# Patient Record
Sex: Female | Born: 1970 | Race: Black or African American | Hispanic: No | Marital: Single | Smoking: Never smoker
Health system: Southern US, Community
[De-identification: ages and names within clinical notes are randomized; demographics above are authoritative.]

## PROBLEM LIST (undated history)

## (undated) DIAGNOSIS — F1010 Alcohol abuse, uncomplicated: Secondary | ICD-10-CM

## (undated) DIAGNOSIS — F1994 Other psychoactive substance use, unspecified with psychoactive substance-induced mood disorder: Secondary | ICD-10-CM

## (undated) DIAGNOSIS — D649 Anemia, unspecified: Secondary | ICD-10-CM

## (undated) DIAGNOSIS — F0630 Mood disorder due to known physiological condition, unspecified: Secondary | ICD-10-CM

## (undated) DIAGNOSIS — T50901A Poisoning by unspecified drugs, medicaments and biological substances, accidental (unintentional), initial encounter: Secondary | ICD-10-CM

## (undated) DIAGNOSIS — J9600 Acute respiratory failure, unspecified whether with hypoxia or hypercapnia: Secondary | ICD-10-CM

## (undated) DIAGNOSIS — F329 Major depressive disorder, single episode, unspecified: Secondary | ICD-10-CM

## (undated) DIAGNOSIS — F32A Depression, unspecified: Secondary | ICD-10-CM

## (undated) DIAGNOSIS — T1491XA Suicide attempt, initial encounter: Secondary | ICD-10-CM

## (undated) HISTORY — PX: AUGMENTATION MAMMAPLASTY: SUR837

## (undated) HISTORY — PX: COSMETIC SURGERY: SHX468

---

## 1998-02-16 ENCOUNTER — Other Ambulatory Visit: Admission: RE | Admit: 1998-02-16 | Discharge: 1998-02-16 | Payer: Self-pay | Admitting: *Deleted

## 2010-08-14 ENCOUNTER — Emergency Department (HOSPITAL_COMMUNITY)
Admission: EM | Admit: 2010-08-14 | Discharge: 2010-08-15 | Payer: Self-pay | Source: Home / Self Care | Admitting: Emergency Medicine

## 2010-08-26 ENCOUNTER — Emergency Department (HOSPITAL_COMMUNITY): Admission: EM | Admit: 2010-08-26 | Discharge: 2010-08-26 | Payer: Self-pay | Admitting: Emergency Medicine

## 2011-01-17 LAB — COMPREHENSIVE METABOLIC PANEL
ALT: 79 U/L — ABNORMAL HIGH (ref 0–35)
AST: 109 U/L — ABNORMAL HIGH (ref 0–37)
Albumin: 4.5 g/dL (ref 3.5–5.2)
Alkaline Phosphatase: 48 U/L (ref 39–117)
BUN: 13 mg/dL (ref 6–23)
CO2: 31 mEq/L (ref 19–32)
Calcium: 9.1 mg/dL (ref 8.4–10.5)
Chloride: 99 mEq/L (ref 96–112)
Creatinine, Ser: 0.79 mg/dL (ref 0.4–1.2)
GFR calc Af Amer: >60 mL/min (ref >60)
GFR calc non Af Amer: >60 mL/min (ref >60)
Glucose, Bld: 85 mg/dL (ref 70–99)
Potassium: 3.8 mEq/L (ref 3.5–5.1)
Sodium: 142 mEq/L (ref 135–145)
Total Bilirubin: 1.9 mg/dL — ABNORMAL HIGH (ref 0.3–1.2)
Total Protein: 7.8 g/dL (ref 6.0–8.3)

## 2011-01-17 LAB — DIFFERENTIAL
Basophils Absolute: 0 10*3/uL (ref 0.0–0.1)
Basophils Relative: 0 % (ref 0–1)
Eosinophils Absolute: 0 10*3/uL (ref 0.0–0.7)
Eosinophils Relative: 0 % (ref 0–5)
Lymphocytes Relative: 22 % (ref 12–46)
Lymphs Abs: 1 10*3/uL (ref 0.7–4.0)
Monocytes Absolute: 0.5 10*3/uL (ref 0.1–1.0)
Monocytes Relative: 11 % (ref 3–12)
Neutro Abs: 3.1 10*3/uL (ref 1.7–7.7)
Neutrophils Relative %: 67 % (ref 43–77)

## 2011-01-17 LAB — ETHANOL: Alcohol, Ethyl (B): 9 mg/dL (ref 0–10)

## 2011-01-17 LAB — CBC
HCT: 36.5 % (ref 36.0–46.0)
Hemoglobin: 12.6 g/dL (ref 12.0–15.0)
MCH: 33.1 pg (ref 26.0–34.0)
MCHC: 34.5 g/dL (ref 30.0–36.0)
MCV: 95.8 fL (ref 78.0–100.0)
Platelets: 245 10*3/uL (ref 150–400)
RBC: 3.81 MIL/uL — ABNORMAL LOW (ref 3.87–5.11)
RDW: 13.2 % (ref 11.5–15.5)
WBC: 4.7 10*3/uL (ref 4.0–10.5)

## 2011-01-17 LAB — RAPID URINE DRUG SCREEN, HOSP PERFORMED
Amphetamines: NOT DETECTED
Barbiturates: NOT DETECTED
Benzodiazepines: NOT DETECTED
Cocaine: NOT DETECTED
Opiates: NOT DETECTED
Tetrahydrocannabinol: NOT DETECTED

## 2011-01-18 LAB — DIFFERENTIAL
Basophils Absolute: 0 10*3/uL (ref 0.0–0.1)
Basophils Relative: 1 % (ref 0–1)
Eosinophils Absolute: 0.1 10*3/uL (ref 0.0–0.7)
Eosinophils Relative: 2 % (ref 0–5)
Lymphocytes Relative: 50 % — ABNORMAL HIGH (ref 12–46)
Lymphs Abs: 1.7 10*3/uL (ref 0.7–4.0)
Monocytes Absolute: 0.5 10*3/uL (ref 0.1–1.0)
Monocytes Relative: 13 % — ABNORMAL HIGH (ref 3–12)
Neutro Abs: 1.2 10*3/uL — ABNORMAL LOW (ref 1.7–7.7)
Neutrophils Relative %: 35 % — ABNORMAL LOW (ref 43–77)

## 2011-01-18 LAB — RAPID URINE DRUG SCREEN, HOSP PERFORMED
Amphetamines: NOT DETECTED
Barbiturates: NOT DETECTED
Benzodiazepines: NOT DETECTED
Cocaine: NOT DETECTED
Opiates: NOT DETECTED
Tetrahydrocannabinol: NOT DETECTED

## 2011-01-18 LAB — POCT I-STAT, CHEM 8
BUN: 11 mg/dL (ref 6–23)
Calcium, Ion: 1.1 mmol/L — ABNORMAL LOW (ref 1.12–1.32)
Chloride: 116 mEq/L — ABNORMAL HIGH (ref 96–112)
Creatinine, Ser: 1.3 mg/dL — ABNORMAL HIGH (ref 0.4–1.2)
Glucose, Bld: 87 mg/dL (ref 70–99)
HCT: 33 % — ABNORMAL LOW (ref 36.0–46.0)
Hemoglobin: 11.2 g/dL — ABNORMAL LOW (ref 12.0–15.0)
Potassium: 3.6 mEq/L (ref 3.5–5.1)
Sodium: 151 mEq/L — ABNORMAL HIGH (ref 135–145)
TCO2: 21 mmol/L (ref 0–100)

## 2011-01-18 LAB — CBC
HCT: 32.4 % — ABNORMAL LOW (ref 36.0–46.0)
Hemoglobin: 10.9 g/dL — ABNORMAL LOW (ref 12.0–15.0)
MCH: 33.2 pg (ref 26.0–34.0)
MCHC: 33.6 g/dL (ref 30.0–36.0)
MCV: 98.8 fL (ref 78.0–100.0)
Platelets: 198 10*3/uL (ref 150–400)
RBC: 3.28 MIL/uL — ABNORMAL LOW (ref 3.87–5.11)
RDW: 13.1 % (ref 11.5–15.5)
WBC: 3.5 10*3/uL — ABNORMAL LOW (ref 4.0–10.5)

## 2011-01-18 LAB — BASIC METABOLIC PANEL
BUN: 9 mg/dL (ref 6–23)
CO2: 23 mEq/L (ref 19–32)
Calcium: 8.1 mg/dL — ABNORMAL LOW (ref 8.4–10.5)
Chloride: 120 mEq/L — ABNORMAL HIGH (ref 96–112)
Creatinine, Ser: 0.74 mg/dL (ref 0.4–1.2)
GFR calc Af Amer: >60 mL/min (ref >60)
GFR calc non Af Amer: >60 mL/min (ref >60)
Glucose, Bld: 88 mg/dL (ref 70–99)
Potassium: 3.7 mEq/L (ref 3.5–5.1)
Sodium: 147 mEq/L — ABNORMAL HIGH (ref 135–145)

## 2011-01-18 LAB — ETHANOL
Alcohol, Ethyl (B): 240 mg/dL — ABNORMAL HIGH (ref 0–10)
Alcohol, Ethyl (B): 347 mg/dL — ABNORMAL HIGH (ref 0–10)

## 2011-01-18 LAB — TRICYCLICS SCREEN, URINE: TCA Scrn: NOT DETECTED

## 2011-05-02 ENCOUNTER — Emergency Department (HOSPITAL_COMMUNITY)
Admission: EM | Admit: 2011-05-02 | Discharge: 2011-05-02 | Disposition: A | Discharge status: Home / Self Care | Payer: Medicaid Other | Attending: Emergency Medicine | Admitting: Emergency Medicine

## 2011-05-02 DIAGNOSIS — R110 Nausea: Secondary | ICD-10-CM | POA: Insufficient documentation

## 2011-05-02 DIAGNOSIS — K089 Disorder of teeth and supporting structures, unspecified: Secondary | ICD-10-CM | POA: Insufficient documentation

## 2011-05-02 DIAGNOSIS — K047 Periapical abscess without sinus: Secondary | ICD-10-CM | POA: Insufficient documentation

## 2011-05-02 DIAGNOSIS — R220 Localized swelling, mass and lump, head: Secondary | ICD-10-CM | POA: Insufficient documentation

## 2011-07-28 ENCOUNTER — Emergency Department (HOSPITAL_COMMUNITY)
Admission: EM | Admit: 2011-07-28 | Discharge: 2011-07-28 | Disposition: A | Discharge status: Home / Self Care | Payer: Medicaid Other | Attending: Emergency Medicine | Admitting: Emergency Medicine

## 2011-07-28 DIAGNOSIS — D72819 Decreased white blood cell count, unspecified: Secondary | ICD-10-CM | POA: Insufficient documentation

## 2011-07-28 DIAGNOSIS — F1010 Alcohol abuse, uncomplicated: Secondary | ICD-10-CM | POA: Insufficient documentation

## 2011-07-28 LAB — RAPID URINE DRUG SCREEN, HOSP PERFORMED
Amphetamines: NOT DETECTED
Barbiturates: NOT DETECTED
Benzodiazepines: NOT DETECTED
Cocaine: NOT DETECTED
Opiates: NOT DETECTED
Tetrahydrocannabinol: NOT DETECTED

## 2011-07-28 LAB — ETHANOL: Alcohol, Ethyl (B): 284 mg/dL — ABNORMAL HIGH (ref 0–11)

## 2011-07-28 LAB — CBC
HCT: 34.8 % — ABNORMAL LOW (ref 36.0–46.0)
Hemoglobin: 11.7 g/dL — ABNORMAL LOW (ref 12.0–15.0)
MCH: 32.2 pg (ref 26.0–34.0)
MCHC: 33.6 g/dL (ref 30.0–36.0)
MCV: 95.9 fL (ref 78.0–100.0)
Platelets: 253 10*3/uL (ref 150–400)
RBC: 3.63 MIL/uL — ABNORMAL LOW (ref 3.87–5.11)
RDW: 14.2 % (ref 11.5–15.5)
WBC: 3.3 10*3/uL — ABNORMAL LOW (ref 4.0–10.5)

## 2011-07-28 LAB — BASIC METABOLIC PANEL
BUN: 10 mg/dL (ref 6–23)
CO2: 24 mEq/L (ref 19–32)
Calcium: 9.1 mg/dL (ref 8.4–10.5)
Chloride: 106 mEq/L (ref 96–112)
Creatinine, Ser: 0.75 mg/dL (ref 0.50–1.10)
GFR calc Af Amer: >60 mL/min (ref >60)
GFR calc non Af Amer: >60 mL/min (ref >60)
Glucose, Bld: 103 mg/dL — ABNORMAL HIGH (ref 70–99)
Potassium: 4 mEq/L (ref 3.5–5.1)
Sodium: 144 mEq/L (ref 135–145)

## 2011-07-28 LAB — DIFFERENTIAL
Basophils Absolute: 0 10*3/uL (ref 0.0–0.1)
Basophils Relative: 0 % (ref 0–1)
Eosinophils Absolute: 0.1 10*3/uL (ref 0.0–0.7)
Eosinophils Relative: 2 % (ref 0–5)
Lymphocytes Relative: 46 % (ref 12–46)
Lymphs Abs: 1.5 10*3/uL (ref 0.7–4.0)
Monocytes Absolute: 0.3 10*3/uL (ref 0.1–1.0)
Monocytes Relative: 9 % (ref 3–12)
Neutro Abs: 1.4 10*3/uL — ABNORMAL LOW (ref 1.7–7.7)
Neutrophils Relative %: 42 % — ABNORMAL LOW (ref 43–77)

## 2011-07-28 LAB — URINALYSIS, ROUTINE W REFLEX MICROSCOPIC
Bilirubin Urine: NEGATIVE
Glucose, UA: NEGATIVE mg/dL
Hgb urine dipstick: NEGATIVE
Ketones, ur: NEGATIVE mg/dL
Leukocytes, UA: NEGATIVE
Nitrite: NEGATIVE
Protein, ur: NEGATIVE mg/dL
Specific Gravity, Urine: 1.014 (ref 1.005–1.030)
Urobilinogen, UA: 0.2 mg/dL (ref 0.0–1.0)
pH: 5.5 (ref 5.0–8.0)

## 2011-07-28 LAB — POCT PREGNANCY, URINE
Preg Test, Ur: NEGATIVE
Preg Test, Ur: NEGATIVE

## 2011-12-28 ENCOUNTER — Emergency Department (HOSPITAL_COMMUNITY)
Admission: EM | Admit: 2011-12-28 | Discharge: 2011-12-29 | Disposition: A | Discharge status: Home / Self Care | Payer: Medicaid Other | Attending: Emergency Medicine | Admitting: Emergency Medicine

## 2011-12-28 ENCOUNTER — Encounter (HOSPITAL_COMMUNITY): Payer: Self-pay | Admitting: *Deleted

## 2011-12-28 DIAGNOSIS — F329 Major depressive disorder, single episode, unspecified: Secondary | ICD-10-CM | POA: Insufficient documentation

## 2011-12-28 DIAGNOSIS — F32A Depression, unspecified: Secondary | ICD-10-CM

## 2011-12-28 DIAGNOSIS — F10929 Alcohol use, unspecified with intoxication, unspecified: Secondary | ICD-10-CM

## 2011-12-28 DIAGNOSIS — F3289 Other specified depressive episodes: Secondary | ICD-10-CM | POA: Insufficient documentation

## 2011-12-28 DIAGNOSIS — F1010 Alcohol abuse, uncomplicated: Secondary | ICD-10-CM | POA: Insufficient documentation

## 2011-12-28 HISTORY — DX: Alcohol abuse, uncomplicated: F10.10

## 2011-12-28 HISTORY — DX: Depression, unspecified: F32.A

## 2011-12-28 HISTORY — DX: Suicide attempt, initial encounter: T14.91XA

## 2011-12-28 HISTORY — DX: Major depressive disorder, single episode, unspecified: F32.9

## 2011-12-28 LAB — POCT I-STAT, CHEM 8
BUN: 15 mg/dL (ref 6–23)
Calcium, Ion: 1.05 mmol/L — ABNORMAL LOW (ref 1.12–1.32)
Chloride: 111 mEq/L (ref 96–112)
Creatinine, Ser: 1.3 mg/dL — ABNORMAL HIGH (ref 0.50–1.10)
Glucose, Bld: 93 mg/dL (ref 70–99)
HCT: 47 % — ABNORMAL HIGH (ref 36.0–46.0)
Hemoglobin: 16 g/dL — ABNORMAL HIGH (ref 12.0–15.0)
Potassium: 3.9 mEq/L (ref 3.5–5.1)
Sodium: 152 mEq/L — ABNORMAL HIGH (ref 135–145)
TCO2: 28 mmol/L (ref 0–100)

## 2011-12-28 MED ORDER — SODIUM CHLORIDE 0.9 % IV BOLUS (SEPSIS)
1000.0000 mL | Freq: Once | INTRAVENOUS | Status: AC
  Administered 2011-12-28: 1000 mL via INTRAVENOUS

## 2011-12-28 NOTE — ED Notes (Signed)
Pt presents with her mother.  Pt is not making comprehensible statements but is protecting airway well.  Pt's mother responded to a telephone call from the pt who was found to be intoxicated and lying in the back of a truck.  Pt is here tonight for detox from ETOH.  Pt's mother states that the pt has not been sober in at least 1 week and that she has a hx of binge drinking.

## 2011-12-28 NOTE — ED Provider Notes (Addendum)
History     CSN: [id number]  Arrival date & time 12/28/11  2226   First MD Initiated Contact with Patient 12/28/11 2257      Chief Complaint  Patient presents with  . Alcohol Intoxication    (Consider location/radiation/quality/duration/timing/severity/associated sxs/prior treatment) HPI Comments: Pt has longstanding hx of alcohol abuse.  Brought in tonight after mom called police when she received a phone call from pt stating that she was lying in her truck and felt like she was going to die.  Pt has told several staff members that she wants to die.  Has been through detox in past many times.  Has also had hx of depression with past suicide attempts when she was a teenager per mom.  Pt denies any physical complaints or recent injuries.  Drinks daily, usually wine.  Denies drug use.  Patient is a 40 y.o. female presenting with intoxication. The history is provided by the patient and a parent.  Alcohol Intoxication This is a recurrent problem. The current episode started 6 to 12 hours ago. The problem occurs constantly. Pertinent negatives include no chest pain, no abdominal pain, no headaches and no shortness of breath.    Past Medical History  Diagnosis Date  . Alcohol abuse   . Depression   . Suicide attempt     Past Surgical History  Procedure Date  . Cosmetic surgery     No family history on file.  History  Substance Use Topics  . Smoking status: Not on file  . Smokeless tobacco: Not on file  . Alcohol Use: Yes     last drank today an unknown amount.    OB History    Grav Para Term Preterm Abortions TAB SAB Ect Mult Living                  Review of Systems  Constitutional: Negative for fever, chills, diaphoresis and fatigue.  HENT: Negative for congestion, rhinorrhea and sneezing.   Eyes: Negative.   Respiratory: Negative for cough, chest tightness and shortness of breath.   Cardiovascular: Negative for chest pain and leg swelling.  Gastrointestinal:  Negative for nausea, vomiting, abdominal pain, diarrhea and blood in stool.  Genitourinary: Negative for frequency, hematuria, flank pain and difficulty urinating.  Musculoskeletal: Negative for back pain and arthralgias.  Skin: Negative for rash.  Neurological: Negative for dizziness, speech difficulty, weakness, numbness and headaches.    Allergies  Review of patient's allergies indicates no known allergies.  Home Medications  No current outpatient prescriptions on file.  BP 112/72  Pulse 103  Temp(Src) 98.7 F (37.1 C) (Oral)  Resp 19  SpO2 96%  LMP 09/27/2011  Physical Exam  Constitutional: She appears well-developed and well-nourished.       Sleepy, but wakes easily, no airway compromise, answers questions slowly, ETOH smell on breath  HENT:  Head: Normocephalic and atraumatic.  Eyes: Pupils are equal, round, and reactive to light.  Neck: Normal range of motion. Neck supple.  Cardiovascular: Normal rate, regular rhythm and normal heart sounds.   Pulmonary/Chest: Effort normal and breath sounds normal. No respiratory distress. She has no wheezes. She has no rales. She exhibits no tenderness.  Abdominal: Soft. Bowel sounds are normal. There is no tenderness. There is no rebound and no guarding.  Musculoskeletal: Normal range of motion. She exhibits no edema.  Lymphadenopathy:    She has no cervical adenopathy.  Neurological: She is alert. She has normal strength. She is disoriented. No cranial nerve deficit  or sensory deficit. GCS eye subscore is 4. GCS verbal subscore is 4. GCS motor subscore is 6.  Skin: Skin is warm and dry. No rash noted.  Psychiatric: She has a normal mood and affect.    ED Course  Procedures (including critical care time)  Results for orders placed during the hospital encounter of 12/28/11  ETHANOL      Component Value Range   Alcohol, Ethyl (B) 470 (*) 0 - 11 (mg/dL)  URINE RAPID DRUG SCREEN (HOSP PERFORMED)      Component Value Range    Opiates NONE DETECTED  NONE DETECTED    Cocaine NONE DETECTED  NONE DETECTED    Benzodiazepines NONE DETECTED  NONE DETECTED    Amphetamines NONE DETECTED  NONE DETECTED    Tetrahydrocannabinol NONE DETECTED  NONE DETECTED    Barbiturates NONE DETECTED  NONE DETECTED   ACETAMINOPHEN LEVEL      Component Value Range   Acetaminophen (Tylenol), Serum <15.0  10 - 30 (ug/mL)  POCT I-STAT, CHEM 8      Component Value Range   Sodium 152 (*) 135 - 145 (mEq/L)   Potassium 3.9  3.5 - 5.1 (mEq/L)   Chloride 111  96 - 112 (mEq/L)   BUN 15  6 - 23 (mg/dL)   Creatinine, Ser 1.61 (*) 0.50 - 1.10 (mg/dL)   Glucose, Bld 93  70 - 99 (mg/dL)   Calcium, Ion 0.96 (*) 1.12 - 1.32 (mmol/L)   TCO2 28  0 - 100 (mmol/L)   Hemoglobin 16.0 (*) 12.0 - 15.0 (g/dL)   HCT 04.5 (*) 40.9 - 46.0 (%)  POCT PREGNANCY, URINE      Component Value Range   Preg Test, Ur NEGATIVE  NEGATIVE     Date: 12/29/2011  Rate: 89  Rhythm: normal sinus rhythm  QRS Axis: normal  Intervals: normal  ST/T Wave abnormalities: normal and nonspecific ST changes  Conduction Disutrbances:none  Narrative Interpretation:   Old EKG Reviewed: none available   No results found.   1. Alcohol intoxication   2. Depression       MDM  Pt intoxicated.  Will wait until more sober and reevaluate for SI/self harm.  CIWA protocol initiated.        Rolan Bucco, MD 12/29/11 8119  Rolan Bucco, MD 12/29/11 [id number]

## 2011-12-28 NOTE — ED Notes (Signed)
MD at bedside. 

## 2011-12-28 NOTE — ED Notes (Signed)
Pt is cussing and verbally abusive at triage window while checking in.

## 2011-12-28 NOTE — ED Notes (Signed)
Per Mother: Pt has been intoxicated for 1 week, worse today. Pt came to ED with vomit and urine on clothing. Impulsive but reorients and cooperates. Mother at bedside. No other complaint.

## 2011-12-29 LAB — RAPID URINE DRUG SCREEN, HOSP PERFORMED
Amphetamines: NOT DETECTED
Barbiturates: NOT DETECTED
Benzodiazepines: NOT DETECTED
Cocaine: NOT DETECTED
Opiates: NOT DETECTED
Tetrahydrocannabinol: NOT DETECTED

## 2011-12-29 LAB — POCT I-STAT, CHEM 8
BUN: 13 mg/dL (ref 6–23)
Calcium, Ion: 0.98 mmol/L — ABNORMAL LOW (ref 1.12–1.32)
Chloride: 112 mEq/L (ref 96–112)
Creatinine, Ser: 1.2 mg/dL — ABNORMAL HIGH (ref 0.50–1.10)
Glucose, Bld: 94 mg/dL (ref 70–99)
HCT: 35 % — ABNORMAL LOW (ref 36.0–46.0)
Hemoglobin: 11.9 g/dL — ABNORMAL LOW (ref 12.0–15.0)
Potassium: 3.7 mEq/L (ref 3.5–5.1)
Sodium: 148 mEq/L — ABNORMAL HIGH (ref 135–145)
TCO2: 24 mmol/L (ref 0–100)

## 2011-12-29 LAB — ACETAMINOPHEN LEVEL: Acetaminophen (Tylenol), Serum: <15 ug/mL (ref 10–30)

## 2011-12-29 LAB — ETHANOL
Alcohol, Ethyl (B): 180 mg/dL — ABNORMAL HIGH (ref 0–11)
Alcohol, Ethyl (B): 329 mg/dL — ABNORMAL HIGH (ref 0–11)
Alcohol, Ethyl (B): 470 mg/dL (ref 0–11)

## 2011-12-29 LAB — POCT PREGNANCY, URINE: Preg Test, Ur: NEGATIVE

## 2011-12-29 MED ORDER — ADULT MULTIVITAMIN W/MINERALS CH
1.0000 | ORAL_TABLET | Freq: Every day | ORAL | Status: DC
  Administered 2011-12-29: 1 via ORAL
  Filled 2011-12-29 (×3): qty 1

## 2011-12-29 MED ORDER — THIAMINE HCL 100 MG/ML IJ SOLN: 100.0000 mg | Freq: Every day | INTRAMUSCULAR | Status: DC

## 2011-12-29 MED ORDER — LORAZEPAM 2 MG/ML IJ SOLN
1.0000 mg | Freq: Four times a day (QID) | INTRAMUSCULAR | Status: DC | PRN
  Administered 2011-12-29 (×2): 1 mg via INTRAVENOUS
  Filled 2011-12-29 (×2): qty 1

## 2011-12-29 MED ORDER — ONDANSETRON HCL 4 MG/2ML IJ SOLN
4.0000 mg | Freq: Four times a day (QID) | INTRAMUSCULAR | Status: DC | PRN
  Administered 2011-12-29 (×2): 4 mg via INTRAVENOUS
  Filled 2011-12-29 (×2): qty 2

## 2011-12-29 MED ORDER — VITAMIN B-1 100 MG PO TABS
100.0000 mg | ORAL_TABLET | Freq: Every day | ORAL | Status: DC
  Administered 2011-12-29: 100 mg via ORAL
  Filled 2011-12-29: qty 1

## 2011-12-29 MED ORDER — LORAZEPAM 1 MG PO TABS
1.0000 mg | ORAL_TABLET | Freq: Four times a day (QID) | ORAL | Status: DC | PRN
  Filled 2011-12-29: qty 1

## 2011-12-29 MED ORDER — FOLIC ACID 1 MG PO TABS
1.0000 mg | ORAL_TABLET | Freq: Every day | ORAL | Status: DC
  Administered 2011-12-29: 1 mg via ORAL
  Filled 2011-12-29: qty 1

## 2011-12-29 NOTE — ED Notes (Signed)
Notified and faxed appropiate information to [facility] specialist on call for Tele Psych consult. Awaiting return call from Dr.Baralt.

## 2011-12-29 NOTE — ED Notes (Signed)
Patient complaining of nausea, which she has not had since last night will administer Zofran and monitor and proceed with discharge.

## 2011-12-29 NOTE — ED Notes (Signed)
Tele Psych consult complete.

## 2011-12-29 NOTE — ED Notes (Signed)
Discharge instructions reviewed with patient. Awaiting on her mother for transportation.

## 2011-12-29 NOTE — ED Notes (Signed)
All belongings sent home with pt's mother

## 2011-12-29 NOTE — BH Assessment (Signed)
Assessment Note   Kelsey [name] is an 41 y.o. female.   Pt was a cooperative pt during assessment, but did not make a lot of eye contact due to light and being "tired".  Pt answered questions appropriately and although her speech was slurred and slow pt was Ox3 and engaged in conversation.  Pt insight is poor in that she sights the rational for using alcohol lately is related to "boredom", but would not elaborate.  Pt mother indicated "I have noticed a change over the last 3 weeks and wonder if something happened to trigger this latest relapse."  The mother played for this Counselor voice mail from last night in which pt called her mother to come get her from a parking lot where she was found in her truck intoxicated.  Pt reports "I don't really remember anything from last night, I was drunk."  Pt has long hx of SA issues and detox placements.  Pt reports she is not interested in returning to these facilities at this time.  Pt mother wants her to get help because of her driving around the city intoxicated and her fear that someone may get killed or the pt may get killed due to driving under the influence.  Pt denies SI,HI and AVH.  Pt lives with her mother and can return.  Pt mother reports pt vomits on her furniture, floor and bed routinely and "cannot really care for herself."  Pt child lives with the child's father and mother reports "child is safe."  Tele Psych ordered due to complexities of case.  Dr. Ignacia Palma is in agreement.  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 Alcohol abuse   . Depression   . Suicide attempt    Axis IV: economic problems, other psychosocial or environmental problems, problems related to social environment and problems with primary support group Axis V: 41-50 serious symptoms  Past Medical History:  Past Medical History  Diagnosis Date  . Alcohol abuse   . Depression   . Suicide  attempt     Past Surgical History  Procedure Date  . Cosmetic surgery     Family History: No family history on file.  Social History:  does not have a smoking history on file. She does not have any smokeless tobacco history on file. She reports that she drinks alcohol. She reports that she does not use illicit drugs.  Additional Social History:  Alcohol / Drug Use Pain Medications: none Prescriptions: none Over the Counter: none History of alcohol / drug use?: Yes Longest period of sobriety (when/how long): 9 mths 2000 Negative Consequences of Use: Financial;Personal relationships;Work / Mining engineer #1 Name of Substance 1: alcohol 1 - Age of First Use: 16 1 - Amount (size/oz): 4 bottles of wine - "until I throw up" 1 - Frequency: daily - 7 day binge currently 1 - Duration: 7 day binge 1 - Last Use / Amount: 4 bottles / 12-29-11 Allergies: No Known Allergies  Home Medications:  Medications Prior to Admission  Medication Dose Route Frequency Provider Last Rate Last Dose  . folic acid (FOLVITE) tablet 1 mg  1 mg Oral Daily Rolan Bucco, MD      . LORazepam (ATIVAN) tablet 1 mg  1 mg Oral Q6H PRN Rolan Bucco, MD       Or  . LORazepam (ATIVAN) injection 1 mg  1 mg Intravenous Q6H PRN Rolan Bucco, MD  1 mg at 12/29/11 0344  . mulitivitamin with minerals tablet 1 tablet  1 tablet Oral Daily Rolan Bucco, MD      . ondansetron (ZOFRAN) injection 4 mg  4 mg Intravenous Q6H PRN Rolan Bucco, MD   4 mg at 12/29/11 0344  . sodium chloride 0.9 % bolus 1,000 mL  1,000 mL Intravenous Once Rolan Bucco, MD   1,000 mL at 12/28/11 2317  . thiamine (VITAMIN B-1) tablet 100 mg  100 mg Oral Daily Rolan Bucco, MD       Or  . thiamine (B-1) injection 100 mg  100 mg Intravenous Daily Rolan Bucco, MD       Medications Prior to Admission  Medication Sig Dispense Refill  . desvenlafaxine (PRISTIQ) 50 MG 24 hr tablet Take 50 mg by mouth daily.        OB/GYN Status:  Patient's last  menstrual period was 09/27/2011.  General Assessment Data Location of Assessment: [facility] ED ACT Assessment: Yes Living Arrangements: Parent Can pt return to current living arrangement?: Yes Admission Status: Voluntary Is patient capable of signing voluntary admission?: Yes Transfer from: Acute Hospital Referral Source: MD  Education Status Is patient currently in school?: No  Risk to self Suicidal Ideation: No-Not Currently/Within Last 6 Months Suicidal Intent: No-Not Currently/Within Last 6 Months Is patient at risk for suicide?: No (pt consumes alcohol and makes irrational decisions) Suicidal Plan?: No-Not Currently/Within Last 6 Months Access to Means: Yes Specify Access to Suicidal Means: can drive, has truck; access to meds What has been your use of drugs/alcohol within the last 12 months?: binge drinks; Blood Alcohol 470 Previous Attempts/Gestures: Yes How many times?: 2  Other Self Harm Risks: cut marks on arms per mother Triggers for Past Attempts: Unpredictable Intentional Self Injurious Behavior: Cutting (per mother; ACT did not see marks on arms) Comment - Self Injurious Behavior: pt denies SI; mother believes pt is a danger to self and others Family Suicide History: No Recent stressful life event(s): Conflict (Comment);Other (Comment) (per mother, last 2 wks has shown a change; pt denies ) Persecutory voices/beliefs?: No Depression: Yes Depression Symptoms: Isolating;Fatigue;Guilt;Loss of interest in usual pleasures;Feeling worthless/self pity;Feeling angry/irritable Substance abuse history and/or treatment for substance abuse?: Yes Suicide prevention information given to non-admitted patients: Yes  Risk to Others Homicidal Ideation: No Thoughts of Harm to Others: No Current Homicidal Intent: No Current Homicidal Plan: No Access to Homicidal Means: No Identified Victim: no History of harm to others?: No Assessment of Violence: None Noted Violent Behavior  Description: no Does patient have access to weapons?: No Criminal Charges Pending?: No Does patient have a court date: No  Psychosis Hallucinations: None noted Delusions: None noted  Mental Status Report Appear/Hygiene: Disheveled Eye Contact: Poor Motor Activity: Unsteady Speech: Slurred;Slow;Logical/coherent Level of Consciousness: Quiet/awake;Drowsy Mood: Depressed;Sad Affect: Depressed;Anxious Anxiety Level: Minimal Thought Processes: Coherent Judgement: Impaired Orientation: Person;Place;Situation Obsessive Compulsive Thoughts/Behaviors: None  Cognitive Functioning Concentration: Decreased Memory: Recent Impaired;Remote Intact IQ: Average Insight: Poor Impulse Control: Poor Appetite: Good ("I gained 10lbs") Weight Loss: 0  Weight Gain: 10  Sleep: No Change ("I sleep real good with a bottle of wine or two") Total Hours of Sleep: 10  Vegetative Symptoms: Decreased grooming  Prior Inpatient Therapy Prior Inpatient Therapy: Yes Prior Therapy Dates: 12/12 Prior Therapy Facilty/Provider(s): [facility] for detox Reason for Treatment: detox  Prior Outpatient Therapy Prior Outpatient Therapy: Yes Prior Therapy Dates: 2012 ("I don't really remember") Prior Therapy Facilty[facility]Provider(s): [facility] ("I don't remember the name  right now") Reason for Treatment: optx  ADL Screening (condition at time of admission) Patient's cognitive ability adequate to safely complete daily activities?: Yes Patient able to express need for assistance with ADLs?: Yes Independently performs ADLs?: Yes (unsteady gait due to alcohol abuse) Weakness of Legs: None Weakness of Arms/Hands: None  Home Assistive Devices/Equipment Home Assistive Devices/Equipment: None  Therapy Consults (therapy consults require a physician order) PT Evaluation Needed: No OT Evalulation Needed: No SLP Evaluation Needed: No Abuse/Neglect Assessment (Assessment to be complete while patient is  alone) Physical Abuse: Denies Verbal Abuse: Denies Sexual Abuse: Denies Exploitation of patient/patient's resources: Denies Values / Beliefs Cultural Requests During Hospitalization: None Spiritual Requests During Hospitalization: None Consults Spiritual Care Consult Needed: No Social Work Consult Needed: No Merchant navy officer (For Healthcare) Advance Directive: Patient does not have advance directive Pre-existing out of facility DNR order (yellow form or pink MOST form): No Nutrition Screen Diet: NPO Unintentional weight loss greater than 10lbs within the last month: No Dysphagia: No Home Tube Feeding or Total Parenteral Nutrition (TPN): No Patient appears severely malnourished: No Pregnant or Lactating: No Dietitian Consult Needed: No  Additional Information 1:1 In Past 12 Months?: No CIRT Risk: No Elopement Risk: No Does patient have medical clearance?: No (pt BAL is still high and being monitored)     Disposition:   Pt wants to leave.  Pt understands she was intoxicated when she arrived and said "wild things on my mom's phone."  Pt BAL was 470 on arrival.  Pt mother believes pt is a danger to self and others due to driving while drinking.  Pt lives with mother.  Tele Psych ordered to determine dispo.  Disposition Disposition of Patient: Other dispositions (Tele Psych ordered; mother concerned; pt wants to leave) Other disposition(s): Other (Comment) (Tele psych ordered; EDP in agreement)  On Site Evaluation by:   Reviewed with Physician:     Titus Mould, Eppie Gibson 12/29/2011 11:17 AM

## 2011-12-29 NOTE — ED Notes (Signed)
Patient is resting comfortably. 

## 2011-12-29 NOTE — ED Notes (Signed)
Ready for discharge but waiting for mother to bring clothing and provide transportation.

## 2011-12-29 NOTE — ED Provider Notes (Signed)
Telepsych consultant Henderson Cloud) notes that the patient is appropriate for d/c from ED with outpatient etoh counseling and psychiatry follow-up.  Patient's VS on D/C - unremarkable.  Gerhard Munch, MD 12/29/11 [phone]

## 2011-12-29 NOTE — B[facility] Counselor (Signed)
Dr. Ignacia Palma will order Tele Psych.  Nurse will complete paper work and take to MD to review and sign.  Tele Psych to determine dispo.  Pt wants to leave.  Mother wants pt to get help.  Issues revolve around pt driving around city intoxicated in her newly purchased truck.

## 2011-12-29 NOTE — ED Notes (Signed)
Notified Dr. Ignacia Palma of patients blood alcohol level. Will Notify Tele Psych MD that levels are within range to proceed  with consultation.

## 2011-12-29 NOTE — ED Provider Notes (Cosign Needed)
8:05 AM Chart reviewed.  Pt with high initial ETOH of 470.  Currently sound asleep.  Repeat ISTAT8 and ETOH ordered. Carleene Cooper III, M.D.    11:02 AM ETOH down to 329.  Telepsych consult requested. Osvaldo Human, M.D.  1:13 PM I discussed pt's situation with her.  She had been quite intoxicated last night.  She admits to making several frantic calls and voicemails to her mother in her intoxicated state.  I spoke to the telepsychiatrist, who wanted to be called when pt's ETOH is less than 200. Osvaldo Human, M.D.   Appx 4:30 P.M. Case discussed with Dr. Henderson Cloud, who felt pt could go home safely at this time.  Dr. Jeraldine Loots will discharge her.  Osvaldo Human, M.D.  Carleene Cooper III, MD 12/29/11 [phone]

## 2011-12-29 NOTE — ED Notes (Signed)
Notified Specialist on call to speak to Tele Psych MD . Awaiting return call.

## 2011-12-29 NOTE — Discharge Instructions (Signed)
Reason make sure to follow up with both your psychiatrist, and the outpatient treatment center.  If you develop any new, or concerning changes in your condition, or any new concerning thoughts, including thoughts of hurting herself or others, please return to emergency department immediately.Finding Treatment for Alcohol and Drug Addiction It can be hard to find the right place to get professional treatment. Here are some important things to consider:  There are different types of treatment to choose from.   Some programs are live-in (residential) while others are not (outpatient). Sometimes a combination is offered.   No single type of program is right for everyone.   Most treatment programs involve a combination of education, counseling, and a 12-step, spiritually-based approach.   There are non-spiritually based programs (not 12-step).   Some treatment programs are government sponsored. They are geared for patients without private insurance.   Treatment programs can vary in many respects such as:   Cost and types of insurance accepted.   Types of on-site medical services offered.   Length of stay, setting, and size.   Overall philosophy of treatment.  A person may need specialized treatment or have needs not addressed by all programs. For example, adolescents need treatment appropriate for their age. Other people have secondary disorders that must be managed as well. Secondary conditions can include mental illness, such as depression or diabetes. Often, a period of detoxification from alcohol or drugs is needed. This requires medical supervision and not all programs offer this. THINGS TO CONSIDER WHEN SELECTING A TREATMENT PROGRAM   Is the program certified by the appropriate government agency? Even private programs must be certified and employ certified professionals.   Does the program accept your insurance? If not, can a payment plan be set up?   Is the facility clean,  organized, and well run? Do they allow you to speak with graduates who can share their treatment experience with you? Can you tour the facility? Can you meet with staff?   Does the program meet the full range of individual needs?   Does the treatment program address sexual orientation and physical disabilities? Do they provide age, gender, and culturally appropriate treatment services?   Is treatment available in languages other than English?   Is long-term aftercare support or guidance encouraged and provided?   Is assessment of an individual's treatment plan ongoing to ensure it meets changing needs?   Does the program use strategies to encourage reluctant patients to remain in treatment long enough to increase the likelihood of success?   Does the program offer counseling (individual or group) and other behavioral therapies?   Does the program offer medicine as part of the treatment regimen, if needed?   Is there ongoing monitoring of possible relapse? Is there a defined relapse prevention program? Are services or referrals offered to family members to ensure they understand addiction and the recovery process? This would help them support the recovering individual.   Are 12-step meetings held at the center or is transport available for patients to attend outside meetings?  In countries outside of the [place]. and [place], Magazine features editor for contact information for services in your area. Document Released: 09/20/2005 Document Revised: 07/04/2011 Document Reviewed: 04/01/2008 [facility] Patient Information 2012 [place], [place].Alcohol Intoxication Alcohol intoxication means your blood alcohol level is above legal limits. Alcohol is a drug. It has serious side effects. These side effects can include:  Damage to your organs (liver, nervous system, and blood system).   Unclear thinking.  Slowed reflexes.   Decreased muscle coordination.  HOME CARE  Do not drink and drive.   Do not  drink alcohol if you are taking medicine or using other drugs. Doing so can cause serious medical problems or even death.   Drink enough water and fluids to keep your pee (urine) clear or pale yellow.   Eat healthy foods.   Only take medicine as told by your doctor.   Join an alcohol support group.  GET HELP RIGHT AWAY IF:  You become shaky when you stop drinking.   Your thinking is unclear or you become confused.   You throw up (vomit) blood. It may look bright red or like coffee grounds.   You notice blood in your poop (bowel movements).   You become lightheaded or pass out (faint).  MAKE SURE YOU:   Understand these instructions.   Will watch your condition.   Will get help right away if you are not doing well or get worse.  Document Released: 04/09/2008 Document Revised: 07/04/2011 Document Reviewed: 04/10/2010 [facility] Patient Information 2012 [place], [place].

## 2012-03-14 ENCOUNTER — Other Ambulatory Visit: Payer: Self-pay | Admitting: Specialist

## 2012-03-14 DIAGNOSIS — R945 Abnormal results of liver function studies: Secondary | ICD-10-CM

## 2012-03-18 ENCOUNTER — Ambulatory Visit
Admission: RE | Admit: 2012-03-18 | Discharge: 2012-03-18 | Disposition: A | Discharge status: Home / Self Care | Payer: Medicaid Other | Source: Ambulatory Visit | Attending: Specialist | Admitting: Specialist

## 2012-03-18 DIAGNOSIS — R945 Abnormal results of liver function studies: Secondary | ICD-10-CM

## 2012-04-16 ENCOUNTER — Emergency Department (HOSPITAL_COMMUNITY)
Admission: EM | Admit: 2012-04-16 | Discharge: 2012-04-16 | Disposition: A | Discharge status: Home / Self Care | Payer: Medicaid Other | Attending: Emergency Medicine | Admitting: Emergency Medicine

## 2012-04-16 DIAGNOSIS — F10929 Alcohol use, unspecified with intoxication, unspecified: Secondary | ICD-10-CM

## 2012-04-16 DIAGNOSIS — R45851 Suicidal ideations: Secondary | ICD-10-CM | POA: Insufficient documentation

## 2012-04-16 DIAGNOSIS — F1010 Alcohol abuse, uncomplicated: Secondary | ICD-10-CM | POA: Insufficient documentation

## 2012-04-16 LAB — BASIC METABOLIC PANEL
BUN: 9 mg/dL (ref 6–23)
CO2: 31 mEq/L (ref 19–32)
Calcium: 9 mg/dL (ref 8.4–10.5)
Chloride: 103 mEq/L (ref 96–112)
Creatinine, Ser: 0.82 mg/dL (ref 0.50–1.10)
GFR calc Af Amer: >90 mL/min (ref >90)
GFR calc non Af Amer: 88 mL/min — ABNORMAL LOW (ref >90)
Glucose, Bld: 96 mg/dL (ref 70–99)
Potassium: 3.9 mEq/L (ref 3.5–5.1)
Sodium: 144 mEq/L (ref 135–145)

## 2012-04-16 LAB — RAPID URINE DRUG SCREEN, HOSP PERFORMED
Amphetamines: NOT DETECTED
Barbiturates: NOT DETECTED
Benzodiazepines: NOT DETECTED
Cocaine: NOT DETECTED
Opiates: NOT DETECTED
Tetrahydrocannabinol: NOT DETECTED

## 2012-04-16 LAB — URINALYSIS, ROUTINE W REFLEX MICROSCOPIC
Bilirubin Urine: NEGATIVE
Glucose, UA: NEGATIVE mg/dL
Ketones, ur: NEGATIVE mg/dL
Leukocytes, UA: NEGATIVE
Nitrite: NEGATIVE
Protein, ur: NEGATIVE mg/dL
Specific Gravity, Urine: 1.01 (ref 1.005–1.030)
Urobilinogen, UA: 0.2 mg/dL (ref 0.0–1.0)
pH: 7 (ref 5.0–8.0)

## 2012-04-16 LAB — CBC
HCT: 38 % (ref 36.0–46.0)
Hemoglobin: 12.8 g/dL (ref 12.0–15.0)
MCH: 32.6 pg (ref 26.0–34.0)
MCHC: 33.7 g/dL (ref 30.0–36.0)
MCV: 96.7 fL (ref 78.0–100.0)
Platelets: 253 10*3/uL (ref 150–400)
RBC: 3.93 MIL/uL (ref 3.87–5.11)
RDW: 14.4 % (ref 11.5–15.5)
WBC: 6.1 10*3/uL (ref 4.0–10.5)

## 2012-04-16 LAB — URINE MICROSCOPIC-ADD ON

## 2012-04-16 LAB — ETHANOL: Alcohol, Ethyl (B): 390 mg/dL — ABNORMAL HIGH (ref 0–11)

## 2012-04-16 LAB — PREGNANCY, URINE: Preg Test, Ur: NEGATIVE

## 2012-04-16 NOTE — ED Notes (Signed)
Per EMS: PD requested EMS to come for ETOH and suicidal thoughts.  Denies HI.  Denies attempt.  Pt currently stating "It would have been easier just to jump off a bridge".

## 2012-04-16 NOTE — ED Notes (Signed)
Pt changed into paper scrubs, security came to wand pt. Pt belongings are locked in her room in lower cabinet.

## 2012-04-16 NOTE — ED Notes (Signed)
Pt states that her mom kicked her out today so she got drunk and wanted to kill herself.  Pt states someone saw her crying in her car and called 911.  Police requested EMS to come for pt to be evaluated.  Pt states that she was feeling suicidal when she was drunk but "I'm sober now so now I don't want to hurt myself".  Denies HI.  Hx of alcoholism & depression w/ suicide attempts in the past by cutting wrists.

## 2012-04-16 NOTE — ED Notes (Signed)
ION:[id number]<MW> Expected date:<BR> Expected time:<BR> Means of arrival:<BR> Comments:<BR> etoh

## 2012-04-16 NOTE — ED Notes (Addendum)
Pt belonging RN locked them in  pt room

## 2012-04-16 NOTE — ED Provider Notes (Signed)
History     CSN: [id number]  Arrival date & time 04/16/12  1014   First MD Initiated Contact with Patient 04/16/12 1021      Chief Complaint  Patient presents with  . Alcohol Intoxication  . Suicidal    The history is provided by the patient. History Limited By: Level V caveat: Intoxication, uncooperative.   the patient reports she's an alcoholic and drinks daily.  She reports she's been drinking heavily for the past several days.  His reported that the police found her and asked him as to the emergency department for alcohol intoxication and questionable suicidal thoughts.  The patient reports she did have thoughts of suicide but doesn't any more.  She has a sober up in the emergency department.  She denies chest pain or shortness breath.  She has no abdominal pain.  She'll not provide additional history  Past Medical History  Diagnosis Date  . Alcohol abuse   . Depression   . Suicide attempt     Past Surgical History  Procedure Date  . Cosmetic surgery     No family history on file.  History  Substance Use Topics  . Smoking status: Not on file  . Smokeless tobacco: Not on file  . Alcohol Use: Yes     last drank today an unknown amount.    OB History    Grav Para Term Preterm Abortions TAB SAB Ect Mult Living                  Review of Systems  Unable to perform ROS   Allergies  Review of patient's allergies indicates no known allergies.  Home Medications  No current outpatient prescriptions on file.  BP 119/78  Pulse 92  Temp 97.1 F (36.2 C) (Oral)  Resp 12  SpO2 97%  LMP 04/13/2012  Physical Exam  Nursing note and vitals reviewed. Constitutional: She appears well-developed and well-nourished. No distress.  HENT:  Head: Normocephalic and atraumatic.  Eyes: EOM are normal.  Neck: Normal range of motion.  Cardiovascular: Normal rate, regular rhythm and normal heart sounds.   Pulmonary/Chest: Effort normal and breath sounds normal.  Abdominal:  Soft. She exhibits no distension. There is no tenderness.  Musculoskeletal: Normal range of motion.  Neurological: She is alert.  Skin: Skin is warm and dry.  Psychiatric: She has a normal mood and affect. Judgment normal.    ED Course  Procedures (including critical care time)  Labs Reviewed  BASIC METABOLIC PANEL - Abnormal; Notable for the following:    GFR calc non Af Amer 88 (*)     All other components within normal limits  ETHANOL - Abnormal; Notable for the following:    Alcohol, Ethyl (B) 390 (*)     All other components within normal limits  URINALYSIS, ROUTINE W REFLEX MICROSCOPIC - Abnormal; Notable for the following:    Hgb urine dipstick MODERATE (*)     All other components within normal limits  URINE MICROSCOPIC-ADD ON - Abnormal; Notable for the following:    Squamous Epithelial / LPF FEW (*)     Bacteria, UA FEW (*)     All other components within normal limits  CBC  URINE RAPID DRUG SCREEN (HOSP PERFORMED)  PREGNANCY, URINE   No results found.   1. Alcohol intoxication       MDM  At this time basic labs we obtained as well as urine samples.  She will be allowed to sober up in the  emergency department I will readdress her depression and suicidal thoughts.   4:47 PM The patient is clinically sober at this time.  She denies homicidal or suicidal thoughts.  She reports she just drank too much.  She does not want help with her drinking right now she thinks she can stop on her own.  Given her recommendations to contact to outpatient resources to help with her alcohol addiction     Lyanne Co, MD 04/16/12 [phone]

## 2012-04-16 NOTE — Discharge Instructions (Signed)
Finding Treatment for Alcohol and Drug Addiction   It can be hard to find the right place to get professional treatment. Here are some important things to consider:   There are different types of treatment to choose from.   Some programs are live-in (residential) while others are not (outpatient). Sometimes a combination is offered.   No single type of program is right for everyone.   Most treatment programs involve a combination of education, counseling, and a 12-step, spiritually-based approach.   There are non-spiritually based programs (not 12-step).   Some treatment programs are government sponsored. They are geared for patients without private insurance.   Treatment programs can vary in many respects such as:   Cost and types of insurance accepted.   Types of on-site medical services offered.   Length of stay, setting, and size.   Overall philosophy of treatment.   A person may need specialized treatment or have needs not addressed by all programs. For example, adolescents need treatment appropriate for their age. Other people have secondary disorders that must be managed as well. Secondary conditions can include mental illness, such as depression or diabetes. Often, a period of detoxification from alcohol or drugs is needed. This requires medical supervision and not all programs offer this.   THINGS TO CONSIDER WHEN SELECTING A TREATMENT PROGRAM   Is the program certified by the appropriate government agency? Even private programs must be certified and employ certified professionals.   Does the program accept your insurance? If not, can a payment plan be set up?   Is the facility clean, organized, and well run? Do they allow you to speak with graduates who can share their treatment experience with you? Can you tour the facility? Can you meet with staff?   Does the program meet the full range of individual needs?   Does the treatment program address sexual orientation and physical disabilities? Do they provide  age, gender, and culturally appropriate treatment services?   Is treatment available in languages other than English?   Is long-term aftercare support or guidance encouraged and provided?   Is assessment of an individual's treatment plan ongoing to ensure it meets changing needs?   Does the program use strategies to encourage reluctant patients to remain in treatment long enough to increase the likelihood of success?   Does the program offer counseling (individual or group) and other behavioral therapies?   Does the program offer medicine as part of the treatment regimen, if needed?   Is there ongoing monitoring of possible relapse? Is there a defined relapse prevention program? Are services or referrals offered to family members to ensure they understand addiction and the recovery process? This would help them support the recovering individual.   Are 12-step meetings held at the center or is transport available for patients to attend outside meetings?  In countries outside of the [place], see local directories for contact information for services in your area.   Document Released: 09/20/2005 Document Revised: 10/11/2011 Document Reviewed: 04/01/2008   Exit[employer]® Patient Information ©2012 [employer].

## 2012-04-18 ENCOUNTER — Emergency Department (HOSPITAL_COMMUNITY)
Admission: EM | Admit: 2012-04-18 | Discharge: 2012-04-19 | Disposition: A | Discharge status: Home / Self Care | Payer: Medicaid Other | Attending: Emergency Medicine | Admitting: Emergency Medicine

## 2012-04-18 ENCOUNTER — Encounter (HOSPITAL_COMMUNITY): Payer: Self-pay | Admitting: Physical Medicine and Rehabilitation

## 2012-04-18 DIAGNOSIS — F1010 Alcohol abuse, uncomplicated: Secondary | ICD-10-CM | POA: Insufficient documentation

## 2012-04-18 DIAGNOSIS — F10929 Alcohol use, unspecified with intoxication, unspecified: Secondary | ICD-10-CM

## 2012-04-18 DIAGNOSIS — F329 Major depressive disorder, single episode, unspecified: Secondary | ICD-10-CM | POA: Insufficient documentation

## 2012-04-18 DIAGNOSIS — F3289 Other specified depressive episodes: Secondary | ICD-10-CM | POA: Insufficient documentation

## 2012-04-18 LAB — ETHANOL: Alcohol, Ethyl (B): 307 mg/dL — ABNORMAL HIGH (ref 0–11)

## 2012-04-18 LAB — POCT PREGNANCY, URINE: Preg Test, Ur: NEGATIVE

## 2012-04-18 LAB — COMPREHENSIVE METABOLIC PANEL
ALT: 29 U/L (ref 0–35)
AST: 36 U/L (ref 0–37)
Albumin: 3.8 g/dL (ref 3.5–5.2)
Alkaline Phosphatase: 44 U/L (ref 39–117)
BUN: 14 mg/dL (ref 6–23)
CO2: 28 mEq/L (ref 19–32)
Calcium: 8.7 mg/dL (ref 8.4–10.5)
Chloride: 103 mEq/L (ref 96–112)
Creatinine, Ser: 0.85 mg/dL (ref 0.50–1.10)
GFR calc Af Amer: >90 mL/min (ref >90)
GFR calc non Af Amer: 85 mL/min — ABNORMAL LOW (ref >90)
Glucose, Bld: 75 mg/dL (ref 70–99)
Potassium: 3.9 mEq/L (ref 3.5–5.1)
Sodium: 144 mEq/L (ref 135–145)
Total Bilirubin: 0.5 mg/dL (ref 0.3–1.2)
Total Protein: 7.1 g/dL (ref 6.0–8.3)

## 2012-04-18 LAB — CBC
HCT: 36.6 % (ref 36.0–46.0)
Hemoglobin: 12.4 g/dL (ref 12.0–15.0)
MCH: 32.5 pg (ref 26.0–34.0)
MCHC: 33.9 g/dL (ref 30.0–36.0)
MCV: 95.8 fL (ref 78.0–100.0)
Platelets: 231 10*3/uL (ref 150–400)
RBC: 3.82 MIL/uL — ABNORMAL LOW (ref 3.87–5.11)
RDW: 14.6 % (ref 11.5–15.5)
WBC: 5.2 10*3/uL (ref 4.0–10.5)

## 2012-04-18 LAB — RAPID URINE DRUG SCREEN, HOSP PERFORMED
Amphetamines: NOT DETECTED
Barbiturates: NOT DETECTED
Benzodiazepines: NOT DETECTED
Cocaine: NOT DETECTED
Opiates: NOT DETECTED
Tetrahydrocannabinol: NOT DETECTED

## 2012-04-18 LAB — ACETAMINOPHEN LEVEL: Acetaminophen (Tylenol), Serum: <15 ug/mL (ref 10–30)

## 2012-04-18 MED ORDER — ONDANSETRON HCL 8 MG PO TABS
8.0000 mg | ORAL_TABLET | Freq: Three times a day (TID) | ORAL | Status: DC | PRN
  Administered 2012-04-18: 8 mg via ORAL
  Filled 2012-04-18: qty 1

## 2012-04-18 MED ORDER — THIAMINE HCL 100 MG/ML IJ SOLN: 100.0000 mg | Freq: Every day | INTRAMUSCULAR | Status: DC

## 2012-04-18 MED ORDER — FOLIC ACID 1 MG PO TABS
1.0000 mg | ORAL_TABLET | Freq: Every day | ORAL | Status: DC
  Administered 2012-04-18: 1 mg via ORAL
  Filled 2012-04-18: qty 1

## 2012-04-18 MED ORDER — ADULT MULTIVITAMIN W/MINERALS CH
1.0000 | ORAL_TABLET | Freq: Every day | ORAL | Status: DC
  Filled 2012-04-18: qty 1

## 2012-04-18 MED ORDER — VITAMIN B-1 100 MG PO TABS
100.0000 mg | ORAL_TABLET | Freq: Once | ORAL | Status: AC
  Administered 2012-04-18: 100 mg via ORAL
  Filled 2012-04-18: qty 1

## 2012-04-18 MED ORDER — ADULT MULTIVITAMIN W/MINERALS CH
1.0000 | ORAL_TABLET | Freq: Once | ORAL | Status: AC
  Administered 2012-04-18: 1 via ORAL
  Filled 2012-04-18: qty 1

## 2012-04-18 MED ORDER — VITAMIN B-1 100 MG PO TABS
100.0000 mg | ORAL_TABLET | Freq: Every day | ORAL | Status: DC
  Administered 2012-04-18: 100 mg via ORAL
  Filled 2012-04-18: qty 1

## 2012-04-18 MED ORDER — LORAZEPAM 2 MG/ML IJ SOLN: 1.0000 mg | Freq: Four times a day (QID) | INTRAMUSCULAR | Status: DC | PRN

## 2012-04-18 MED ORDER — FOLIC ACID 1 MG PO TABS
1.0000 mg | ORAL_TABLET | Freq: Once | ORAL | Status: AC
  Administered 2012-04-18: 1 mg via ORAL
  Filled 2012-04-18: qty 1

## 2012-04-18 MED ORDER — LORAZEPAM 1 MG PO TABS
1.0000 mg | ORAL_TABLET | Freq: Four times a day (QID) | ORAL | Status: DC | PRN
  Administered 2012-04-18: 1 mg via ORAL
  Filled 2012-04-18: qty 1

## 2012-04-18 NOTE — ED Notes (Signed)
wanded by security 

## 2012-04-18 NOTE — BH Assessment (Signed)
Assessment Note   Kelsey [name] is a 41 y.o. African American female. Pt was brought to [facility] ED Thursday, 04/17/12, by her mother due to alcohol use and being, "drunk." Pt was alone in room during assessment and states that she had 2 bottles of wine sometime Thursday and usually drinks 3-4 bottles of wine. Pt reported that she had been sober the past 6 months but started drinking 4 days ago because she got her period, which pt reports makes her have strong cravings for wine. Pt denies withdrawal symptoms but then confirmed that she typically becomes nauseous, throws up and sometimes has the sweats after she's ceased drinking. Pt denies previous medical stabilizations due to her drinking before this visit. Pt reports that she started drinking at 41yo, denies any form of physical, emotional or sexual abuse or hx of trauma. Pt reports she can return home to her mother's house. A nurse later reported to ACT counselor that mother had left the ED earlier to take out a restraining order on pt due to pt's drinking. Pt reports she does not want residential or detox help; she states she will accept referrals for outpatient tx for her alcohol use. Pt confirms that alcohol is a problem: has lost her car, job, "everything." Pt reports she has a 1yo son who lives in [place], [place] with son's husband who has custody. Pt denies SI, HI and denies hallucinations. Pt denies feelings of depression and reports she was previously diagnosed with depression awhile ago. Pt denies use of other drugs. Pt reports SI, "a long time ago" due to taking birth control. Pt reports longest period of sobriety is 9 months when she was pregnant. Pt reports 6-8 hrs of sleep and that she eats 2 meals daily. Pt denies family hx of SA and denies family hx of mental illness. Pt reports [facility]yr ago at [facility] in [place] of alcohol tx, and within the last few years has also been to alcohol programs in [place] and [place]. Pt reports she does not want  inpatient help but will accept referral information from ACT counselor. Counselor is waiting on doctor for pt's final disposition.   Axis I: 303.90 Alcohol Dependence Axis II: 799.9 Deferred Axis III:  Past Medical History  Diagnosis Date  . Alcohol abuse   . Depression   . Suicide attempt    Axis IV: occupational problems, other psychosocial or environmental problems, problems related to social environment and problems with primary support group Axis V: 40  Past Medical History:  Past Medical History  Diagnosis Date  . Alcohol abuse   . Depression   . Suicide attempt     Past Surgical History  Procedure Date  . Cosmetic surgery     Family History: No family history on file.  Social History:  does not have a smoking history on file. She does not have any smokeless tobacco history on file. She reports that she drinks about 14.4 ounces of alcohol per week. She reports that she does not use illicit drugs.  Additional Social History:  Alcohol / Drug Use History of alcohol / drug use?: Yes Substance #1 Name of Substance 1: Wine 1 - Age of First Use: 41yo 1 - Amount (size/oz): 3-4 bottles 1 - Frequency: Daily 1 - Duration: On and off since 41yo 1 - Last Use / Amount: 04/17/12; drank 2 bottles of pino grigio  CIWA: CIWA-Ar BP: 112/70 mmHg Pulse Rate: 87  Nausea and Vomiting: 2 Tactile Disturbances: none Tremor: no tremor Auditory  Disturbances: not present Paroxysmal Sweats: barely perceptible sweating, palms moist Visual Disturbances: not present Anxiety: no anxiety, at ease Headache, Fullness in Head: none present Agitation: normal activity Orientation and Clouding of Sensorium: oriented and can do serial additions CIWA-Ar Total: 3  COWS:    Allergies: No Known Allergies  Home Medications:  (Not in a hospital admission)  OB/GYN Status:  Patient's last menstrual period was 04/13/2012.  General Assessment Data Location of Assessment: [facility] ED Living Arrangements:  Parent (Pt lives with mother) Can pt return to current living arrangement?: Yes Admission Status: Voluntary Is patient capable of signing voluntary admission?: Yes Transfer from: Home Referral Source: Self/Family/Friend  Education Status Is patient currently in school?: No  Risk to self Suicidal Ideation: No Suicidal Intent: No Is patient at risk for suicide?: No Suicidal Plan?: No Access to Means: No What has been your use of drugs/alcohol within the last 12 months?: Pt drinks wine daily but has been sober for past 72mo Previous Attempts/Gestures: No How many times?: 0  Other Self Harm Risks: None Triggers for Past Attempts: None known Intentional Self Injurious Behavior: None Family Suicide History: No Recent stressful life event(s): Conflict (Comment) (Pt's mother wants her to stop drinking) Persecutory voices/beliefs?: No Depression: No Depression Symptoms:  (Pt denies) Substance abuse history and/or treatment for substance abuse?: Yes Suicide prevention information given to non-admitted patients: Yes (Pt was given referrals and emergency numbers)  Risk to Others Homicidal Ideation: No Thoughts of Harm to Others: No Current Homicidal Intent: No Current Homicidal Plan: No Access to Homicidal Means: No Identified Victim: N/A History of harm to others?: No Assessment of Violence: On admission Violent Behavior Description: N/A Does patient have access to weapons?: No Criminal Charges Pending?: No Does patient have a court date: No  Psychosis Hallucinations: None noted Delusions: None noted  Mental Status Report Appear/Hygiene: Other (Comment) (Pt appeared dressed and was under bed covers) Eye Contact: Poor Motor Activity: Unremarkable (Pt did not want lights turned, laying on her side in bed) Speech: Logical/coherent;Soft Level of Consciousness: Quiet/awake Mood: Apathetic Affect: Apathetic Anxiety Level: None Thought Processes: Coherent;Relevant Judgement:  Unimpaired Orientation: Person;Place;Time;Situation Obsessive Compulsive Thoughts/Behaviors: None  Cognitive Functioning Concentration: Normal Memory: Recent Intact;Remote Intact IQ: Average Insight: Fair Impulse Control: Fair Appetite: Good Weight Loss: 0  Weight Gain: 0  Sleep: No Change Total Hours of Sleep: 8  Vegetative Symptoms: None  ADLScreening [facility]) Patient's cognitive ability adequate to safely complete daily activities?: Yes Patient able to express need for assistance with ADLs?: Yes Independently performs ADLs?: Yes  Abuse/Neglect [facility]) Physical Abuse: Denies Verbal Abuse: Denies Sexual Abuse: Denies  Prior Inpatient Therapy Prior Inpatient Therapy: Yes Prior Therapy Dates: Rehab: 43yr ago at [facility] in [place], in the past few yrs: [place] and [place] Prior Therapy Facilty/Provider(s): See above Reason for Treatment: Alcohol use  Prior Outpatient Therapy Prior Outpatient Therapy: Yes Prior Therapy Dates: 54yr ago for 2 months Prior Therapy Facilty/Provider(s): A facility on [address] in [place], [place] Reason for Treatment: Alcohol use  ADL Screening (condition at time of admission) Patient's cognitive ability adequate to safely complete daily activities?: Yes Patient able to express need for assistance with ADLs?: Yes Independently performs ADLs?: Yes Weakness of Legs: None Weakness of Arms/Hands: None  Home Assistive Devices/Equipment Home Assistive Devices/Equipment: None    Abuse/Neglect Assessment (Assessment to be complete while patient is alone) Physical Abuse: Denies Verbal Abuse: Denies Sexual Abuse: Denies Values / Beliefs Cultural Requests During Hospitalization: None Spiritual Requests During Hospitalization: None  Nutrition Screen Diet: NPO Unintentional weight loss greater than 10lbs within the last month: No Problems chewing or swallowing foods and/or liquids: No Home Tube Feeding or Total Parenteral  Nutrition (TPN): No Patient appears severely malnourished: No Pregnant or Lactating: No  Additional Information 1:1 In Past 12 Months?: No CIRT Risk: No Elopement Risk: No Does patient have medical clearance?: Yes     Disposition: Pt does not meet inpatient criteria; this information will be passed on the the doctor so he/she can make the decision of pt's final disposition. Disposition Disposition of Patient: Other dispositions (Pt does not meet criteria for inpatient) Other disposition(s): Other (Comment) (Pt doesn't meet inpatient criteria; will communicate to doct)  On Site Evaluation by:   Reviewed with Physician:     Constance Haw, Irwin Brakeman 04/18/2012 5:26 PM

## 2012-04-18 NOTE — ED Notes (Signed)
Assumed care of pt.  Pt denies SI/HI, V/A hallucinations.  States that she wants detox.  pts mother in the room with her-reports that if her daughter does not get clean there is going to be a restraining order from her mothers house and then will have no place to stay.  pts mother left angry.  Took all of pts belongings with her.  Pt laying in bed, appears intoxicated.  Reports that she had a suicide attempt x 20 years ago.

## 2012-04-18 NOTE — ED Notes (Signed)
Per pt's  Mom   Since pt has been d/c from [facility] on the 04/16/12 she has been drunk numerous times and and has been sitting in her truck in urine and feces. moom states that pt has said many times  That she wamts to kill herself. Had a fight with her mother also during that time

## 2012-04-18 NOTE — ED Notes (Signed)
Changed to scrubs security notified about about wanding and sitter called and house coverage called and charged notified

## 2012-04-18 NOTE — ED Notes (Signed)
Pt attempted to get ua could not

## 2012-04-18 NOTE — ED Notes (Signed)
Pt presents to department for evaluation of detox from ETOH. Has been drinking heavily x1 week. States she drinks several bottles of wine at one time. Last drink was Thursday night. Denies SI/HI at the present. Mother at bedside, states her behavior has been erratic and she is not herself. Denies pain at the time. She is alert and oriented x4.

## 2012-04-18 NOTE — ED Provider Notes (Signed)
History  This chart was scribed for Hilario Quarry, MD by Bennett Scrape. This patient was seen in room STRE5/STRE5 and the patient's care was started at 12:24PM.  CSN: [id number]  Arrival date & time 04/18/12  1152   First MD Initiated Contact with Patient 04/18/12 1224      Chief Complaint  Patient presents with  . Alcohol Intoxication  . Medical Clearance    The history is provided by the patient. No language interpreter was used.    Kelsey [name] is a 41 y.o. female who presents to the Emergency Department requesting alcohol detox. Pt was seen at [facility] 2 days ago. Mother states that pt was not given any outpatient programs when discharge. Mother states that the pt came home with her and stated that she was going to an appointment. Mother states that they pt came back drunk. She states that she told the pt to leave and the pt broke a window, jumped in her truck and drove off. Mother states that the pt ran into a fire hydrant and was arrested by police. She stayed the night in jail and mother states that she put in a restraining order. Father picked pt up from police department and pt took of in her truck. Pt states that she has been staying in her truck for the past 3 days. Mother states that the people in the apartment complex where she had her truck parked called police. Father dropped her off at mother's house last night. Mother states that [facility] was set up through church and came to visit the pt yesterday. Mother states that the pt discussed SI yesterday with the [facility] counselor. Pt states that she doesn't want to hurt herself now and states that the SI was "just the alcohol talking". Pt reports that she drinks 2 bottles of wine a day. She reports that she has prior detox attempts. The last time was "a while ago". Pt also c/o a rash on her buttocks from where she had been sitting in her urine in her truck for the past 3 days. She denies the possibility of being pregnant and  states that her LNMP was 04/13/12. She states that her periods are regular. She has a h/o depression and suicide attempts.   Past Medical History  Diagnosis Date  . Alcohol abuse   . Depression   . Suicide attempt   Possible bipolar disorder per mother  Past Surgical History  Procedure Date  . Cosmetic surgery     No family history on file.  History  Substance Use Topics  . Smoking status: Not on file  . Smokeless tobacco: Not on file  . Alcohol Use: Yes     Review of Systems  Constitutional: Negative for fever and chills.  Skin: Positive for rash (on bilateral buttocks).  Psychiatric/Behavioral: Negative for suicidal ideas.    Allergies  Review of patient's allergies indicates no known allergies.  Home Medications   Current Outpatient Rx  Name Route Sig Dispense Refill  . PROMETHAZINE HCL 25 MG PO TABS Oral Take 25 mg by mouth every 6 (six) hours as needed. For nausea.      Triage Vitals: BP 107/76  Pulse 88  Temp 98.8 F (37.1 C) (Oral)  Resp 18  SpO2 96%  LMP 04/13/2012  Physical Exam  Nursing note and vitals reviewed. Constitutional: She is oriented to person, place, and time. She appears well-developed and well-nourished. No distress.  HENT:  Head: Normocephalic and  atraumatic.  Eyes: Conjunctivae and EOM are normal.  Neck: Neck supple. No tracheal deviation present.  Cardiovascular: Normal rate.   Pulmonary/Chest: Effort normal. No respiratory distress.  Musculoskeletal: Normal range of motion.  Neurological: She is alert and oriented to person, place, and time.  Skin: Skin is warm and dry.       Erythematous rash on bilateral buttocks with scratches consistent with fingernails    Psychiatric: She has a normal mood and affect. Her behavior is normal.    ED Course  Procedures (including critical care time)  DIAGNOSTIC STUDIES: Oxygen Saturation is 96% on room air, adequate by my interpretation.    COORDINATION OF CARE: 12:33PM-Discussed  treatment plan which includes IV fluids with pt and pt agreed to plan.    Labs Reviewed  CBC  COMPREHENSIVE METABOLIC PANEL  ETHANOL  URINE RAPID DRUG SCREEN (HOSP PERFORMED)  ACETAMINOPHEN LEVEL   No results found.   No diagnosis found.    Patient awake and alert and oriented. She has been taking po  by mouth without difficulty. Packed has seen and evaluated the patient at this time she does not wish to remain in a facility. Intact team member is placed a call to her mother to assess whether or not she has somewhere to go. Patient's care discussed with Dr. Lynelle Doctor and he is apprised of her situation. Active salt attempting to contact her mother. 7:30 PM       Hilario Quarry, MD 04/18/12 [phone]

## 2012-04-18 NOTE — B[facility] Counselor (Signed)
ACT Counselor spoke with pt's mother with pt's verbal permission. Pt's mother states pt cannot return back home unless pt accepts detox or inpatient residential tx. Pt previously stated that she was able to return home if she accepted outpatient tx. Detox, residential tx info was given to pt and doctor and nurse were informed of counselor's conversation with mother.

## 2012-04-19 NOTE — ED Notes (Signed)
Talked to the charge nurse pt will be discharged and wait for the social worker.

## 2012-04-19 NOTE — ED Notes (Signed)
Spoke to pt mom over the phone stated she will sent somebody to bring her cloths to her. She was informed her daughter  has being discharged and is waiting in the lobby.

## 2012-04-19 NOTE — ED Notes (Signed)
ACT evaluation completed. Patient does not meet criteria for inpatient alcohol detox. Patient aware. She was given referrals to shelters and outpatient detox.  Patient is clinically sober and stable for discharge home. No suicidal ideation or homicidal ideation. No psychosis. No indication for IVC or admission at this time.  Kelsey Nielsen, MD 04/19/12 [phone]

## 2012-04-19 NOTE — ED Notes (Signed)
Pt due for discharge,pt stated she is homeless, she has no place to go, stated her mom dose not want her home or anywhere around her house, spoke to charge nurse, pt will be discharged by am and social work consult in am also.

## 2012-04-19 NOTE — Discharge Instructions (Signed)
Alcohol Problems Most adults who drink alcohol drink in moderation (not a lot) are at low risk for developing problems related to their drinking. However, all drinkers, including low-risk drinkers, should know about the health risks connected with drinking alcohol. RECOMMENDATIONS FOR LOW-RISK DRINKING  Drink in moderation. Moderate drinking is defined as follows:   Men - no more than 2 drinks per day.   Nonpregnant women - no more than 1 drink per day.   Over age 40 - no more than 1 drink per day.  A standard drink is 12 grams of pure alcohol, which is equal to a 12 ounce bottle of beer or wine cooler, a 5 ounce glass of wine, or 1.5 ounces of distilled spirits (such as whiskey, brandy, vodka, or rum).  ABSTAIN FROM (DO NOT DRINK) ALCOHOL:  When pregnant or considering pregnancy.   When taking a medication that interacts with alcohol.   If you are alcohol dependent.   A medical condition that prohibits drinking alcohol (such as ulcer, liver disease, or heart disease).  DISCUSS WITH YOUR CAREGIVER:  If you are at risk for coronary heart disease, discuss the potential benefits and risks of alcohol use: Light to moderate drinking is associated with lower rates of coronary heart disease in certain populations (for example, men over age 87 and postmenopausal women). Infrequent or nondrinkers are advised not to begin light to moderate drinking to reduce the risk of coronary heart disease so as to avoid creating an alcohol-related problem. Similar protective effects can likely be gained through proper diet and exercise.   Women and the elderly have smaller amounts of body water than men. As a result women and the elderly achieve a higher blood alcohol concentration after drinking the same amount of alcohol.   Exposing a fetus to alcohol can cause a broad range of birth defects referred to as Fetal Alcohol Syndrome (FAS) or Alcohol-Related Birth Defects (ARBD). Although FAS/ARBD is connected with  excessive alcohol consumption during pregnancy, studies also have reported neurobehavioral problems in infants born to mothers reporting drinking an average of 1 drink per day during pregnancy.   Heavier drinking (the consumption of more than 4 drinks per occasion by men and more than 3 drinks per occasion by women) impairs learning (cognitive) and psychomotor functions and increases the risk of alcohol-related problems, including accidents and injuries.  CAGE QUESTIONS:   Have you ever felt that you should Cut down on your drinking?   Have people Annoyed you by criticizing your drinking?   Have you ever felt bad or Guilty about your drinking?   Have you ever had a drink first thing in the morning to steady your nerves or get rid of a hangover (Eye opener)?  If you answered positively to any of these questions: You may be at risk for alcohol-related problems if alcohol consumption is:   Men: Greater than 14 drinks per week or more than 4 drinks per occasion.   Women: Greater than 7 drinks per week or more than 3 drinks per occasion.  Do you or your family have a medical history of alcohol-related problems, such as:  Blackouts.   Sexual dysfunction.   Depression.   Trauma.   Liver dysfunction.   Sleep disorders.   Hypertension.   Chronic abdominal pain.   Has your drinking ever caused you problems, such as problems with your family, problems with your work (or school) performance, or accidents/injuries?   Do you have a compulsion to drink or a preoccupation  Do you have a compulsion to drink or a preoccupation with drinking?   Do you have poor control or are you unable to stop drinking once you have started?   Do you have to drink to avoid withdrawal symptoms?   Do you have problems with withdrawal such as tremors, nausea, sweats, or mood disturbances?   Does it take more alcohol than in the past to get you high?   Do you feel a strong urge to drink?   Do you change your plans so that you can have a drink?   Do you ever drink in the morning to relieve  the shakes or a hangover?  If you have answered a number of the previous questions positively, it may be time for you to talk to your caregivers, family, and friends and see if they think you have a problem. Alcoholism is a chemical dependency that keeps getting worse and will eventually destroy your health and relationships. Many alcoholics end up dead, impoverished, or in prison. This is often the end result of all chemical dependency.   Do not be discouraged if you are not ready to take action immediately.   Decisions to change behavior often involve up and down desires to change and feeling like you cannot decide.   Try to think more seriously about your drinking behavior.   Think of the reasons to quit.  WHERE TO GO FOR ADDITIONAL INFORMATION    The National Institute on Alcohol Abuse and Alcoholism (NIAAA)www.niaaa.nih.gov   National Council on Alcoholism and Drug Dependence (NCADD)www.ncadd.org   American Society of Addiction Medicine (ASAM)www.asam.org  Document Released: 10/22/2005 Document Revised: 10/11/2011 Document Reviewed: 06/09/2008  Exit[employer] Patient Information 2012 [employer].

## 2012-04-19 NOTE — ED Notes (Signed)
ED MSW NOTE:  Weekend MSW received call from [facility] stating Ms. Sanguinetti was discharged last night and is waiting to talk to this Clinical research associate. MSW reviewed chart notes from last evening and spoke to [employer] who was on site for additional information. MSW met with pt who stated she has reviewed the resources provided to her via the ACT team and she is planning to stay in a local hotel until Monday where she will seek IOP services at  [facility].  Pt was able to contract for safety stating she has money for the hotel, her cell phone to call neighbors/friends if needed. Pt denies the need to stay in a shelter stating she has options via family/friends. Pt did express concern for transportation on Monday as she is with desire to report to the IOP clinic for tx.  MSW provided pt x2 bus passes to get from her hotel to the IOP clinic as well as provided her information on how to obtain a monthly bus pass via [employer].  Pt confirmed she is with no car as hers was impounded via the police and her drivers license was revoked 2/2 the DUI.  Pt stated she will have her friend come pick up and take her to the hotel. MSW provided pt Mobile Crisis No. And educated pt on the benefits of the crisis line and encouraged her to call if needed. Pt expressed appreciation for the resources and is with no further MSW interventions.  [employer] and RN updated on above.   Dionne Milo MSW [facility] Emergency Dept. Weekend/Social Worker [phone]

## 2012-04-19 NOTE — ED Notes (Signed)
Social worker paged and waiting for call back.

## 2012-07-02 IMAGING — US US ABDOMEN COMPLETE
1 series · 14 of 25 positions shown · non-contrast
Comparison: None.

CLINICAL DATA: Abnormal liver function tests

COMPLETE ABDOMINAL ULTRASOUND

[Series 1: us abdomen complete · 0.24mm/px · 14 of 83 slices shown]
[im 1/83]
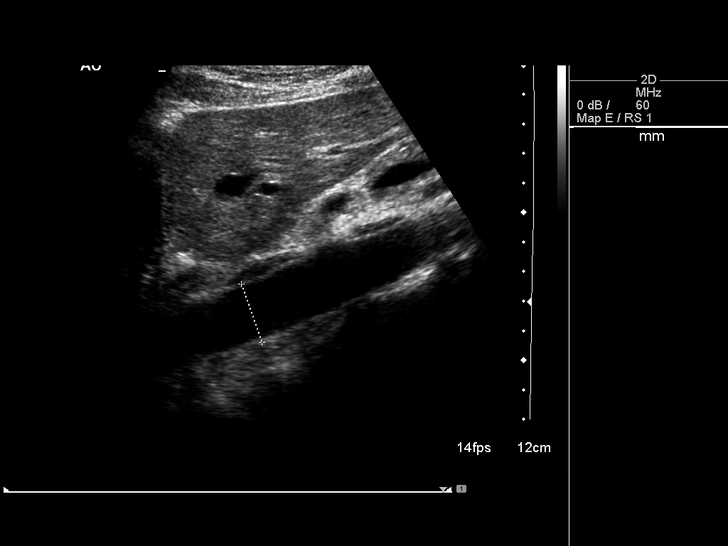
[im 7/83]
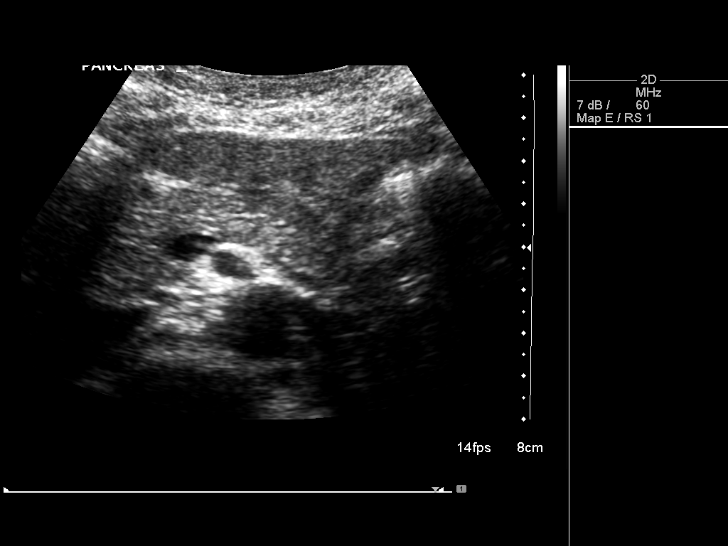
[im 14/83]
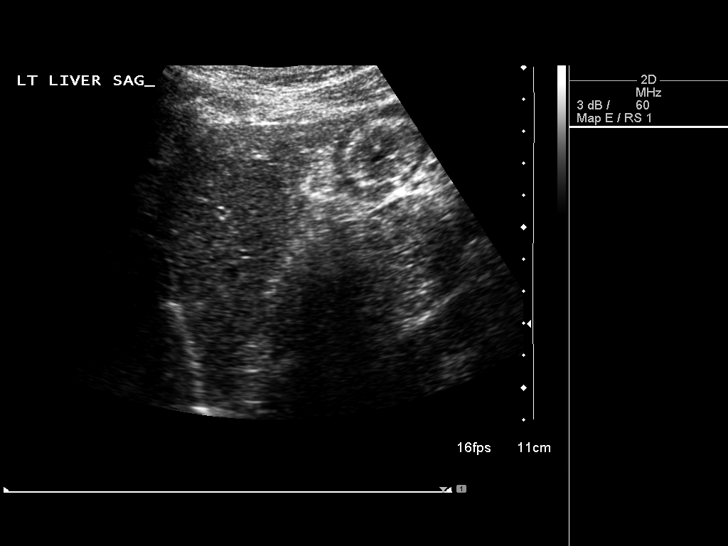
[im 21/83]
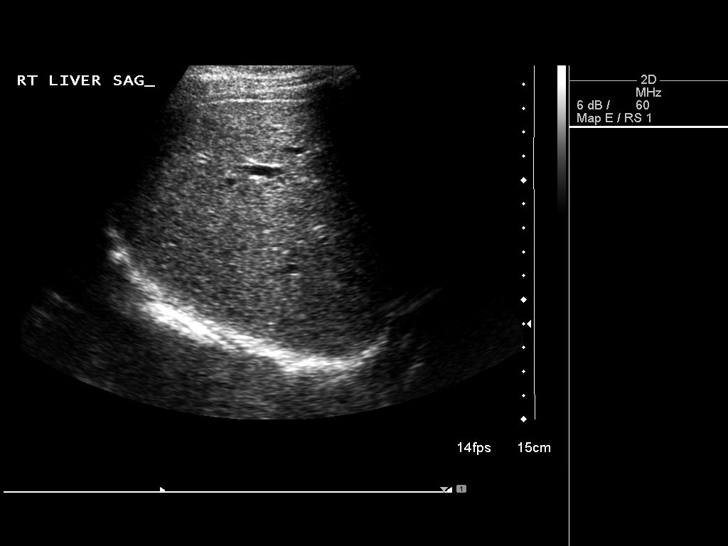
[im 28/83]
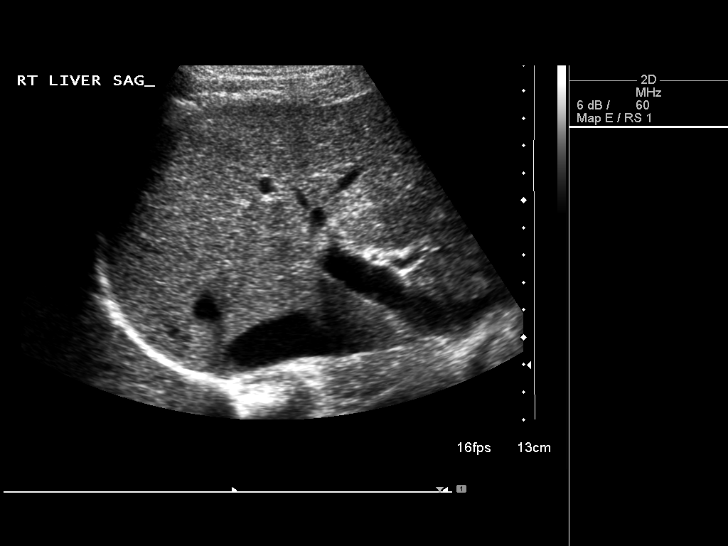
[im 31/83]
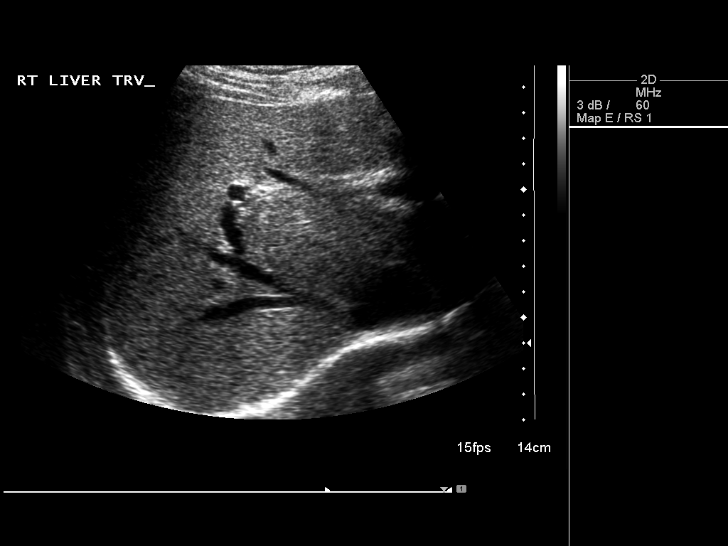
[im 38/83]
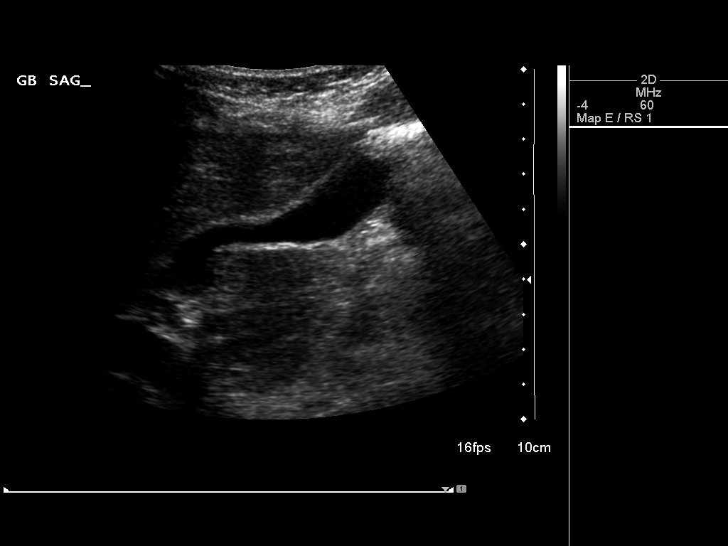
[im 45/83]
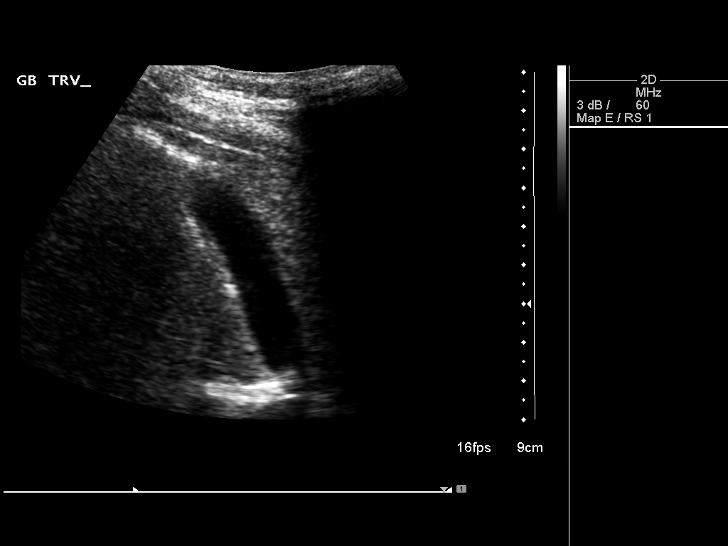
[im 52/83]
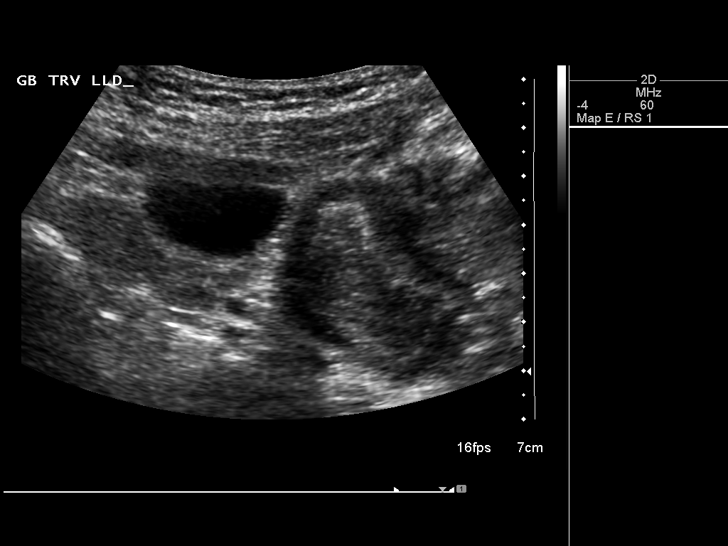
[im 55/83]
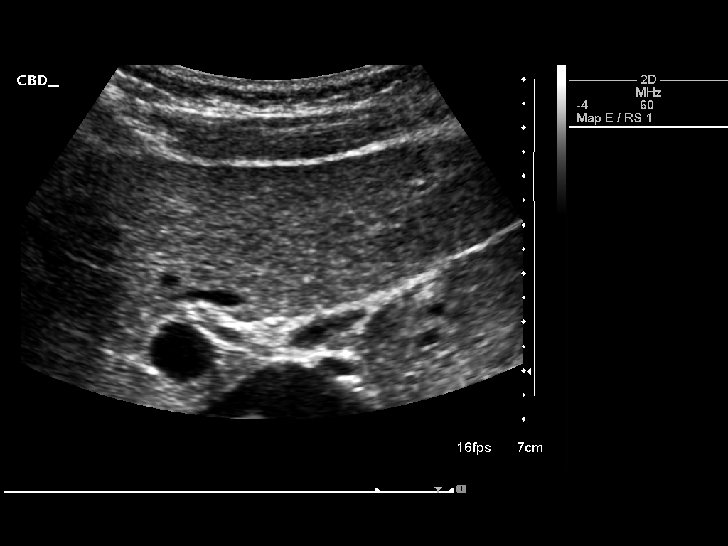
[im 62/83]
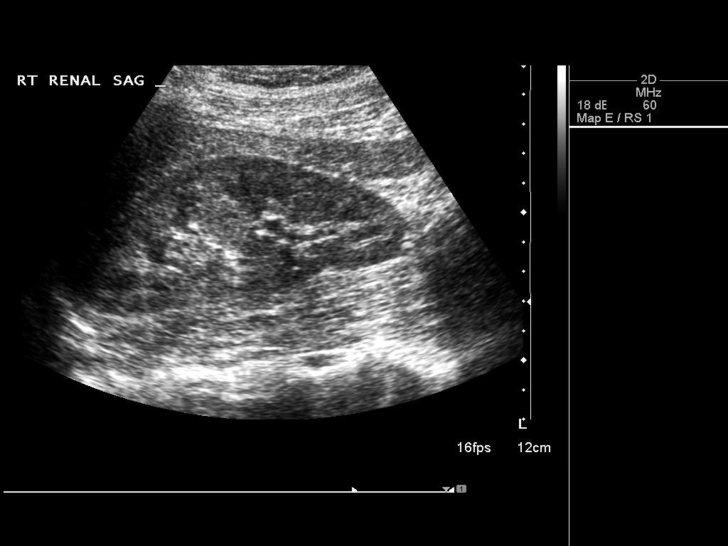
[im 69/83]
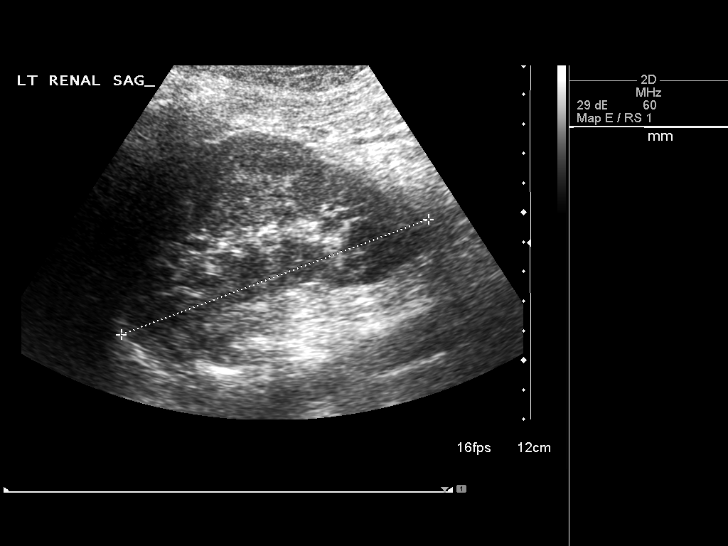
[im 76/83]
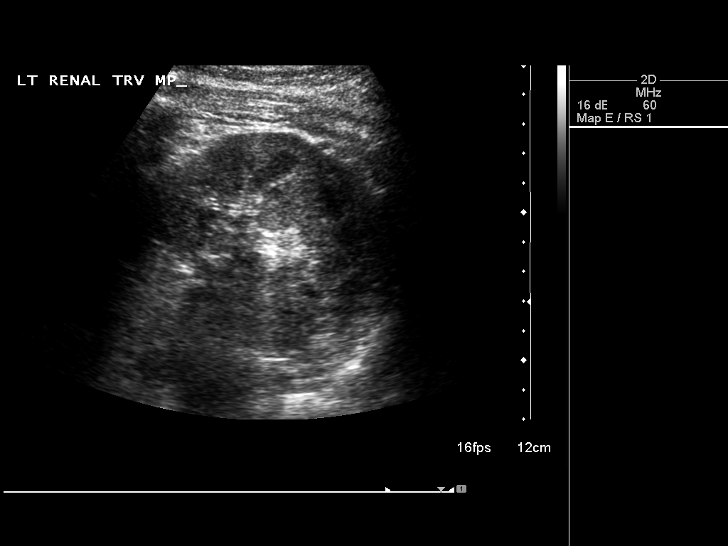
[im 83/83]
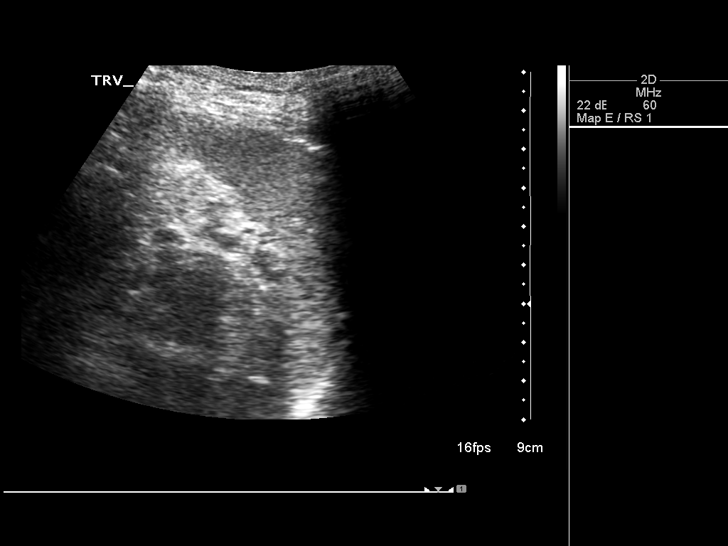

[14 of 25 positions shown; findings below may reference images not displayed]

FINDINGS: Gallbladder:  No gallstones, gallbladder wall thickening, or
pericholecystic fluid.

Common bile duct:  3 mm, normal.

Liver:  No focal lesion identified.  Within normal limits in
parenchymal echogenicity.

IVC:  Appears normal.

Pancreas:  No focal abnormality seen.

Spleen:  44 mm, normal echotexture.

Right Kidney:  11.5 cm. Normal echotexture.  Normal central sinus
echo complex.  No calculi or hydronephrosis.

Left Kidney:  11.8 cm. Normal echotexture.  Normal central sinus
echo complex.  No calculi or hydronephrosis.

Abdominal aorta:  No aneurysm identified.
IMPRESSION: Normal abdominal ultrasound.

## 2012-08-22 ENCOUNTER — Emergency Department (HOSPITAL_COMMUNITY)
Admission: EM | Admit: 2012-08-22 | Discharge: 2012-08-23 | Disposition: A | Discharge status: Home / Self Care | Payer: Medicaid Other | Attending: Emergency Medicine | Admitting: Emergency Medicine

## 2012-08-22 ENCOUNTER — Encounter (HOSPITAL_COMMUNITY): Payer: Self-pay | Admitting: *Deleted

## 2012-08-22 DIAGNOSIS — IMO0001 Reserved for inherently not codable concepts without codable children: Secondary | ICD-10-CM | POA: Insufficient documentation

## 2012-08-22 DIAGNOSIS — E876 Hypokalemia: Secondary | ICD-10-CM | POA: Insufficient documentation

## 2012-08-22 DIAGNOSIS — F1010 Alcohol abuse, uncomplicated: Secondary | ICD-10-CM | POA: Insufficient documentation

## 2012-08-22 DIAGNOSIS — M79609 Pain in unspecified limb: Secondary | ICD-10-CM | POA: Insufficient documentation

## 2012-08-22 LAB — URINALYSIS, ROUTINE W REFLEX MICROSCOPIC
Bilirubin Urine: NEGATIVE
Glucose, UA: NEGATIVE mg/dL
Hgb urine dipstick: NEGATIVE
Ketones, ur: NEGATIVE mg/dL
Leukocytes, UA: NEGATIVE
Nitrite: NEGATIVE
Protein, ur: 30 mg/dL — AB
Specific Gravity, Urine: 1.02 (ref 1.005–1.030)
Urobilinogen, UA: 1 mg/dL (ref 0.0–1.0)
pH: 8.5 — ABNORMAL HIGH (ref 5.0–8.0)

## 2012-08-22 LAB — URINE MICROSCOPIC-ADD ON

## 2012-08-22 LAB — CBC WITH DIFFERENTIAL/PLATELET
Basophils Absolute: 0 10*3/uL (ref 0.0–0.1)
Basophils Relative: 1 % (ref 0–1)
Eosinophils Absolute: 0 10*3/uL (ref 0.0–0.7)
Eosinophils Relative: 0 % (ref 0–5)
HCT: 33.8 % — ABNORMAL LOW (ref 36.0–46.0)
Hemoglobin: 11.6 g/dL — ABNORMAL LOW (ref 12.0–15.0)
Lymphocytes Relative: 13 % (ref 12–46)
Lymphs Abs: 0.8 10*3/uL (ref 0.7–4.0)
MCH: 33.4 pg (ref 26.0–34.0)
MCHC: 34.3 g/dL (ref 30.0–36.0)
MCV: 97.4 fL (ref 78.0–100.0)
Monocytes Absolute: 0.3 10*3/uL (ref 0.1–1.0)
Monocytes Relative: 5 % (ref 3–12)
Neutro Abs: 4.6 10*3/uL (ref 1.7–7.7)
Neutrophils Relative %: 81 % — ABNORMAL HIGH (ref 43–77)
Platelets: 257 10*3/uL (ref 150–400)
RBC: 3.47 MIL/uL — ABNORMAL LOW (ref 3.87–5.11)
RDW: 14 % (ref 11.5–15.5)
WBC: 5.7 10*3/uL (ref 4.0–10.5)

## 2012-08-22 LAB — RAPID URINE DRUG SCREEN, HOSP PERFORMED
Amphetamines: NOT DETECTED
Barbiturates: NOT DETECTED
Benzodiazepines: NOT DETECTED
Cocaine: NOT DETECTED
Opiates: NOT DETECTED
Tetrahydrocannabinol: NOT DETECTED

## 2012-08-22 LAB — COMPREHENSIVE METABOLIC PANEL
ALT: 12 U/L (ref 0–35)
AST: 24 U/L (ref 0–37)
Albumin: 4.3 g/dL (ref 3.5–5.2)
Alkaline Phosphatase: 42 U/L (ref 39–117)
BUN: 13 mg/dL (ref 6–23)
CO2: 30 mEq/L (ref 19–32)
Calcium: 8.7 mg/dL (ref 8.4–10.5)
Chloride: 98 mEq/L (ref 96–112)
Creatinine, Ser: 0.73 mg/dL (ref 0.50–1.10)
GFR calc Af Amer: >90 mL/min (ref >90)
GFR calc non Af Amer: >90 mL/min (ref >90)
Glucose, Bld: 107 mg/dL — ABNORMAL HIGH (ref 70–99)
Potassium: 3 mEq/L — ABNORMAL LOW (ref 3.5–5.1)
Sodium: 141 mEq/L (ref 135–145)
Total Bilirubin: 0.7 mg/dL (ref 0.3–1.2)
Total Protein: 7.6 g/dL (ref 6.0–8.3)

## 2012-08-22 LAB — ETHANOL: Alcohol, Ethyl (B): 161 mg/dL — ABNORMAL HIGH (ref 0–11)

## 2012-08-22 LAB — APTT: aPTT: 25 seconds (ref 24–37)

## 2012-08-22 LAB — LIPASE, BLOOD: Lipase: 21 U/L (ref 11–59)

## 2012-08-22 LAB — PROTIME-INR
INR: 0.97 (ref 0.00–1.49)
Prothrombin Time: 12.8 seconds (ref 11.6–15.2)

## 2012-08-22 MED ORDER — POTASSIUM CHLORIDE 10 MEQ/100ML IV SOLN
10.0000 meq | Freq: Once | INTRAVENOUS | Status: AC
  Administered 2012-08-22: 10 meq via INTRAVENOUS
  Filled 2012-08-22: qty 100

## 2012-08-22 MED ORDER — POTASSIUM CHLORIDE CRYS ER 20 MEQ PO TBCR
40.0000 meq | EXTENDED_RELEASE_TABLET | Freq: Once | ORAL | Status: AC
  Administered 2012-08-22: 40 meq via ORAL
  Filled 2012-08-22: qty 2

## 2012-08-22 MED ORDER — THIAMINE HCL 100 MG/ML IJ SOLN
Freq: Once | INTRAVENOUS | Status: AC
  Administered 2012-08-22: 22:00:00 via INTRAVENOUS
  Filled 2012-08-22: qty 1000

## 2012-08-22 MED ORDER — ONDANSETRON HCL 4 MG/2ML IJ SOLN
4.0000 mg | Freq: Once | INTRAMUSCULAR | Status: AC
  Administered 2012-08-22: 4 mg via INTRAVENOUS
  Filled 2012-08-22: qty 2

## 2012-08-22 NOTE — ED Notes (Signed)
Gave pt. Ginger Ale per MD orders for fluid challenge. Pt tolerated well

## 2012-08-22 NOTE — ED Provider Notes (Signed)
History     CSN: [id number]  Arrival date & time 08/22/12  1955   First MD Initiated Contact with Patient 08/22/12 2009      Chief Complaint  Patient presents with  . Hand Pain   HPI  History provided by patient and family. Patient is a 41 year old female with history of depression, alcohol abuse and previous suicide attempts who presents with complaints of hand and calf cramps. Patient states she was at work earlier today when she suddenly had flexion and cramping of bilateral hands and wrists as well as her calfs. Symptoms seem to last a few hours but have improved since that time. Patient does admit to alcohol use this morning. Patient drinks wine and admits to drinking several bottles a day. Family is also concerned that patient has stopped taking her procedure and has had increased alcohol drinking. Patient was recently in a detox program reports being sober for approximately one month. She denies any social circumstances or other cause for her relapse. She denies any other drug use. Currently she denies any other complaints. She does wish to have some help with detox but at this time does not appear interested in inpatient treatment.   Past Medical History  Diagnosis Date  . Alcohol abuse   . Depression   . Suicide attempt     Past Surgical History  Procedure Date  . Cosmetic surgery     History reviewed. No pertinent family history.  History  Substance Use Topics  . Smoking status: Not on file  . Smokeless tobacco: Not on file  . Alcohol Use: 18.0 oz/week    30 Glasses of wine per week    OB History    Grav Para Term Preterm Abortions TAB SAB Ect Mult Living                  Review of Systems  Constitutional: Positive for appetite change. Negative for fever and chills.  HENT: Negative for congestion and rhinorrhea.   Respiratory: Negative for cough.   Cardiovascular: Negative for chest pain.  Musculoskeletal: Positive for myalgias.  Neurological: Negative  for tremors, weakness, light-headedness, numbness and headaches.    Allergies  Review of patient's allergies indicates no known allergies.  Home Medications   Current Outpatient Rx  Name Route Sig Dispense Refill  . DESVENLAFAXINE SUCCINATE ER 50 MG PO TB24 Oral Take 50 mg by mouth daily.    . ADULT MULTIVITAMIN W/MINERALS CH Oral Take 1 tablet by mouth daily.      BP 115/76  Pulse 84  Temp 98.4 F (36.9 C) (Oral)  Resp 18  SpO2 97%  Physical Exam  Nursing note and vitals reviewed. Constitutional: She is oriented to person, place, and time. She appears well-developed and well-nourished. No distress.  HENT:  Head: Normocephalic and atraumatic.  Eyes: Conjunctivae normal and EOM are normal. Pupils are equal, round, and reactive to light.  Neck: Normal range of motion. Neck supple.       No meningeal signs  Cardiovascular: Normal rate and regular rhythm.   No murmur heard. Pulmonary/Chest: Effort normal and breath sounds normal. No respiratory distress. She has no wheezes. She has no rales.  Abdominal: Soft. There is no tenderness. There is no rigidity, no rebound, no guarding, no CVA tenderness and no tenderness at McBurney's point.  Musculoskeletal: Normal range of motion. She exhibits no edema and no tenderness.  Neurological: She is alert and oriented to person, place, and time. She has normal strength. No  cranial nerve deficit or sensory deficit. Gait normal.  Skin: Skin is warm and dry. No rash noted.  Psychiatric: She has a normal mood and affect. Her behavior is normal.    ED Course  Procedures   Results for orders placed during the hospital encounter of 08/22/12  CBC WITH DIFFERENTIAL      Component Value Range   WBC 5.7  4.0 - 10.5 K/uL   RBC 3.47 (*) 3.87 - 5.11 MIL/uL   Hemoglobin 11.6 (*) 12.0 - 15.0 g/dL   HCT 45.4 (*) 09.8 - 11.9 %   MCV 97.4  78.0 - 100.0 fL   MCH 33.4  26.0 - 34.0 pg   MCHC 34.3  30.0 - 36.0 g/dL   RDW 14.7  82.9 - 56.2 %   Platelets  257  150 - 400 K/uL   Neutrophils Relative 81 (*) 43 - 77 %   Neutro Abs 4.6  1.7 - 7.7 K/uL   Lymphocytes Relative 13  12 - 46 %   Lymphs Abs 0.8  0.7 - 4.0 K/uL   Monocytes Relative 5  3 - 12 %   Monocytes Absolute 0.3  0.1 - 1.0 K/uL   Eosinophils Relative 0  0 - 5 %   Eosinophils Absolute 0.0  0.0 - 0.7 K/uL   Basophils Relative 1  0 - 1 %   Basophils Absolute 0.0  0.0 - 0.1 K/uL  COMPREHENSIVE METABOLIC PANEL      Component Value Range   Sodium 141  135 - 145 mEq/L   Potassium 3.0 (*) 3.5 - 5.1 mEq/L   Chloride 98  96 - 112 mEq/L   CO2 30  19 - 32 mEq/L   Glucose, Bld 107 (*) 70 - 99 mg/dL   BUN 13  6 - 23 mg/dL   Creatinine, Ser 1.30  0.50 - 1.10 mg/dL   Calcium 8.7  8.4 - 86.5 mg/dL   Total Protein 7.6  6.0 - 8.3 g/dL   Albumin 4.3  3.5 - 5.2 g/dL   AST 24  0 - 37 U/L   ALT 12  0 - 35 U/L   Alkaline Phosphatase 42  39 - 117 U/L   Total Bilirubin 0.7  0.3 - 1.2 mg/dL   GFR calc non Af Amer >90  >90 mL/min   GFR calc Af Amer >90  >90 mL/min  LIPASE, BLOOD      Component Value Range   Lipase 21  11 - 59 U/L  URINALYSIS, ROUTINE W REFLEX MICROSCOPIC      Component Value Range   Color, Urine YELLOW  YELLOW   APPearance CLEAR  CLEAR   Specific Gravity, Urine 1.020  1.005 - 1.030   pH 8.5 (*) 5.0 - 8.0   Glucose, UA NEGATIVE  NEGATIVE mg/dL   Hgb urine dipstick NEGATIVE  NEGATIVE   Bilirubin Urine NEGATIVE  NEGATIVE   Ketones, ur NEGATIVE  NEGATIVE mg/dL   Protein, ur 30 (*) NEGATIVE mg/dL   Urobilinogen, UA 1.0  0.0 - 1.0 mg/dL   Nitrite NEGATIVE  NEGATIVE   Leukocytes, UA NEGATIVE  NEGATIVE  URINE RAPID DRUG SCREEN (HOSP PERFORMED)      Component Value Range   Opiates NONE DETECTED  NONE DETECTED   Cocaine NONE DETECTED  NONE DETECTED   Benzodiazepines NONE DETECTED  NONE DETECTED   Amphetamines NONE DETECTED  NONE DETECTED   Tetrahydrocannabinol NONE DETECTED  NONE DETECTED   Barbiturates NONE DETECTED  NONE DETECTED  ETHANOL  Component Value Range    Alcohol, Ethyl (B) 161 (*) 0 - 11 mg/dL  PROTIME-INR      Component Value Range   Prothrombin Time 12.8  11.6 - 15.2 seconds   INR 0.97  0.00 - 1.49  APTT      Component Value Range   aPTT 25  24 - 37 seconds  URINE MICROSCOPIC-ADD ON      Component Value Range   Squamous Epithelial / LPF FEW (*) RARE   WBC, UA 0-2  <3 WBC/hpf   RBC / HPF 0-2  <3 RBC/hpf   Bacteria, UA RARE  RARE   Urine-Other MUCOUS PRESENT         1. Alcohol abuse   2. Hypokalemia       MDM  8:50PM patient seen and evaluated. Resting comfortably in bed. Currently without complaints of cramps in hands or legs.   Mother also reports that patient seems to have recurrent bleeding from left index finger around the nail. She states this especially happens when patient has been drinking heavily. On exam there is a very small area of increased vasculature. There is no ulceration. No current bleeding.   No concerning findings on labs. Patient offered options for alcohol detox and treatment. She does not wish to stay inpatient and wishes to return home. She has been given a referral information for outpatient resources.     Angus Seller, [place] 08/23/12 [phone]

## 2012-08-22 NOTE — ED Notes (Signed)
Bed:WA08<BR> Expected date:<BR> Expected time:<BR> Means of arrival:<BR> Comments:<BR> EMS

## 2012-08-22 NOTE — ED Notes (Signed)
Pt comes in from work c/o hands cramping; states that she was in front of her computer all day and has not eaten anything.

## 2012-08-23 NOTE — ED Notes (Signed)
Pt verbalizes understanding 

## 2012-08-24 NOTE — ED Provider Notes (Signed)
Medical screening examination/treatment/procedure(s) were performed by non-physician practitioner and as supervising physician I was immediately available for consultation/collaboration.   David Yelverton, MD 08/24/12 [id number] 

## 2014-01-26 ENCOUNTER — Emergency Department (HOSPITAL_COMMUNITY): Payer: Medicaid Other

## 2014-01-26 ENCOUNTER — Emergency Department (HOSPITAL_COMMUNITY)
Admission: EM | Admit: 2014-01-26 | Discharge: 2014-01-26 | Disposition: A | Discharge status: Home / Self Care | Payer: Medicaid Other | Attending: Emergency Medicine | Admitting: Emergency Medicine

## 2014-01-26 ENCOUNTER — Encounter (HOSPITAL_COMMUNITY): Payer: Self-pay | Admitting: Emergency Medicine

## 2014-01-26 DIAGNOSIS — M545 Low back pain, unspecified: Secondary | ICD-10-CM | POA: Insufficient documentation

## 2014-01-26 DIAGNOSIS — Z79899 Other long term (current) drug therapy: Secondary | ICD-10-CM | POA: Insufficient documentation

## 2014-01-26 DIAGNOSIS — F3289 Other specified depressive episodes: Secondary | ICD-10-CM | POA: Insufficient documentation

## 2014-01-26 DIAGNOSIS — O99340 Other mental disorders complicating pregnancy, unspecified trimester: Secondary | ICD-10-CM | POA: Insufficient documentation

## 2014-01-26 DIAGNOSIS — O034 Incomplete spontaneous abortion without complication: Secondary | ICD-10-CM | POA: Insufficient documentation

## 2014-01-26 DIAGNOSIS — F329 Major depressive disorder, single episode, unspecified: Secondary | ICD-10-CM | POA: Insufficient documentation

## 2014-01-26 LAB — I-STAT CHEM 8, ED
BUN: 10 mg/dL (ref 6–23)
Calcium, Ion: 1.21 mmol/L (ref 1.12–1.23)
Chloride: 104 mEq/L (ref 96–112)
Creatinine, Ser: 1.4 mg/dL — ABNORMAL HIGH (ref 0.50–1.10)
Glucose, Bld: 86 mg/dL (ref 70–99)
HCT: 38 % (ref 36.0–46.0)
Hemoglobin: 12.9 g/dL (ref 12.0–15.0)
Potassium: 3.8 mEq/L (ref 3.7–5.3)
Sodium: 144 mEq/L (ref 137–147)
TCO2: 25 mmol/L (ref 0–100)

## 2014-01-26 LAB — URINE MICROSCOPIC-ADD ON

## 2014-01-26 LAB — URINALYSIS, ROUTINE W REFLEX MICROSCOPIC
Bilirubin Urine: NEGATIVE
Glucose, UA: NEGATIVE mg/dL
Ketones, ur: NEGATIVE mg/dL
Leukocytes, UA: NEGATIVE
Nitrite: NEGATIVE
Protein, ur: NEGATIVE mg/dL
Specific Gravity, Urine: 1.003 — ABNORMAL LOW (ref 1.005–1.030)
Urobilinogen, UA: 0.2 mg/dL (ref 0.0–1.0)
pH: 7 (ref 5.0–8.0)

## 2014-01-26 LAB — WET PREP, GENITAL
Trich, Wet Prep: NONE SEEN
Yeast Wet Prep HPF POC: NONE SEEN

## 2014-01-26 LAB — POC URINE PREG, ED: Preg Test, Ur: NEGATIVE

## 2014-01-26 LAB — CBC WITH DIFFERENTIAL/PLATELET
Basophils Absolute: 0 10*3/uL (ref 0.0–0.1)
Basophils Relative: 0 % (ref 0–1)
Eosinophils Absolute: 0 10*3/uL (ref 0.0–0.7)
Eosinophils Relative: 1 % (ref 0–5)
HCT: 33.8 % — ABNORMAL LOW (ref 36.0–46.0)
Hemoglobin: 11.9 g/dL — ABNORMAL LOW (ref 12.0–15.0)
Lymphocytes Relative: 36 % (ref 12–46)
Lymphs Abs: 1.8 10*3/uL (ref 0.7–4.0)
MCH: 33.2 pg (ref 26.0–34.0)
MCHC: 35.2 g/dL (ref 30.0–36.0)
MCV: 94.4 fL (ref 78.0–100.0)
Monocytes Absolute: 0.3 10*3/uL (ref 0.1–1.0)
Monocytes Relative: 6 % (ref 3–12)
Neutro Abs: 2.8 10*3/uL (ref 1.7–7.7)
Neutrophils Relative %: 57 % (ref 43–77)
Platelets: 178 10*3/uL (ref 150–400)
RBC: 3.58 MIL/uL — ABNORMAL LOW (ref 3.87–5.11)
RDW: 14.6 % (ref 11.5–15.5)
WBC: 4.9 10*3/uL (ref 4.0–10.5)

## 2014-01-26 MED ORDER — HYDROCODONE-ACETAMINOPHEN 5-325 MG PO TABS: 1.0000 | ORAL_TABLET | ORAL | Status: DC | PRN

## 2014-01-26 MED ORDER — DOXYCYCLINE HYCLATE 100 MG PO CAPS: 100.0000 mg | ORAL_CAPSULE | Freq: Two times a day (BID) | ORAL | Status: DC

## 2014-01-26 MED ORDER — IBUPROFEN 800 MG PO TABS: 800.0000 mg | ORAL_TABLET | Freq: Three times a day (TID) | ORAL | Status: DC

## 2014-01-26 MED ORDER — MISOPROSTOL 200 MCG PO TABS
1000.0000 ug | ORAL_TABLET | Freq: Once | ORAL | Status: AC
  Administered 2014-01-26: 1000 ug via VAGINAL
  Filled 2014-01-26: qty 5

## 2014-01-26 MED ORDER — SODIUM CHLORIDE 0.9 % IV BOLUS (SEPSIS)
1000.0000 mL | Freq: Once | INTRAVENOUS | Status: AC
  Administered 2014-01-26: 1000 mL via INTRAVENOUS

## 2014-01-26 NOTE — ED Notes (Signed)
Pt with slurred speech; states she has been drinking wine to help with pain; ambulating without difficulty

## 2014-01-26 NOTE — Progress Notes (Signed)
   CARE MANAGEMENT ED NOTE 01/26/2014  Patient:  Raeanne BarryCOURTNEY,Taneeka K   Account Number:  [id number]  Date Initiated:  01/26/2014  Documentation initiated by:  Radford PaxFERRERO,AMY  Subjective/Objective Assessment:   Patient presents to Ed with pain in lower back     Subjective/Objective Assessment Detail:     Action/Plan:   Action/Plan Detail:   Anticipated DC Date:  01/26/2014     Status Recommendation to Physician:   Result of Recommendation:    Other ED Services  Consult Working Plan    DC Planning Services  Other  PCP issues    Choice offered to / List presented to:            Status of service:    ED Comments:   ED Comments Detail:  Patient confirms her pcp [facility] [address]  System updated.

## 2014-01-26 NOTE — ED Notes (Signed)
Assumed care of patient Introduced self to patient and reviewed POC--patient agreed and v/u Patient denies complaints or needs at this time Side rails up, call bell in reach

## 2014-01-26 NOTE — ED Provider Notes (Signed)
CSN: [id number]     Arrival date & time 01/26/14  1456 History   First MD Initiated Contact with Patient 01/26/14 1615     Chief Complaint  Patient presents with  . Back Pain     (Consider location/radiation/quality/duration/timing/severity/associated sxs/prior Treatment) HPI Kelsey [name] is a 43 y.o. female ends to emergency department with complaint of lower back pain. Patient states that her pain began approximately 2 months ago. There has not been any injuries. She denies pain radiating to her legs. She denies any numbness or weakness in extremities. No loss of bowels or urinary incontinence or retention. No fever chills. She states her pain is very well localized to the sacrum and coccyx area. Pain is worsened with movement, brushing teeth, walking. After we asked her mother to step out of the patient admits to having a recent abortion at a clinic in [place]. States this was done 2 weeks ago. States since then pain has worsened and she is concerned about a possible pelvic infections. She denies any vaginal discharge but states she's still bleeding. She also admits to lower suprapubic abdominal pain. No urinary symptoms. No fever chills. No nausea or vomiting. Patient admits to drinking alcohol today in order to mask the pain.  Past Medical History  Diagnosis Date  . Alcohol abuse   . Depression   . Suicide attempt    Past Surgical History  Procedure Laterality Date  . Cosmetic surgery     No family history on file. History  Substance Use Topics  . Smoking status: Never Smoker   . Smokeless tobacco: Not on file  . Alcohol Use: 18.0 oz/week    30 Glasses of wine per week   OB History   Grav Para Term Preterm Abortions TAB SAB Ect Mult Living                 Review of Systems  Constitutional: Negative for fever and chills.  Respiratory: Negative for cough, chest tightness and shortness of breath.   Cardiovascular: Negative for chest pain, palpitations and leg  swelling.  Gastrointestinal: Positive for abdominal pain. Negative for nausea, vomiting and diarrhea.  Genitourinary: Positive for vaginal bleeding and pelvic pain. Negative for dysuria, flank pain, vaginal discharge and vaginal pain.  Musculoskeletal: Positive for back pain. Negative for arthralgias, myalgias, neck pain and neck stiffness.  Skin: Negative for rash.  Neurological: Negative for dizziness, weakness and headaches.  All other systems reviewed and are negative.      Allergies  Review of patient's allergies indicates no known allergies.  Home Medications   Current Outpatient Rx  Name  Route  Sig  Dispense  Refill  . buPROPion (WELLBUTRIN XL) 150 MG 24 hr tablet   Oral   Take 150 mg by mouth daily.         . Cholecalciferol (VITAMIN D PO)   Oral   Take 1 tablet by mouth daily.         Marland Kitchen HYDROCODONE-ACETAMINOPHEN PO   Oral   Take by mouth once.         Marland Kitchen ibuprofen (ADVIL,MOTRIN) 800 MG tablet   Oral   Take 800 mg by mouth every 6 (six) hours as needed for moderate pain.         Marland Kitchen OVER THE COUNTER MEDICATION   Oral   Take 1 packet by mouth daily. "Emergen-C" vitamin supplement.          BP 129/89  Pulse 124  Temp(Src) 98.7 F (37.1  C) (Oral)  Resp 16  SpO2 98%  LMP 10/28/2013 Physical Exam  Nursing note and vitals reviewed. Constitutional: She appears well-developed and well-nourished. No distress.  HENT:  Head: Normocephalic.  Eyes: Conjunctivae are normal.  Neck: Neck supple.  Cardiovascular: Normal rate, regular rhythm and normal heart sounds.   Pulmonary/Chest: Effort normal and breath sounds normal. No respiratory distress. She has no wheezes. She has no rales.  Abdominal: Soft. Bowel sounds are normal. She exhibits no distension. There is tenderness. There is no rebound.  suprapubic  Musculoskeletal: She exhibits no edema.  Neurological: She is alert.  Skin: Skin is warm and dry.  Psychiatric: She has a normal mood and affect. Her  behavior is normal.    ED Course  Procedures (including critical care time) Labs Review Labs Reviewed  WET PREP, GENITAL - Abnormal; Notable for the following:    Clue Cells Wet Prep HPF POC RARE (*)    WBC, Wet Prep HPF POC FEW (*)    All other components within normal limits  CBC WITH DIFFERENTIAL - Abnormal; Notable for the following:    RBC 3.58 (*)    Hemoglobin 11.9 (*)    HCT 33.8 (*)    All other components within normal limits  URINALYSIS, ROUTINE W REFLEX MICROSCOPIC - Abnormal; Notable for the following:    Specific Gravity, Urine 1.003 (*)    Hgb urine dipstick TRACE (*)    All other components within normal limits  I-STAT CHEM 8, ED - Abnormal; Notable for the following:    Creatinine, Ser 1.40 (*)    All other components within normal limits  URINE CULTURE  GC/CHLAMYDIA PROBE AMP  URINE MICROSCOPIC-ADD ON  POC URINE PREG, ED   Imaging Review [place] Transvaginal Non-ob  01/26/2014   CLINICAL DATA:  Back pain.  Abortion 2 weeks ago.  Vaginal spotting.  EXAM: TRANSABDOMINAL AND TRANSVAGINAL ULTRASOUND OF PELVIS  TECHNIQUE: Both transabdominal and transvaginal ultrasound examinations of the pelvis were performed. Transabdominal technique was performed for global imaging of the pelvis including uterus, ovaries, adnexal regions, and pelvic cul-de-sac. It was necessary to proceed with endovaginal exam following the transabdominal exam to visualize the .  COMPARISON:  None  FINDINGS: Uterus  Measurements: 9.1 cm x 7.1 cm x 6.2 cm. Retroflexed uterus. No fibroids or other mass visualized.  Endometrium  Thickness: 9.5 mm. Heterogeneous relatively hypoechoic material centrally without internal vascular flow.  Right ovary  Measurements: 55 mm x 31 mm x 41 mm. 39 mm simple cyst is present. Peripheral displacement of ovarian parenchyma.  Left ovary  Measurements: 27 mm x 12 mm x 16 mm. Normal appearance/no adnexal mass.  IMPRESSION: 1. Residual material within the endometrium measuring 9.5  mm in thickness. No internal vascular flow. Endometrial thickness alone is not a reliable predictor of retained products of conception. 2. Simple right ovarian cyst.   Electronically Signed   By: Andreas NewportGeoffrey  Lamke M.D.   On: 01/26/2014 20:31   [place] Pelvis Complete  01/26/2014   CLINICAL DATA:  Back pain.  Abortion 2 weeks ago.  Vaginal spotting.  EXAM: TRANSABDOMINAL AND TRANSVAGINAL ULTRASOUND OF PELVIS  TECHNIQUE: Both transabdominal and transvaginal ultrasound examinations of the pelvis were performed. Transabdominal technique was performed for global imaging of the pelvis including uterus, ovaries, adnexal regions, and pelvic cul-de-sac. It was necessary to proceed with endovaginal exam following the transabdominal exam to visualize the .  COMPARISON:  None  FINDINGS: Uterus  Measurements: 9.1 cm x 7.1 cm x 6.2  cm. Retroflexed uterus. No fibroids or other mass visualized.  Endometrium  Thickness: 9.5 mm. Heterogeneous relatively hypoechoic material centrally without internal vascular flow.  Right ovary  Measurements: 55 mm x 31 mm x 41 mm. 39 mm simple cyst is present. Peripheral displacement of ovarian parenchyma.  Left ovary  Measurements: 27 mm x 12 mm x 16 mm. Normal appearance/no adnexal mass.  IMPRESSION: 1. Residual material within the endometrium measuring 9.5 mm in thickness. No internal vascular flow. Endometrial thickness alone is not a reliable predictor of retained products of conception. 2. Simple right ovarian cyst.   Electronically Signed   By: Andreas Newport M.D.   On: 01/26/2014 20:31     EKG Interpretation None      MDM   Final diagnoses:  Retained products of conception following abortion    8:56 PM US abdomen showing possible retained products of conception. On Exam, no CMT, some uterine tenderess. Pt is afebrile. No elevated WBC. Discussed with Dr. Shawnie Pons, advised cytotec , start on doxycycline, follow up at their clinic. Discussed with pt, agrees with plan. Pt is non  toxic appearing. Agrees with the plan. Home with doxy, norco for pain and cramping. Cytotec given in ED.    Filed Vitals:   01/26/14 1531 01/26/14 2049  BP: 129/89 125/88  Pulse: 124 93  Temp: 98.7 F (37.1 C) 98.9 F (37.2 C)  TempSrc: Oral Oral  Resp: 16 18  SpO2: 98% 96%       Lottie Mussel, PA-C 01/27/14 0053

## 2014-01-26 NOTE — ED Notes (Signed)
US tech at bedside

## 2014-01-26 NOTE — ED Notes (Signed)
Pt states that she had an abortion two weeks ago and ever since then, she has had pain in her lower back, increasingly worse.

## 2014-01-26 NOTE — ED Notes (Signed)
Awaiting medications from Pharmacy

## 2014-01-26 NOTE — ED Notes (Signed)
Pt c/o back pain since January; had an abortion two wks ago and states back pain worse than ever since then; does not want mom to know about abortion--mom is with patient;

## 2014-01-26 NOTE — Discharge Instructions (Signed)
Take ibuprofen for pain. norco for severe pain. Doxycycline for infection. [facility]. Expect cramping and vaginal bleeding. [facility] high fever, vomiting, increased pain.

## 2014-01-26 NOTE — ED Notes (Signed)
Patient calling mother for ride home.

## 2014-01-27 LAB — URINE CULTURE
Colony Count: NO GROWTH
Culture: NO GROWTH

## 2014-01-27 LAB — GC/CHLAMYDIA PROBE AMP
CT Probe RNA: NEGATIVE
GC Probe RNA: NEGATIVE

## 2014-01-27 NOTE — ED Provider Notes (Signed)
  Medical screening examination/treatment/procedure(s) were performed by non-physician practitioner and as supervising physician I was immediately available for consultation/collaboration.   EKG Interpretation None         Gerhard Munchobert Lockwood, MD 01/27/14 1550

## 2014-03-10 ENCOUNTER — Encounter (HOSPITAL_COMMUNITY): Payer: Self-pay | Admitting: Emergency Medicine

## 2014-03-10 ENCOUNTER — Emergency Department (HOSPITAL_COMMUNITY)
Admission: EM | Admit: 2014-03-10 | Discharge: 2014-03-10 | Disposition: A | Discharge status: Home / Self Care | Payer: Medicaid Other | Attending: Emergency Medicine | Admitting: Emergency Medicine

## 2014-03-10 DIAGNOSIS — F329 Major depressive disorder, single episode, unspecified: Secondary | ICD-10-CM | POA: Insufficient documentation

## 2014-03-10 DIAGNOSIS — R000 Tachycardia, unspecified: Secondary | ICD-10-CM | POA: Insufficient documentation

## 2014-03-10 DIAGNOSIS — F1010 Alcohol abuse, uncomplicated: Secondary | ICD-10-CM

## 2014-03-10 DIAGNOSIS — Z3202 Encounter for pregnancy test, result negative: Secondary | ICD-10-CM | POA: Insufficient documentation

## 2014-03-10 DIAGNOSIS — F3289 Other specified depressive episodes: Secondary | ICD-10-CM | POA: Insufficient documentation

## 2014-03-10 DIAGNOSIS — Z79899 Other long term (current) drug therapy: Secondary | ICD-10-CM | POA: Insufficient documentation

## 2014-03-10 LAB — CBC
HCT: 34.8 % — ABNORMAL LOW (ref 36.0–46.0)
Hemoglobin: 12.1 g/dL (ref 12.0–15.0)
MCH: 33.6 pg (ref 26.0–34.0)
MCHC: 34.8 g/dL (ref 30.0–36.0)
MCV: 96.7 fL (ref 78.0–100.0)
Platelets: 300 10*3/uL (ref 150–400)
RBC: 3.6 MIL/uL — ABNORMAL LOW (ref 3.87–5.11)
RDW: 14.1 % (ref 11.5–15.5)
WBC: 6.7 10*3/uL (ref 4.0–10.5)

## 2014-03-10 LAB — RAPID URINE DRUG SCREEN, HOSP PERFORMED
Amphetamines: NOT DETECTED
Barbiturates: NOT DETECTED
Benzodiazepines: NOT DETECTED
Cocaine: NOT DETECTED
Opiates: NOT DETECTED
Tetrahydrocannabinol: NOT DETECTED

## 2014-03-10 LAB — COMPREHENSIVE METABOLIC PANEL
ALT: 14 U/L (ref 0–35)
AST: 26 U/L (ref 0–37)
Albumin: 3.9 g/dL (ref 3.5–5.2)
Alkaline Phosphatase: 42 U/L (ref 39–117)
BUN: 13 mg/dL (ref 6–23)
CO2: 25 mEq/L (ref 19–32)
Calcium: 9 mg/dL (ref 8.4–10.5)
Chloride: 103 mEq/L (ref 96–112)
Creatinine, Ser: 0.77 mg/dL (ref 0.50–1.10)
GFR calc Af Amer: >90 mL/min (ref >90)
GFR calc non Af Amer: >90 mL/min (ref >90)
Glucose, Bld: 92 mg/dL (ref 70–99)
Potassium: 3.8 mEq/L (ref 3.7–5.3)
Sodium: 142 mEq/L (ref 137–147)
Total Bilirubin: 0.5 mg/dL (ref 0.3–1.2)
Total Protein: 7.1 g/dL (ref 6.0–8.3)

## 2014-03-10 LAB — POC URINE PREG, ED: Preg Test, Ur: NEGATIVE

## 2014-03-10 LAB — ETHANOL: Alcohol, Ethyl (B): 316 mg/dL — ABNORMAL HIGH (ref 0–11)

## 2014-03-10 LAB — ACETAMINOPHEN LEVEL: Acetaminophen (Tylenol), Serum: <15 ug/mL (ref 10–30)

## 2014-03-10 LAB — SALICYLATE LEVEL: Salicylate Lvl: <2 mg/dL — ABNORMAL LOW (ref 2.8–20.0)

## 2014-03-10 NOTE — ED Provider Notes (Signed)
CSN: [id number]     Arrival date & time 03/10/14  [id number] History   First MD Initiated Contact with Patient 03/10/14 [phone]     Chief Complaint  Patient presents with  . Medical Clearance     (Consider location/radiation/quality/duration/timing/severity/associated sxs/prior Treatment) HPI 43 year old female presents after her mother brought her in requesting detox. The patient states she normally drinks alcohol once a month while she is on her period. She states that she was recently drinking 3 hours ago this morning. She states she's not feel she needs a 28 day treatment program would be okay with doing an outpatient 2 or 3 day detox. She states that her mother is worried about her is when she comes off her alcohol she has nausea and gets shakes. She's never had seizures. She states that her primary doctor in [place] used to give her Phenergan for these episodes. There is report that the mom stated the patient was suicidal yesterday. Mom relates she has had a suicide attempt in the past and would not put it past the patient to try again. She says she's suicidal while drinking but mom does not hear her say this while sober. She relates the patient is frequently drunk, not just once per month, and frequently soils herself and takes overall poor care of herself. She states she can no longer take care of the patient by herself. Patient states her mom said she's going to kick her out of the house. She states because of this she threatened suicide "like I always do". She denies that she would actually kill herself but states she uses it as a tool to get back into the house as a way of manipulating her mom. Patient has a chronic history of low potassium and for the past month has felt pain in her muscles but no focal areas of pain. She states this happens mostly when she stops drinking. Denies any current symptoms or myalgias.  Past Medical History  Diagnosis Date  . Alcohol abuse   . Depression   . Suicide  attempt    Past Surgical History  Procedure Laterality Date  . Cosmetic surgery     No family history on file. History  Substance Use Topics  . Smoking status: Never Smoker   . Smokeless tobacco: Not on file  . Alcohol Use: 18.0 oz/week    30 Glasses of wine per week   OB History   Grav Para Term Preterm Abortions TAB SAB Ect Mult Living                 Review of Systems  Gastrointestinal: Negative for nausea and vomiting.  Musculoskeletal: Positive for myalgias (intermittently after drinking. none now).  Neurological: Negative for weakness.  Psychiatric/Behavioral: Negative for suicidal ideas.  All other systems reviewed and are negative.     Allergies  Review of patient's allergies indicates no known allergies.  Home Medications   Prior to Admission medications   Medication Sig Start Date End Date Taking? Authorizing Provider  buPROPion (WELLBUTRIN XL) 150 MG 24 hr tablet Take 150 mg by mouth daily.    Historical Provider, MD  Cholecalciferol (VITAMIN D PO) Take 1 tablet by mouth daily.    Historical Provider, MD  doxycycline (VIBRAMYCIN) 100 MG capsule Take 1 capsule (100 mg total) by mouth 2 (two) times daily. 01/26/14   Tatyana A Kirichenko, PA-C  HYDROcodone-acetaminophen (NORCO/VICODIN) 5-325 MG per tablet Take 1 tablet by mouth every 4 (four) hours as needed. 01/26/14  Tatyana A Kirichenko, PA-C  HYDROCODONE-ACETAMINOPHEN PO Take by mouth once.    Historical Provider, MD  ibuprofen (ADVIL,MOTRIN) 800 MG tablet Take 800 mg by mouth every 6 (six) hours as needed for moderate pain.    Historical Provider, MD  ibuprofen (ADVIL,MOTRIN) 800 MG tablet Take 1 tablet (800 mg total) by mouth 3 (three) times daily. 01/26/14   Tatyana A Kirichenko, PA-C  OVER THE COUNTER MEDICATION Take 1 packet by mouth daily. "Emergen-C" vitamin supplement.    Historical Provider, MD   BP 115/72  Pulse 120  Temp(Src) 98.7 F (37.1 C) (Oral)  Resp 20  SpO2 95% Physical Exam  Nursing  note and vitals reviewed. Constitutional: She is oriented to person, place, and time. She appears well-developed and well-nourished.  HENT:  Head: Normocephalic and atraumatic.  Right Ear: External ear normal.  Left Ear: External ear normal.  Nose: Nose normal.  Eyes: Right eye exhibits no discharge. Left eye exhibits no discharge.  Cardiovascular: Regular rhythm and normal heart sounds.  Tachycardia present.   Mild tachycardia  Pulmonary/Chest: Effort normal and breath sounds normal.  Abdominal: Soft. There is no tenderness.  Musculoskeletal:  No muscle tenderness in large muscle groups  Neurological: She is alert and oriented to person, place, and time. She displays no tremor.  Skin: Skin is warm and dry.  Psychiatric: Her mood appears not anxious. Her speech is not slurred. She is not combative. She expresses no homicidal and no suicidal ideation.    ED Course  Procedures (including critical care time) Labs Review Labs Reviewed  CBC - Abnormal; Notable for the following:    RBC 3.60 (*)    HCT 34.8 (*)    All other components within normal limits  ETHANOL - Abnormal; Notable for the following:    Alcohol, Ethyl (B) 316 (*)    All other components within normal limits  SALICYLATE LEVEL - Abnormal; Notable for the following:    Salicylate Lvl <2.0 (*)    All other components within normal limits  ACETAMINOPHEN LEVEL  COMPREHENSIVE METABOLIC PANEL  URINE RAPID DRUG SCREEN (HOSP PERFORMED)  POC URINE PREG, ED    Imaging Review No results found.   EKG Interpretation None      MDM   Final diagnoses:  Alcohol abuse    Patient denies current SI and appears to mention suicide only when intoxicated. K is normal here, no muscle abnormalities on exam and is currently asymptomatic. Her mom is overall concerned about her well-being and her ability to take care of herself. ACT consulted, they will disposition patient as she appears medically cleared.    Audree CamelScott T Goldston,  MD 03/10/14 1640

## 2014-03-10 NOTE — Consult Note (Signed)
[facility] Face-to-Face Psychiatry Consult   Reason for Consult:  Drinking too much alcohol Referring Physician:  ER MD  Kelsey [name] is an 43 y.o. female. Total Time spent with patient: 30 minutes  Assessment: AXIS I:  Alcohol Abuse AXIS II:  Deferred AXIS III:   Past Medical History  Diagnosis Date  . Alcohol abuse   . Depression   . Suicide attempt    AXIS IV:  misuse of alcohol AXIS V:  61-70 mild symptoms  Plan:  No evidence of imminent risk to self or others at present.    Subjective:   Kelsey [name] is a 43 y.o. female patient admitted with alcohol misuse.  HPI:  Ms Kelsey [name] says she does binge drink but only with her periods.  She has done this for years, she says and has been to rehab before.  Rehab was helpful only in the sense that she was away from the alcohol, not that she learned anything new, she said.  She does not need detox, does not want rehab, is not suicidal and requests discharge.  Her period has ended and the binge is over she says.  She drank excessively last night and this morning just in case she was about to be locked up.  She admits she sometimes says she is suicidal but never really is. HPI Elements:   Location:  alcohol misuse. Quality:  excessive drinking with her periods. Severity:  not daily use. Timing:  as above. Duration:  years. Context:  only with periods.  Past Psychiatric History: Past Medical History  Diagnosis Date  . Alcohol abuse   . Depression   . Suicide attempt     reports that she has never smoked. She does not have any smokeless tobacco history on file. She reports that she drinks about 18 ounces of alcohol per week. She reports that she does not use illicit drugs. No family history on file. Family History Substance Abuse: No Family Supports: Yes, List: (mother) Living Arrangements: Alone Can pt return to current living arrangement?: Yes   Allergies:  No Known Allergies  ACT Assessment Complete:  Yes:     Educational Status    Risk to Self: Risk to self Suicidal Ideation: No Suicidal Intent: No Is patient at risk for suicide?: No Suicidal Plan?: No Access to Means: No What has been your use of drugs/alcohol within the last 12 months?: alaochol  Previous Attempts/Gestures: Yes (teenager ) How many times?: 1 Other Self Harm Risks: no Triggers for Past Attempts: Other (Comment) (medication interraction antidepressant and birth control ) Intentional Self Injurious Behavior: None Family Suicide History: No Recent stressful life event(s): Other (Comment) (unemployeed, living with mother ) Persecutory voices/beliefs?: No Depression: No Substance abuse history and/or treatment for substance abuse?: Yes Suicide prevention information given to non-admitted patients: Not applicable  Risk to Others: Risk to Others Homicidal Ideation: No Thoughts of Harm to Others: No Current Homicidal Intent: No Current Homicidal Plan: No Access to Homicidal Means: No Identified Victim: n/a History of harm to others?: No Assessment of Violence: None Noted Violent Behavior Description: none Does patient have access to weapons?: No Criminal Charges Pending?: No Does patient have a court date: No  Abuse:    Prior Inpatient Therapy: Prior Inpatient Therapy Prior Inpatient Therapy: Yes Prior Therapy Dates: several Prior Therapy Facilty/Provider(s): several Reason for Treatment: detox and depression  Prior Outpatient Therapy: Prior Outpatient Therapy Prior Outpatient Therapy: No  Additional Information: Additional Information 1:1 In Past 12 Months?: No   CIRT Risk: No Elopement Risk: No Does patient have medical clearance?: Yes                  Objective: Blood pressure 115/72, pulse 120, temperature 98.7 F (37.1 C), temperature source Oral, resp. rate 20, SpO2 95.00%.There is no height or weight on file to calculate BMI. Results for orders placed during the hospital encounter of  03/10/14 (from the past 72 hour(s))  ACETAMINOPHEN LEVEL     Status: None   Collection Time    03/10/14  9:06 AM      Result Value Ref Range   Acetaminophen (Tylenol), Serum <15.0  10 - 30 ug/mL   Comment:            THERAPEUTIC CONCENTRATIONS VARY     SIGNIFICANTLY. A RANGE OF 10-30     ug/mL MAY BE AN EFFECTIVE     CONCENTRATION FOR MANY PATIENTS.     HOWEVER, SOME ARE BEST TREATED     AT CONCENTRATIONS OUTSIDE THIS     RANGE.     ACETAMINOPHEN CONCENTRATIONS     >150 ug/mL AT 4 HOURS AFTER     INGESTION AND >50 ug/mL AT 12     HOURS AFTER INGESTION ARE     OFTEN ASSOCIATED WITH TOXIC     REACTIONS.  CBC     Status: Abnormal   Collection Time    03/10/14  9:06 AM      Result Value Ref Range   WBC 6.7  4.0 - 10.5 K/uL   RBC 3.60 (*) 3.87 - 5.11 MIL/uL   Hemoglobin 12.1  12.0 - 15.0 g/dL   HCT 34.8 (*) 36.0 - 46.0 %   MCV 96.7  78.0 - 100.0 fL   MCH 33.6  26.0 - 34.0 pg   MCHC 34.8  30.0 - 36.0 g/dL   RDW 14.1  11.5 - 15.5 %   Platelets 300  150 - 400 K/uL  COMPREHENSIVE METABOLIC PANEL     Status: None   Collection Time    03/10/14  9:06 AM      Result Value Ref Range   Sodium 142  137 - 147 mEq/L   Potassium 3.8  3.7 - 5.3 mEq/L   Chloride 103  96 - 112 mEq/L   CO2 25  19 - 32 mEq/L   Glucose, Bld 92  70 - 99 mg/dL   BUN 13  6 - 23 mg/dL   Creatinine, Ser 0.77  0.50 - 1.10 mg/dL   Calcium 9.0  8.4 - 10.5 mg/dL   Total Protein 7.1  6.0 - 8.3 g/dL   Albumin 3.9  3.5 - 5.2 g/dL   AST 26  0 - 37 U/L   ALT 14  0 - 35 U/L   Alkaline Phosphatase 42  39 - 117 U/L   Total Bilirubin 0.5  0.3 - 1.2 mg/dL   GFR calc non Af Amer >90  >90 mL/min   GFR calc Af Amer >90  >90 mL/min   Comment: (NOTE)     The eGFR has been calculated using the CKD EPI equation.     This calculation has not been validated in all clinical situations.     eGFR's persistently <90 mL/min signify possible Chronic Kidney     Disease.  ETHANOL     Status: Abnormal   Collection Time    03/10/14   9:06 AM      Result Value Ref Range   Alcohol, Ethyl (B)  316 (*) 0 - 11 mg/dL   Comment:            LOWEST DETECTABLE LIMIT FOR     SERUM ALCOHOL IS 11 mg/dL     FOR MEDICAL PURPOSES ONLY  SALICYLATE LEVEL     Status: Abnormal   Collection Time    03/10/14  9:06 AM      Result Value Ref Range   Salicylate Lvl <1.5 (*) 2.8 - 20.0 mg/dL  URINE RAPID DRUG SCREEN (HOSP PERFORMED)     Status: None   Collection Time    03/10/14  9:18 AM      Result Value Ref Range   Opiates NONE DETECTED  NONE DETECTED   Cocaine NONE DETECTED  NONE DETECTED   Benzodiazepines NONE DETECTED  NONE DETECTED   Amphetamines NONE DETECTED  NONE DETECTED   Tetrahydrocannabinol NONE DETECTED  NONE DETECTED   Barbiturates NONE DETECTED  NONE DETECTED   Comment:            DRUG SCREEN FOR MEDICAL PURPOSES     ONLY.  IF CONFIRMATION IS NEEDED     FOR ANY PURPOSE, NOTIFY LAB     WITHIN 5 DAYS.                LOWEST DETECTABLE LIMITS     FOR URINE DRUG SCREEN     Drug Class       Cutoff (ng/mL)     Amphetamine      1000     Barbiturate      200     Benzodiazepine   830     Tricyclics       940     Opiates          300     Cocaine          300     THC              50  POC URINE PREG, ED     Status: None   Collection Time    03/10/14  9:23 AM      Result Value Ref Range   Preg Test, Ur NEGATIVE  NEGATIVE   Comment:            THE SENSITIVITY OF THIS     METHODOLOGY IS >24 mIU/mL   Labs are reviewed and are pertinent for BAL of 316 at admission.  No current facility-administered medications for this encounter.   Current Outpatient Prescriptions  Medication Sig Dispense Refill  . buPROPion (WELLBUTRIN XL) 150 MG 24 hr tablet Take 150 mg by mouth daily.      . Multiple Vitamin (MULTIVITAMIN WITH MINERALS) TABS tablet Take 1 tablet by mouth daily.        Psychiatric Specialty Exam:     Blood pressure 115/72, pulse 120, temperature 98.7 F (37.1 C), temperature source Oral, resp. rate 20, SpO2  95.00%.There is no height or weight on file to calculate BMI.  General Appearance: Casual  Eye Contact::  Good  Speech:  Clear and Coherent  Volume:  Normal  Mood:  Euthymic  Affect:  Appropriate  Thought Process:  Coherent  Orientation:  Full (Time, Place, and Person)  Thought Content:  Negative  Suicidal Thoughts:  No  Homicidal Thoughts:  No  Memory:  Immediate;   Good Recent;   Good Remote;   Good  Judgement:  Good  Insight:  Good  Psychomotor Activity:  Normal  Concentration:  Good  Recall:  Good  Fund of Knowledge:Good  Language: Good  Akathisia:  Negative  Handed:  Right  AIMS (if indicated):     Assets:  Communication Skills Financial Resources/Insurance Housing Physical Health Social Support Talents/Skills Transportation  Sleep:      Musculoskeletal: Strength & Muscle Tone: within normal limits Gait & Station: normal Patient leans: N/A  Treatment Plan Summary: does not need detox, discharge home today to pursue treatment as she chooses  Gerald D Taylor 03/10/2014 12:24 PM 

## 2014-03-10 NOTE — BH Assessment (Signed)
Assessment Note  Kelsey [name] is an 43 y.o. female who presents to the ed voluntarily with pt mother. Pt mother reports to nurse that patient was telling everyone she was going to kill herself last night. CSW met with pt at bedside to complete assessment. Pt alert and oriented x4. Patient reports, I knew I was coming in for detox today, because my mom told me so I decided to make it worth it and "polish it off". Patient states that she drank approximately 2 bottles of wine last night and then finished off a third bottle this morning around 6am. Pt BAL 314 at 9am. Patient chipper during assessment and joking. Pt denies SI/HI/AH/VH. Pt reports that she only tells her mom she is SI when pt mother  threatens to kick her out of the house because it pulls her mothers heart strings.Pt reports her drinking is triggered by her monthly cycle. Pt reports she has been heavily drinking for the past 5 days while on her cycle, but now that it is completed she wants to just sleep it off. Pt reprots she does not drink this heavily when she is not on her cycle.    Pt reports she was living in [place] but sold her house and moved to [place] a few years ago to save money and live with pt mother. Pt reports she has been through detox several times and has been in multiple rehab programs throughout the state as well as long term residential facility in [place] .  Pt reports that she has plans to go to [facility] at the end of th month and is anxious about the flight. Pt reports she feels she struggles with anxiety. Pt reports she has been on multiple antidepressants and now only takes Wellbutrin but feels it doesn't help her anxiety and she only takes it to maintain her weight and control her appetite.   Pt reports she does not have custody of her 56 year old child, who lives with pt father.   Pt reports she doesn't want to stay for detox, and would prefer to get a prescription of phenergan and go home and sleep it off  like she usually does.    Axis I: Alcohol Abuse Axis II: Deferred Axis III:  Past Medical History  Diagnosis Date  . Alcohol abuse   . Depression   . Suicide attempt    Axis IV: economic problems, housing problems, occupational problems, other psychosocial or environmental problems, problems related to social environment and problems with primary support group Axis V: 41-50 serious symptoms  Past Medical History:  Past Medical History  Diagnosis Date  . Alcohol abuse   . Depression   . Suicide attempt     Past Surgical History  Procedure Laterality Date  . Cosmetic surgery      Family History: No family history on file.  Social History:  reports that she has never smoked. She does not have any smokeless tobacco history on file. She reports that she drinks about 18 ounces of alcohol per week. She reports that she does not use illicit drugs.  Additional Social History:  Alcohol / Drug Use History of alcohol / drug use?: Yes Substance #1 Name of Substance 1: Alcohol abuse  1 - Age of First Use: teens  CIWA: CIWA-Ar BP: 115/72 mmHg Pulse Rate: 120 COWS:    Allergies: No Known Allergies  Home Medications:  (Not in a hospital admission)  OB/GYN Status:  No LMP recorded. Patient is not currently  having periods (Reason: Other).  General Assessment Data Location of Assessment: [facility] Is this a Tele or Face-to-Face Assessment?: Face-to-Face Is this an Initial Assessment or a Re-assessment for this encounter?: Initial Assessment Living Arrangements: Alone Can pt return to current living arrangement?: Yes Admission Status: Voluntary Is patient capable of signing voluntary admission?: Yes Transfer from: Home Referral Source: Self/Family/Friend     [place] Living Arrangements: Alone Name of Psychiatrist: no Name of Therapist: no  Education Status Is patient currently in school?: No  Risk to self Suicidal Ideation: No Suicidal Intent: No Is  patient at risk for suicide?: No Suicidal Plan?: No Access to Means: No What has been your use of drugs/alcohol within the last 12 months?: alaochol  Previous Attempts/Gestures: Yes (teenager ) How many times?: 1 Other Self Harm Risks: no Triggers for Past Attempts: Other (Comment) (medication interraction antidepressant and birth control ) Intentional Self Injurious Behavior: None Family Suicide History: No Recent stressful life event(s): Other (Comment) (unemployeed, living with mother ) Persecutory voices/beliefs?: No Depression: No Substance abuse history and/or treatment for substance abuse?: Yes Suicide prevention information given to non-admitted patients: Not applicable  Risk to Others Homicidal Ideation: No Thoughts of Harm to Others: No Current Homicidal Intent: No Current Homicidal Plan: No Access to Homicidal Means: No Identified Victim: n/a History of harm to others?: No Assessment of Violence: None Noted Violent Behavior Description: none Does patient have access to weapons?: No Criminal Charges Pending?: No Does patient have a court date: No  Psychosis Hallucinations: None noted Delusions: None noted  Mental Status Report Appear/Hygiene: Other (Comment) (calm and casual ) Eye Contact: Good Motor Activity: Freedom of movement Speech: Logical/coherent Level of Consciousness: Alert Mood: Other (Comment) (complacent) Affect: Appropriate to circumstance Anxiety Level: Minimal Thought Processes: Coherent;Relevant Judgement: Impaired Orientation: Person;Place;Time;Situation Obsessive Compulsive Thoughts/Behaviors: None  Cognitive Functioning Concentration: Normal Memory: Recent Intact;Remote Intact IQ: Average Insight: Fair Impulse Control: Fair Appetite: Fair Sleep: No Change Total Hours of Sleep: 4 Vegetative Symptoms: None  ADLScreening [facility] Assessment Services) Patient's cognitive ability adequate to safely complete daily activities?:  Yes Patient able to express need for assistance with ADLs?: Yes Independently performs ADLs?: Yes (appropriate for developmental age)  Prior Inpatient Therapy Prior Inpatient Therapy: Yes Prior Therapy Dates: several Prior Therapy Facilty/Provider(s): several Reason for Treatment: detox and depression  Prior Outpatient Therapy Prior Outpatient Therapy: No  ADL Screening (condition at time of admission) Patient's cognitive ability adequate to safely complete daily activities?: Yes Patient able to express need for assistance with ADLs?: Yes Independently performs ADLs?: Yes (appropriate for developmental age)         Values / Beliefs Cultural Requests During Hospitalization: None Spiritual Requests During Hospitalization: None        Additional Information 1:1 In Past 12 Months?: No CIRT Risk: No Elopement Risk: No Does patient have medical clearance?: Yes     Disposition:  Disposition Initial Assessment Completed for this Encounter: Yes Disposition of Patient: Referred to  On Site Evaluation by:   Reviewed with Physician:    Edson Snowball 03/10/2014 10:10 AM

## 2014-03-10 NOTE — Consult Note (Signed)
Review of Systems   Constitutional: Negative.    HENT: Negative.    Eyes: Negative.    Respiratory: Negative.    Cardiovascular: Negative.    Gastrointestinal: Negative.    Genitourinary: Negative.    Musculoskeletal: Negative.    Skin: Negative.    Neurological: Negative.    Endo/Heme/Allergies: Negative.    Psychiatric/Behavioral: Negative.

## 2014-03-10 NOTE — [facility] Suicide Risk Assessment (Signed)
Suicide Risk Assessment  Discharge Assessment     Demographic Factors:  Caucasian  Total Time spent with patient: 30 minutes  Psychiatric Specialty Exam:     Blood pressure 115/72, pulse 120, temperature 98.7 F (37.1 C), temperature source Oral, resp. rate 20, SpO2 95.00%.There is no height or weight on file to calculate BMI.  General Appearance: Casual  Eye Contact::  Good  Speech:  Clear and Coherent  Volume:  Normal  Mood:  Euthymic  Affect:  Appropriate  Thought Process:  Coherent  Orientation:  Full (Time, Place, and Person)  Thought Content:  Negative  Suicidal Thoughts:  No  Homicidal Thoughts:  No  Memory:  Immediate;   Good Recent;   Good Remote;   Good  Judgement:  Good  Insight:  Good  Psychomotor Activity:  Normal  Concentration:  Good  Recall:  Good  Fund of Knowledge:Good  Language: Good  Akathisia:  Negative  Handed:  Right  AIMS (if indicated):     Assets:  ArchitectCommunication Skills Financial Resources/Insurance Housing Physical Health Social Support Talents/Skills Transportation  Sleep:       Musculoskeletal: Strength & Muscle Tone: within normal limits Gait & Station: normal Patient leans: N/A   Mental Status Per Nursing Assessment::   On Admission:     Current Mental Status by Physician: NA  Loss Factors: NA  Historical Factors: NA  Risk Reduction Factors:   Living with another person, especially a relative, Positive social support and Positive coping skills or problem solving skills  Continued Clinical Symptoms:  Alcohol/Substance Abuse/Dependencies  Cognitive Features That Contribute To Risk:  none    Suicide Risk:  Minimal: No identifiable suicidal ideation.  Patients presenting with no risk factors but with morbid ruminations; may be classified as minimal risk based on the severity of the depressive symptoms  Discharge Diagnoses:   AXIS I:  Alcohol Abuse AXIS II:  Deferred AXIS III:   Past Medical History   Diagnosis Date  . Alcohol abuse   . Depression   . Suicide attempt    AXIS IV:  problems with primary support group AXIS V:  61-70 mild symptoms  Plan Of Care/Follow-up recommendations:  Activity:  resume usual activity Diet:  resume usual diet  Is patient on multiple antipsychotic therapies at discharge:  No   Has Patient had three or more failed trials of antipsychotic monotherapy by history:  No  Recommended Plan for Multiple Antipsychotic Therapies: NA    Kelsey [name] 03/10/2014, 12:36 PM

## 2014-03-10 NOTE — ED Notes (Signed)
Pt reports she has binge drank 1x/ month x25 years. Can drink from 3-10 bottles of wine.

## 2014-03-10 NOTE — ED Notes (Signed)
Pt brought here by mother.  Mother states pt needs etoh detox.  Pt states that she is fine.  Mother states that pt was threatening suicide yesterday but pt adamantly denies this.  Last had etoh about 6 am.  States that she binge drinks but does not drink every day.  Mother states that pt has been drinking every day x 2 wks.  Pt denies this.  Pt and mother are arguing at this time.

## 2014-05-15 ENCOUNTER — Encounter (HOSPITAL_COMMUNITY): Payer: Self-pay

## 2014-05-15 ENCOUNTER — Encounter (HOSPITAL_COMMUNITY): Payer: Self-pay | Admitting: Emergency Medicine

## 2014-05-15 ENCOUNTER — Emergency Department (HOSPITAL_COMMUNITY)
Admission: EM | Admit: 2014-05-15 | Discharge: 2014-05-15 | Disposition: A | Discharge status: Other Acute Inpatient Hospital | Payer: Medicaid Other | Attending: Emergency Medicine | Admitting: Emergency Medicine

## 2014-05-15 ENCOUNTER — Inpatient Hospital Stay (HOSPITAL_COMMUNITY)
Admission: AD | Admit: 2014-05-15 | Discharge: 2014-05-18 | DRG: 897 | Disposition: A | Discharge status: Home / Self Care | Payer: Medicaid Other | Attending: Psychiatry | Admitting: Psychiatry

## 2014-05-15 DIAGNOSIS — D649 Anemia, unspecified: Secondary | ICD-10-CM | POA: Diagnosis not present

## 2014-05-15 DIAGNOSIS — F1010 Alcohol abuse, uncomplicated: Secondary | ICD-10-CM | POA: Diagnosis not present

## 2014-05-15 DIAGNOSIS — F1020 Alcohol dependence, uncomplicated: Principal | ICD-10-CM | POA: Diagnosis present

## 2014-05-15 DIAGNOSIS — F1994 Other psychoactive substance use, unspecified with psychoactive substance-induced mood disorder: Secondary | ICD-10-CM

## 2014-05-15 DIAGNOSIS — F329 Major depressive disorder, single episode, unspecified: Secondary | ICD-10-CM | POA: Diagnosis present

## 2014-05-15 DIAGNOSIS — R000 Tachycardia, unspecified: Secondary | ICD-10-CM | POA: Insufficient documentation

## 2014-05-15 DIAGNOSIS — M7981 Nontraumatic hematoma of soft tissue: Secondary | ICD-10-CM | POA: Diagnosis not present

## 2014-05-15 DIAGNOSIS — G4700 Insomnia, unspecified: Secondary | ICD-10-CM | POA: Diagnosis present

## 2014-05-15 DIAGNOSIS — Z7982 Long term (current) use of aspirin: Secondary | ICD-10-CM | POA: Insufficient documentation

## 2014-05-15 DIAGNOSIS — F411 Generalized anxiety disorder: Secondary | ICD-10-CM | POA: Diagnosis present

## 2014-05-15 DIAGNOSIS — Z79899 Other long term (current) drug therapy: Secondary | ICD-10-CM | POA: Insufficient documentation

## 2014-05-15 DIAGNOSIS — F10230 Alcohol dependence with withdrawal, uncomplicated: Secondary | ICD-10-CM

## 2014-05-15 DIAGNOSIS — R42 Dizziness and giddiness: Secondary | ICD-10-CM | POA: Diagnosis present

## 2014-05-15 LAB — CBC WITH DIFFERENTIAL/PLATELET
Basophils Absolute: 0 10*3/uL (ref 0.0–0.1)
Basophils Relative: 0 % (ref 0–1)
Eosinophils Absolute: 0 10*3/uL (ref 0.0–0.7)
Eosinophils Relative: 0 % (ref 0–5)
HCT: 36.3 % (ref 36.0–46.0)
Hemoglobin: 12.4 g/dL (ref 12.0–15.0)
Lymphocytes Relative: 4 % — ABNORMAL LOW (ref 12–46)
Lymphs Abs: 0.5 10*3/uL — ABNORMAL LOW (ref 0.7–4.0)
MCH: 33.2 pg (ref 26.0–34.0)
MCHC: 34.2 g/dL (ref 30.0–36.0)
MCV: 97.1 fL (ref 78.0–100.0)
Monocytes Absolute: 0.7 10*3/uL (ref 0.1–1.0)
Monocytes Relative: 5 % (ref 3–12)
Neutro Abs: 12.5 10*3/uL — ABNORMAL HIGH (ref 1.7–7.7)
Neutrophils Relative %: 91 % — ABNORMAL HIGH (ref 43–77)
Platelets: 205 10*3/uL (ref 150–400)
RBC: 3.74 MIL/uL — ABNORMAL LOW (ref 3.87–5.11)
RDW: 14.1 % (ref 11.5–15.5)
WBC: 13.7 10*3/uL — ABNORMAL HIGH (ref 4.0–10.5)

## 2014-05-15 LAB — COMPREHENSIVE METABOLIC PANEL
ALT: 19 U/L (ref 0–35)
AST: 34 U/L (ref 0–37)
Albumin: 4.6 g/dL (ref 3.5–5.2)
Alkaline Phosphatase: 41 U/L (ref 39–117)
Anion gap: 26 — ABNORMAL HIGH (ref 5–15)
BUN: 17 mg/dL (ref 6–23)
CO2: 19 mEq/L (ref 19–32)
Calcium: 9.5 mg/dL (ref 8.4–10.5)
Chloride: 94 mEq/L — ABNORMAL LOW (ref 96–112)
Creatinine, Ser: 0.77 mg/dL (ref 0.50–1.10)
GFR calc Af Amer: >90 mL/min (ref >90)
GFR calc non Af Amer: >90 mL/min (ref >90)
Glucose, Bld: 85 mg/dL (ref 70–99)
Potassium: 3.5 mEq/L — ABNORMAL LOW (ref 3.7–5.3)
Sodium: 139 mEq/L (ref 137–147)
Total Bilirubin: 0.9 mg/dL (ref 0.3–1.2)
Total Protein: 8.1 g/dL (ref 6.0–8.3)

## 2014-05-15 LAB — RAPID URINE DRUG SCREEN, HOSP PERFORMED
Amphetamines: NOT DETECTED
Barbiturates: NOT DETECTED
Benzodiazepines: NOT DETECTED
Cocaine: NOT DETECTED
Opiates: NOT DETECTED
Tetrahydrocannabinol: NOT DETECTED

## 2014-05-15 LAB — TROPONIN I: Troponin I: <0.3 ng/mL (ref <0.30)

## 2014-05-15 LAB — ETHANOL: Alcohol, Ethyl (B): <11 mg/dL (ref 0–11)

## 2014-05-15 MED ORDER — CHLORDIAZEPOXIDE HCL 25 MG PO CAPS
25.0000 mg | ORAL_CAPSULE | Freq: Four times a day (QID) | ORAL | Status: AC
  Administered 2014-05-16 – 2014-05-17 (×5): 25 mg via ORAL
  Filled 2014-05-15 (×4): qty 1

## 2014-05-15 MED ORDER — ADULT MULTIVITAMIN W/MINERALS CH
1.0000 | ORAL_TABLET | Freq: Every day | ORAL | Status: DC
  Administered 2014-05-16 – 2014-05-18 (×3): 1 via ORAL
  Filled 2014-05-15 (×6): qty 1

## 2014-05-15 MED ORDER — CHLORDIAZEPOXIDE HCL 25 MG PO CAPS: 25.0000 mg | ORAL_CAPSULE | Freq: Every day | ORAL | Status: DC

## 2014-05-15 MED ORDER — MAGNESIUM HYDROXIDE 400 MG/5ML PO SUSP: 30.0000 mL | Freq: Every day | ORAL | Status: DC | PRN

## 2014-05-15 MED ORDER — CHLORDIAZEPOXIDE HCL 25 MG PO CAPS
25.0000 mg | ORAL_CAPSULE | Freq: Three times a day (TID) | ORAL | Status: AC
  Administered 2014-05-17 – 2014-05-18 (×3): 25 mg via ORAL
  Filled 2014-05-15 (×4): qty 1

## 2014-05-15 MED ORDER — LOPERAMIDE HCL 2 MG PO CAPS: 2.0000 mg | ORAL_CAPSULE | ORAL | Status: DC | PRN

## 2014-05-15 MED ORDER — HYDROXYZINE HCL 25 MG PO TABS
25.0000 mg | ORAL_TABLET | Freq: Four times a day (QID) | ORAL | Status: DC | PRN
  Administered 2014-05-16: 25 mg via ORAL
  Filled 2014-05-15: qty 1

## 2014-05-15 MED ORDER — THIAMINE HCL 100 MG/ML IJ SOLN
100.0000 mg | Freq: Every day | INTRAMUSCULAR | Status: DC
  Administered 2014-05-15: 100 mg via INTRAVENOUS
  Filled 2014-05-15: qty 2

## 2014-05-15 MED ORDER — ACETAMINOPHEN 325 MG PO TABS: 650.0000 mg | ORAL_TABLET | Freq: Four times a day (QID) | ORAL | Status: DC | PRN

## 2014-05-15 MED ORDER — THIAMINE HCL 100 MG/ML IJ SOLN: 100.0000 mg | Freq: Once | INTRAMUSCULAR | Status: DC

## 2014-05-15 MED ORDER — CHLORDIAZEPOXIDE HCL 25 MG PO CAPS: 25.0000 mg | ORAL_CAPSULE | ORAL | Status: DC

## 2014-05-15 MED ORDER — VITAMIN B-1 100 MG PO TABS
100.0000 mg | ORAL_TABLET | Freq: Every day | ORAL | Status: DC
  Administered 2014-05-16 – 2014-05-18 (×3): 100 mg via ORAL
  Filled 2014-05-15 (×6): qty 1

## 2014-05-15 MED ORDER — TRAZODONE HCL 50 MG PO TABS
50.0000 mg | ORAL_TABLET | Freq: Every evening | ORAL | Status: DC | PRN
  Filled 2014-05-15: qty 14
  Filled 2014-05-15: qty 3
  Filled 2014-05-15: qty 1

## 2014-05-15 MED ORDER — ONDANSETRON 4 MG PO TBDP: 4.0000 mg | ORAL_TABLET | Freq: Four times a day (QID) | ORAL | Status: DC | PRN

## 2014-05-15 MED ORDER — LORAZEPAM 1 MG PO TABS
0.0000 mg | ORAL_TABLET | Freq: Four times a day (QID) | ORAL | Status: DC
  Administered 2014-05-15: 1 mg via ORAL
  Filled 2014-05-15: qty 1

## 2014-05-15 MED ORDER — VITAMIN B-1 100 MG PO TABS: 100.0000 mg | ORAL_TABLET | Freq: Every day | ORAL | Status: DC

## 2014-05-15 MED ORDER — LORAZEPAM 1 MG PO TABS: 0.0000 mg | ORAL_TABLET | Freq: Two times a day (BID) | ORAL | Status: DC

## 2014-05-15 MED ORDER — SODIUM CHLORIDE 0.9 % IV BOLUS (SEPSIS)
1000.0000 mL | Freq: Once | INTRAVENOUS | Status: AC
  Administered 2014-05-15: 1000 mL via INTRAVENOUS

## 2014-05-15 MED ORDER — ALUM & MAG HYDROXIDE-SIMETH 200-200-20 MG/5ML PO SUSP: 30.0000 mL | ORAL | Status: DC | PRN

## 2014-05-15 MED ORDER — CHLORDIAZEPOXIDE HCL 25 MG PO CAPS: 25.0000 mg | ORAL_CAPSULE | Freq: Four times a day (QID) | ORAL | Status: DC | PRN

## 2014-05-15 MED ORDER — ONDANSETRON HCL 4 MG/2ML IJ SOLN
4.0000 mg | Freq: Once | INTRAMUSCULAR | Status: AC
  Administered 2014-05-15: 4 mg via INTRAVENOUS
  Filled 2014-05-15: qty 2

## 2014-05-15 NOTE — Progress Notes (Signed)
Psychoeducational Group Note  Date:  05/15/2014 Time: 2100  Group Topic/Focus:  wrap up group  Participation Level: Did Not Attend  Participation Quality:  Not Applicable  Affect:  Not Applicable  Cognitive:  Not Applicable  Insight:  Not Applicable  Engagement in Group: Not Applicable  Additional Comments:  Pt was admitted to unit right before group and remained in her room during group time.   Shelah LewandowskySquires, Lindsay Carol 05/15/2014, 11:18 PM

## 2014-05-15 NOTE — ED Notes (Signed)
[facility] unable to take report will call back shortly

## 2014-05-15 NOTE — Progress Notes (Signed)
Patient ID: Kelsey [name], female   DOB: 10/19/1971, 43 y.o.   MRN: [id number]  Admission Note:  D: [age] yr female who presents [facility] in no acute distress for the treatment of ETOH and Depression. Pt appears flat and depressed. Pt was calm and cooperative with admission process. Pt denies SI/HI/AVH. Pt stated she went to the ER because she was dizzy and feeling nauseated, and thought why not get help while I'm here.pt stated she drinks 1 bottle of wine daily, but 2 nights before coming in she drank  And slept for 2 days and don't recall the amount of ETOH consumed.    A:Skin was assessed and found to be clear of any abnormal marks apart from bruises R-forearm, L-arm and kneecap. Marland Kitchen. POC and unit policies explained and understanding verbalized. Consents obtained. Food and fluids offered, and fluids accepted.  R: Pt had no additional questions or concerns.

## 2014-05-15 NOTE — Tx Team (Signed)
Initial Interdisciplinary Treatment Plan  PATIENT STRENGTHS: (choose at least two) General fund of knowledge Supportive family/friends  PATIENT STRESSORS: Substance abuse   PROBLEM LIST: Problem List/Patient Goals Date to be addressed Date deferred Reason deferred Estimated date of resolution  SA 05/15/14     Depression 05/15/14     Risk for suicide 05/15/14                                          DISCHARGE CRITERIA:  Improved stabilization in mood, thinking, and/or behavior Withdrawal symptoms are absent or subacute and managed without 24-hour nursing intervention  PRELIMINARY DISCHARGE PLAN: Attend aftercare/continuing care group Attend 12-step recovery group Outpatient therapy  PATIENT/FAMIILY INVOLVEMENT: This treatment plan has been presented to and reviewed with the patient, Raeanne BarryVictoria K Dewilde.  The patient and family have been given the opportunity to ask questions and make suggestions.  Jacques Navyhillips, Michael A 05/15/2014, 8:01 PM

## 2014-05-15 NOTE — ED Notes (Signed)
Pt states that she has been drinking for almost 30 years.  Drinks a bottle or two of wine per day.  C/o dizziness and vomiting that started this morning.  Denies LOC.  Last drink was yesterday afternoon.  Pt states that she is mainly here to find out why she is dizzy but would also like help with etoh.  Denies SI/HI.

## 2014-05-15 NOTE — ED Notes (Signed)
Pt aware of the need for a urine sample however, pt unable to void at this time.

## 2014-05-15 NOTE — ED Notes (Signed)
Pt mother (June Chou) [phone] (home)  [phone] (cell).

## 2014-05-15 NOTE — ED Provider Notes (Signed)
CSN: [id number]     Arrival date & time 05/15/14  [id number] History   First MD Initiated Contact with Patient 05/15/14 1014     Chief Complaint  Patient presents with  . Dizziness  . Etoh detox      (Consider location/radiation/quality/duration/timing/severity/associated sxs/prior Treatment) HPI Comments: Patient presents to the emergency department with chief complaint of dizziness. She states that she woke feeling dizzy this morning. She describes the dizziness as a feeling of being lightheaded. She denies any vertiginous symptoms. She has not tried taking anything to alleviate her symptoms. She states that she had several episodes of vomiting last night. She states that she is a heavy drinker. She would also like help with alcohol detox. She denies any SI or HI. She states that she drinks up to 5 bottles of wine per day. Additionally, she states that she has been taking caffeine and ephedrine. She also reports history of anemia, but denies any vaginal bleeding or blood in stool.  She states that she bruises very easily.  The history is provided by the patient. No language interpreter was used.    Past Medical History  Diagnosis Date  . Alcohol abuse   . Depression   . Suicide attempt    Past Surgical History  Procedure Laterality Date  . Cosmetic surgery     No family history on file. History  Substance Use Topics  . Smoking status: Never Smoker   . Smokeless tobacco: Not on file  . Alcohol Use: 18.0 oz/week    30 Glasses of wine per week   OB History   Grav Para Term Preterm Abortions TAB SAB Ect Mult Living                 Review of Systems  All other systems reviewed and are negative.     Allergies  Review of patient's allergies indicates no known allergies.  Home Medications   Prior to Admission medications   Medication Sig Start Date End Date Taking? Authorizing Provider  aspirin EC 81 MG tablet Take 81 mg by mouth daily.   Yes Historical Provider, MD   buPROPion (WELLBUTRIN XL) 150 MG 24 hr tablet Take 150 mg by mouth daily.   Yes Historical Provider, MD  caffeine 200 MG TABS tablet Take 200 mg by mouth every 4 (four) hours as needed (energy).   Yes Historical Provider, MD  ePHEDrine 25 MG capsule Take 25 mg by mouth every 4 (four) hours as needed (for energy).   Yes Historical Provider, MD  ferrous sulfate 325 (65 FE) MG tablet Take 325 mg by mouth daily with breakfast.   Yes Historical Provider, MD  ibuprofen (ADVIL,MOTRIN) 800 MG tablet Take 800 mg by mouth every 8 (eight) hours as needed for mild pain.   Yes Historical Provider, MD  Multiple Vitamin (MULTIVITAMIN WITH MINERALS) TABS tablet Take 1 tablet by mouth daily.   Yes Historical Provider, MD   BP 130/82  Pulse 120  Temp(Src) 98 F (36.7 C) (Oral)  Resp 19  SpO2 100% Physical Exam  Nursing note and vitals reviewed. Constitutional: She is oriented to person, place, and time. She appears well-developed and well-nourished.  HENT:  Head: Normocephalic and atraumatic.  Eyes: Conjunctivae and EOM are normal. Pupils are equal, round, and reactive to light.  Neck: Normal range of motion. Neck supple.  Cardiovascular: Regular rhythm.  Exam reveals no gallop and no friction rub.   No murmur heard. Tachycardic  Pulmonary/Chest: Effort normal and breath sounds  normal. No respiratory distress. She has no wheezes. She has no rales. She exhibits no tenderness.  Abdominal: Soft. Bowel sounds are normal. She exhibits no distension and no mass. There is no tenderness. There is no rebound and no guarding.  Musculoskeletal: Normal range of motion. She exhibits no edema and no tenderness.  Neurological: She is alert and oriented to person, place, and time.  Skin: Skin is warm and dry.  Scattered contusions on upper and lower extremity  Psychiatric: She has a normal mood and affect. Her behavior is normal. Judgment and thought content normal.    ED Course  Procedures (including critical care  time) Results for orders placed during the hospital encounter of 05/15/14  CBC WITH DIFFERENTIAL      Result Value Ref Range   WBC 13.7 (*) 4.0 - 10.5 K/uL   RBC 3.74 (*) 3.87 - 5.11 MIL/uL   Hemoglobin 12.4  12.0 - 15.0 g/dL   HCT 37.9  02.4 - 09.7 %   MCV 97.1  78.0 - 100.0 fL   MCH 33.2  26.0 - 34.0 pg   MCHC 34.2  30.0 - 36.0 g/dL   RDW 35.3  29.9 - 24.2 %   Platelets 205  150 - 400 K/uL   Neutrophils Relative % 91 (*) 43 - 77 %   Neutro Abs 12.5 (*) 1.7 - 7.7 K/uL   Lymphocytes Relative 4 (*) 12 - 46 %   Lymphs Abs 0.5 (*) 0.7 - 4.0 K/uL   Monocytes Relative 5  3 - 12 %   Monocytes Absolute 0.7  0.1 - 1.0 K/uL   Eosinophils Relative 0  0 - 5 %   Eosinophils Absolute 0.0  0.0 - 0.7 K/uL   Basophils Relative 0  0 - 1 %   Basophils Absolute 0.0  0.0 - 0.1 K/uL  COMPREHENSIVE METABOLIC PANEL      Result Value Ref Range   Sodium 139  137 - 147 mEq/L   Potassium 3.5 (*) 3.7 - 5.3 mEq/L   Chloride 94 (*) 96 - 112 mEq/L   CO2 19  19 - 32 mEq/L   Glucose, Bld 85  70 - 99 mg/dL   BUN 17  6 - 23 mg/dL   Creatinine, Ser 6.83  0.50 - 1.10 mg/dL   Calcium 9.5  8.4 - 41.9 mg/dL   Total Protein 8.1  6.0 - 8.3 g/dL   Albumin 4.6  3.5 - 5.2 g/dL   AST 34  0 - 37 U/L   ALT 19  0 - 35 U/L   Alkaline Phosphatase 41  39 - 117 U/L   Total Bilirubin 0.9  0.3 - 1.2 mg/dL   GFR calc non Af Amer >90  >90 mL/min   GFR calc Af Amer >90  >90 mL/min   Anion gap 26 (*) 5 - 15  URINE RAPID DRUG SCREEN (HOSP PERFORMED)      Result Value Ref Range   Opiates NONE DETECTED  NONE DETECTED   Cocaine NONE DETECTED  NONE DETECTED   Benzodiazepines NONE DETECTED  NONE DETECTED   Amphetamines NONE DETECTED  NONE DETECTED   Tetrahydrocannabinol NONE DETECTED  NONE DETECTED   Barbiturates NONE DETECTED  NONE DETECTED  ETHANOL      Result Value Ref Range   Alcohol, Ethyl (B) <11  0 - 11 mg/dL  TROPONIN I      Result Value Ref Range   Troponin I <0.30  <0.30 ng/mL   No results  found.   No results  found.   EKG Interpretation None      MDM   Final diagnoses:  None    Needs help with detox.  Dizziness is likely related to dehydration.  Feels better now.  TTS consult pending.      Roxy Horseman, PA-C 05/15/14 1552

## 2014-05-15 NOTE — ED Notes (Signed)
[facility] 5[facility]oopm

## 2014-05-15 NOTE — ED Notes (Signed)
Denies SI or HI 

## 2014-05-15 NOTE — ED Provider Notes (Signed)
  Physical Exam  BP 142/88  Pulse 89  Temp(Src) 98.6 F (37 C) (Oral)  Resp 17  SpO2 100%  Physical Exam  ED Course  Procedures  [facility] Stable for transfer.       Richardean Canalavid H Yao, MD 05/15/14 [phone]

## 2014-05-15 NOTE — ED Notes (Signed)
TTS is at bedside 

## 2014-05-15 NOTE — ED Notes (Addendum)
[facility] w[facility]wo difficulty.  Belongings given go driver.

## 2014-05-15 NOTE — ED Notes (Signed)
[facility] contacted for transport 

## 2014-05-15 NOTE — BH Assessment (Signed)
Assessment Note  Kelsey [name] is an 43 y.o. female. Patient was brought into the ED by [name] friend for alcohol detox.  Patient reports drinking 1-2 bottles of wine daily and while binging 3 or more bottles of wine (Pinot Grigio).  Patient reports drinking for the past 25 years and the longest period of sobriety was during [name] pregnancy for 8 months.  Patient reports the negative consequences of her drinking includes 3 DWIs, relationship conflicts, and inability to keep [name] job.  Patient reports current withdrawal symtpoms including shakes, nausea, numbness in extrimities, and vomiting.  Patient denies history of DTs or seizures.  Patient denies SI/HI, hallucinations, and other self injurious behaviors.    Patient reports past treatment history includes 2012 [facility] twice and [facility] in 2014.  Patient reports the longest period of sobriety after treatment was one month.    CSW ran patient with Dr. Tawni Carnes it is recommended for inpatient detox for safety and stabilization.  CSW spoke with Kelsey [name] who reports potential bed and he will call back with confirmed room.     Axis I: Substance Abuse and Alcohol Dependence Axis II: Deferred Axis III:  Past Medical History  Diagnosis Date  . Alcohol abuse   . Depression   . Suicide attempt    Axis IV: economic problems, housing problems, occupational problems, other psychosocial or environmental problems, problems related to legal system/crime, problems related to social environment, problems with access to health care services and problems with primary support group Axis V: 41-50 serious symptoms  Past Medical History:  Past Medical History  Diagnosis Date  . Alcohol abuse   . Depression   . Suicide attempt     Past Surgical History  Procedure Laterality Date  . Cosmetic surgery      Family History: No family history on file.  Social History:  reports that she has never smoked. She does not have any smokeless tobacco history on  file. She reports that she drinks about 18 ounces of alcohol per week. She reports that she does not use illicit drugs.  Additional Social History:     CIWA: CIWA-Ar BP: 121/85 mmHg Pulse Rate: 93 Nausea and Vomiting: 2 Tactile Disturbances: none Tremor: two Auditory Disturbances: not present Paroxysmal Sweats: no sweat visible Visual Disturbances: not present Anxiety: mildly anxious Headache, Fullness in Head: none present Agitation: normal activity Orientation and Clouding of Sensorium: oriented and can do serial additions CIWA-Ar Total: 5 COWS:    Allergies: No Known Allergies  Home Medications:  (Not in [name] hospital admission)  OB/GYN Status:  No LMP recorded. Patient is not currently having periods (Reason: Other).  General Assessment Data Location of Assessment: [facility] ACT Assessment: Yes Is this [name] Tele or Face-to-Face Assessment?: Face-to-Face Is this an Initial Assessment or [name] Re-assessment for this encounter?: Initial Assessment Living Arrangements: Parent Can pt return to current living arrangement?: Yes Admission Status: Voluntary Is patient capable of signing voluntary admission?: Yes Transfer from: Home Referral Source: Self/Family/Friend  Medical Screening Exam [facility] Walk-in ONLY) Medical Exam completed: Yes  [facility] Crisis Care Plan Living Arrangements: Parent Name of Psychiatrist: none Name of Therapist: none  Education Status Is patient currently in school?: No  Risk to self Suicidal Ideation: No-Not Currently/Within Last 6 Months Suicidal Intent: No-Not Currently/Within Last 6 Months Is patient at risk for suicide?: No Suicidal Plan?: No-Not Currently/Within Last 6 Months What has been your use of drugs/alcohol within the last 12 months?: alcohol (1-2 bottles wine daily, beinge  3 or more bottles wine) Previous Attempts/Gestures: No Intentional Self Injurious Behavior: None Family Suicide History: No Recent stressful life event(s): Conflict  (Comment);Legal Issues (alcohol use) Persecutory voices/beliefs?: No Depression: Yes Depression Symptoms: Fatigue;Loss of interest in usual pleasures Substance abuse history and/or treatment for substance abuse?: Yes  Risk to Others Homicidal Ideation: No-Not Currently/Within Last 6 Months Thoughts of Harm to Others: No-Not Currently Present/Within Last 6 Months Current Homicidal Intent: No-Not Currently/Within Last 6 Months Current Homicidal Plan: No-Not Currently/Within Last 6 Months History of harm to others?: No Assessment of Violence: None Noted Does patient have access to weapons?: No Does patient have [name] court date: No  Psychosis Hallucinations: None noted Delusions: None noted  Mental Status Report Appear/Hygiene: In hospital gown Eye Contact: Good Motor Activity: Freedom of movement Speech: Logical/coherent Level of Consciousness: Alert Mood: Depressed Affect: Depressed Anxiety Level: Minimal Thought Processes: Coherent Judgement: Unimpaired Orientation: Person;Time;Place;Situation Obsessive Compulsive Thoughts/Behaviors: None  Cognitive Functioning Concentration: Normal Memory: Recent Intact;Remote Intact IQ: Average Insight: Good Impulse Control: Good Appetite: Fair Sleep: No Change Vegetative Symptoms: None  ADLScreening [facility] Assessment Services) Patient's cognitive ability adequate to safely complete daily activities?: Yes Patient able to express need for assistance with ADLs?: Yes Independently performs ADLs?: Yes (appropriate for developmental age)  Prior Inpatient Therapy Prior Inpatient Therapy: Yes Prior Therapy Dates: 2012 [facility] Reason for Treatment: Alcohol  Prior Outpatient Therapy Prior Outpatient Therapy: Yes Prior Therapy Dates: 2014 Prior Therapy Facilty/Provider(s): [facility] Reason for Treatment: alcohol  ADL Screening (condition at time of admission) Patient's cognitive ability  adequate to safely complete daily activities?: Yes Patient able to express need for assistance with ADLs?: Yes Independently performs ADLs?: Yes (appropriate for developmental age)         Values / Beliefs Cultural Requests During Hospitalization: None Spiritual Requests During Hospitalization: None        Additional Information 1:1 In Past 12 Months?: No CIRT Risk: No Elopement Risk: No Does patient have medical clearance?: Yes     Disposition:  Disposition Initial Assessment Completed for this Encounter: Yes Disposition of Patient: Inpatient treatment program Type of inpatient treatment program: Adult  On Site Evaluation by:   Reviewed with Physician:    Kelsey [name], Kelsey [name] 05/15/2014 1:15 PM

## 2014-05-15 NOTE — Progress Notes (Signed)
[facility] Minerva AreolaEric and bed will be ready for transfer after 3pm.  CSW and patient reviewed the voluntary admission paperwork and she signed willing for treatment.    Kelsey [name], MSW, BudLCSWA, 05/15/2014 Evening Clinical Social Worker [phone]

## 2014-05-15 NOTE — ED Notes (Signed)
Attempted to call report to secure holding area.RN will call back

## 2014-05-16 NOTE — Progress Notes (Addendum)
D.  Pt pleasant on approach, denies complaints at this time.  Denies SI/HI/hallucinations at this time.  Positive for evening AA group with appropriate participation.  Interacting appropriately with peers on the unit.  Day shift RN had 45 minute conversation with Pt's mother and stated that mother feels that Pt may have an underlying Bipolar disorder that has yet to diagnosed and treated.  A.  Support and encouragement offered  R.  Pt remains safe on unit, will continue to monitor.

## 2014-05-16 NOTE — [facility] Group Notes (Signed)
[facility] Group Notes:  (Nursing/MHT/Case Management/Adjunct)  Date:  05/16/2014  Time:  4:16 PM  Type of Therapy:  Psychoeducational Skills  Participation Level:  Active  Participation Quality:  Appropriate  Affect:  Appropriate  Cognitive:  Appropriate  Insight:  Appropriate  Engagement in Group:  Engaged  Modes of Intervention:  Discussion  Summary of Progress/Problems: Pt did attend self inventory group, pt reported that she was negative SI/HI, no AH/VH noted. Pt rated her depression as a 1, and her helplessness/hopelessness as a 0.     Pt reported no issues or concerns.    Kelsey [name], Kelsey [name] 05/16/2014, 4:16 PM

## 2014-05-16 NOTE — H&P (Signed)
Psychiatric Admission Assessment Adult  Patient Identification:  Kelsey [name] Date of Evaluation:  05/16/2014 Chief Complaint:  ETOH DEPENDENCE History of Present Illness:: This is a 43 years old female who was admitted from [facility] last night for alcohol detoxification.  Patient went to the ER yesterday with the c/o feeling light headed and dizzy.  Patient also requested detox from Alcohol.  Patient reported binge drinking all day Wednesday, Thursday and early morning of Friday before going to work.  Patient states she has had problems with alcoholism for a long time and he had a handful of detox treatment.  This is her first detox treatment here in our [facility] and she was last detoxed at [facility] 18 months ago.  Patient reports she experimented with Alcohol at age 29 but started drinking regularly from age 4.  She mask her drinking by not drinking at work at [employer].  Patient has a son 33 years old who lives with his father.  She has support of her family here in [place].  She denies feeling depressed but has anxiety issues.  She also stated that her anxiety disorder has improved since starting Wellbutrin.  Patient reports poor sleep and fair appetite.  Patient has two jobs that keeps her busy but she comes home to drink.  Patient reports she can drink one to two bottles of wine a day.  She denies SI/HI /AVH.  She has a remote hx of suicide attempt by cutting her wrist when she was having a bed reaction to Depo prevera birth control implant.  As soon as she was taken off the implant she felt better.  She is admitted to our Chemical dependency unit  For alcohol detox.  We are using our Librium protocol for her detox.  Patient has shown interest in outpatient intensive treatment on discharge.  Elements:  Location:  Alcohol withdrawal symptoms, light headedness, vomiting, nausea. Quality:  severe, needing help detoxing from Alcohol. Severity:  severe. Duration:  Have been  drinking for about 30 years, sober for a month only. Associated Signs/Synptoms: Depression Symptoms:  anxiety, disturbed sleep, (Hypo) Manic Symptoms:  NA Anxiety Symptoms:  Excessive Worry, Social Anxiety, Psychotic Symptoms:  NA PTSD Symptoms: NA Total Time spent with patient: 1 hour  Psychiatric Specialty Exam: Physical Exam  ROS  Blood pressure 119/90, pulse 97, temperature 98.5 F (36.9 C), temperature source Oral, resp. rate 16, height _0  (1.702 m), weight 143 lb (64.864 kg), last menstrual period 05/05/2014.Body mass index is 22.39 kg/(m^2).  General Appearance: Casual and Disheveled  Eye Contact::  Good  Speech:  Clear and Coherent and Normal Rate  Volume:  Normal  Mood:  Anxious and Depressed  Affect:  Depressed  Thought Process:  Coherent and Goal Directed  Orientation:  Full (Time, Place, and Person)  Thought Content:  WDL  Suicidal Thoughts:  No  Homicidal Thoughts:  No  Memory:  Immediate;   Good Recent;   Good Remote;   Good  Judgement:  Fair  Insight:  Good  Psychomotor Activity:  Normal  Concentration:  Good  Recall:  NA  Fund of Knowledge:Good  Language: Good  Akathisia:  NA  Handed:  Right  AIMS (if indicated):     Assets:  Desire for Improvement  Sleep:  Number of Hours: 6.75    Musculoskeletal: Strength & Muscle Tone: within normal limits Gait & Station: normal Patient leans: N/A  Past Psychiatric History: Diagnosis:Alcohol Dependence  Hospitalizations: [facility]  Outpatient  Care:None  Substance Abuse Care: Several places, [facility]  Self-Mutilation: Cut wrist at age 38 when having reaction to birth control implant  Suicidal Attempts: Cut wrist once at age 89  Violent Behaviors: no   Past Medical History:   Past Medical History  Diagnosis Date  . Alcohol abuse   . Depression   . Suicide attempt    None. Allergies:  No Known Allergies PTA Medications: Prescriptions prior to admission  Medication Sig  Dispense Refill  . aspirin EC 81 MG tablet Take 81 mg by mouth daily.      Marland Kitchen buPROPion (WELLBUTRIN XL) 150 MG 24 hr tablet Take 150 mg by mouth daily.      . caffeine 200 MG TABS tablet Take 200 mg by mouth every 4 (four) hours as needed (energy).      Marland Kitchen ePHEDrine 25 MG capsule Take 25 mg by mouth every 4 (four) hours as needed (for energy).      . ferrous sulfate 325 (65 FE) MG tablet Take 325 mg by mouth daily with breakfast.      . ibuprofen (ADVIL,MOTRIN) 800 MG tablet Take 800 mg by mouth every 8 (eight) hours as needed for mild pain.      . Multiple Vitamin (MULTIVITAMIN WITH MINERALS) TABS tablet Take 1 tablet by mouth daily.        Previous Psychotropic Medications:  None  Medication/Dose                 Substance Abuse History in the last 12 months:  Yes.    Consequences of Substance Abuse: Medical Consequences:  Dizziness, tremors excessive seating  Social History:  reports that she has never smoked. She does not have any smokeless tobacco history on file. She reports that she drinks about 18 ounces of alcohol per week. She reports that she does not use illicit drugs. Additional Social History:   Current Place of Residence:   Place of Birth:   Family Members: Marital Status:  Divorced/ Single Children:  Sons: 44 years  Daughters: Relationships: Education:  Dentist Problems/Performance: Religious Beliefs/Practices: History of Abuse (Emotional/Phsycial/Sexual) Ship broker History:  None. Legal History: Hobbies/Interests:  Family History:  History reviewed. No pertinent family history.  Results for orders placed during the hospital encounter of 05/15/14 (from the past 72 hour(s))  CBC WITH DIFFERENTIAL     Status: Abnormal   Collection Time    05/15/14 10:56 AM      Result Value Ref Range   WBC 13.7 (*) 4.0 - 10.5 K/uL   RBC 3.74 (*) 3.87 - 5.11 MIL/uL   Hemoglobin 12.4  12.0 - 15.0 g/dL   HCT 36.3  36.0 - 46.0 %    MCV 97.1  78.0 - 100.0 fL   MCH 33.2  26.0 - 34.0 pg   MCHC 34.2  30.0 - 36.0 g/dL   RDW 14.1  11.5 - 15.5 %   Platelets 205  150 - 400 K/uL   Neutrophils Relative % 91 (*) 43 - 77 %   Neutro Abs 12.5 (*) 1.7 - 7.7 K/uL   Lymphocytes Relative 4 (*) 12 - 46 %   Lymphs Abs 0.5 (*) 0.7 - 4.0 K/uL   Monocytes Relative 5  3 - 12 %   Monocytes Absolute 0.7  0.1 - 1.0 K/uL   Eosinophils Relative 0  0 - 5 %   Eosinophils Absolute 0.0  0.0 - 0.7 K/uL   Basophils Relative 0  0 -  1 %   Basophils Absolute 0.0  0.0 - 0.1 K/uL  COMPREHENSIVE METABOLIC PANEL     Status: Abnormal   Collection Time    05/15/14 10:56 AM      Result Value Ref Range   Sodium 139  137 - 147 mEq/L   Potassium 3.5 (*) 3.7 - 5.3 mEq/L   Chloride 94 (*) 96 - 112 mEq/L   CO2 19  19 - 32 mEq/L   Glucose, Bld 85  70 - 99 mg/dL   BUN 17  6 - 23 mg/dL   Creatinine, Ser 0.77  0.50 - 1.10 mg/dL   Calcium 9.5  8.4 - 10.5 mg/dL   Total Protein 8.1  6.0 - 8.3 g/dL   Albumin 4.6  3.5 - 5.2 g/dL   AST 34  0 - 37 U/L   ALT 19  0 - 35 U/L   Alkaline Phosphatase 41  39 - 117 U/L   Total Bilirubin 0.9  0.3 - 1.2 mg/dL   GFR calc non Af Amer >90  >90 mL/min   GFR calc Af Amer >90  >90 mL/min   Comment: (NOTE)     The eGFR has been calculated using the CKD EPI equation.     This calculation has not been validated in all clinical situations.     eGFR's persistently <90 mL/min signify possible Chronic Kidney     Disease.   Anion gap 26 (*) 5 - 15   Comment: RESULT REPEATED AND VERIFIED  URINE RAPID DRUG SCREEN (HOSP PERFORMED)     Status: None   Collection Time    05/15/14 11:55 AM      Result Value Ref Range   Opiates NONE DETECTED  NONE DETECTED   Cocaine NONE DETECTED  NONE DETECTED   Benzodiazepines NONE DETECTED  NONE DETECTED   Amphetamines NONE DETECTED  NONE DETECTED   Tetrahydrocannabinol NONE DETECTED  NONE DETECTED   Barbiturates NONE DETECTED  NONE DETECTED   Comment:            DRUG SCREEN FOR MEDICAL PURPOSES      ONLY.  IF CONFIRMATION IS NEEDED     FOR ANY PURPOSE, NOTIFY LAB     WITHIN 5 DAYS.                LOWEST DETECTABLE LIMITS     FOR URINE DRUG SCREEN     Drug Class       Cutoff (ng/mL)     Amphetamine      1000     Barbiturate      200     Benzodiazepine   956     Tricyclics       213     Opiates          300     Cocaine          300     THC              50  ETHANOL     Status: None   Collection Time    05/15/14  1:12 PM      Result Value Ref Range   Alcohol, Ethyl (B) <11  0 - 11 mg/dL   Comment:            LOWEST DETECTABLE LIMIT FOR     SERUM ALCOHOL IS 11 mg/dL     FOR MEDICAL PURPOSES ONLY  TROPONIN I     Status: None  Collection Time    05/15/14  1:12 PM      Result Value Ref Range   Troponin I <0.30  <0.30 ng/mL   Comment:            Due to the release kinetics of cTnI,     a negative result within the first hours     of the onset of symptoms does not rule out     myocardial infarction with certainty.     If myocardial infarction is still suspected,     repeat the test at appropriate intervals.   Psychological Evaluations:  Assessment:   DSM5:  Schizophrenia Disorders:  NA Obsessive-Compulsive Disorders:  NA Trauma-Stressor Disorders:  NA Substance/Addictive Disorders:  Alcohol Related Disorder - Severe (303.90) Depressive Disorders:  Denies she is depressed however is taking Wellbutrin daily for anxiety per patient.   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 Alcohol abuse   . Depression   . Suicide attempt    AXIS IV:  other psychosocial or environmental problems and problems related to social environment AXIS V:  41-50 serious symptoms  Treatment Plan/Recommendations:   Admit for crisis management/stabilization. Review and reinstate any pertinent home medications for other health issues.   Medication management to treat current mood problems. We are using  our Librium protocol for our Alcohol detoxification protocol Resume Wellbutrin 150 mg, XL 24hr po daily for anxiety/Depression Trazodone 50 mg po QHS  As needed for sleep Group counseling sessions and activities. Primary care consults as needed. Continue current treatment plan .  Treatment Plan Summary: Daily contact with patient to assess and evaluate symptoms and progress in treatment Medication management Current Medications:  Current Facility-Administered Medications  Medication Dose Route Frequency Provider Last Rate Last Dose  . acetaminophen (TYLENOL) tablet 650 mg  650 mg Oral Q6H PRN Charlcie Cradle, MD      . alum & mag hydroxide-simeth (MAALOX/MYLANTA) 200-200-20 MG/5ML suspension 30 mL  30 mL Oral Q4H PRN Charlcie Cradle, MD      . chlordiazePOXIDE (LIBRIUM) capsule 25 mg  25 mg Oral Q6H PRN Charlcie Cradle, MD      . chlordiazePOXIDE (LIBRIUM) capsule 25 mg  25 mg Oral QID Charlcie Cradle, MD   25 mg at 05/16/14 0759   Followed by  . [START ON 05/17/2014] chlordiazePOXIDE (LIBRIUM) capsule 25 mg  25 mg Oral TID Charlcie Cradle, MD       Followed by  . [START ON 05/18/2014] chlordiazePOXIDE (LIBRIUM) capsule 25 mg  25 mg Oral BH-qamhs Charlcie Cradle, MD       Followed by  . [START ON 05/20/2014] chlordiazePOXIDE (LIBRIUM) capsule 25 mg  25 mg Oral Daily Charlcie Cradle, MD      . hydrOXYzine (ATARAX/VISTARIL) tablet 25 mg  25 mg Oral Q6H PRN Charlcie Cradle, MD      . loperamide (IMODIUM) capsule 2-4 mg  2-4 mg Oral PRN Charlcie Cradle, MD      . magnesium hydroxide (MILK OF MAGNESIA) suspension 30 mL  30 mL Oral Daily PRN Charlcie Cradle, MD      . multivitamin with minerals tablet 1 tablet  1 tablet Oral Daily Charlcie Cradle, MD   1 tablet at 05/16/14 0759  . ondansetron (ZOFRAN-ODT) disintegrating tablet 4 mg  4 mg Oral Q6H PRN Charlcie Cradle, MD      . thiamine (B-1) injection 100 mg  100 mg Intramuscular  Once Charlcie Cradle, MD      . thiamine (VITAMIN B-1) tablet 100 mg  100 mg  Oral Daily Charlcie Cradle, MD   100 mg at 05/16/14 0759  . traZODone (DESYREL) tablet 50 mg  50 mg Oral QHS PRN Charlcie Cradle, MD        Observation Level/Precautions:  15 minute checks  Laboratory:  CBC Chemistry Profile UDS Alcohol dependence  Psychotherapy:  Encouraged to participate in group Psychotherapy  Medications:  See above  Consultations:  As needed  Discharge Concerns:  Relapse  Estimated LOS: 2-5 days.  Other:     I certify that inpatient services furnished can reasonably be expected to improve the patient's condition.   Charmaine Downs, C   PMHNP-BC 7/12/201510:02 AM

## 2014-05-16 NOTE — Progress Notes (Signed)
Patient ID: Kelsey BarryVictoria K Tisdale, female   DOB: 08/15/1971, 43 y.o.   MRN: [id number]   D: Pt has been very flat and depressed on the unit today. Pt did attend all groups and was able to engage in treatment. Pt did report concerns about not getting her Wellbutrin, Josiphine NP made aware. Pt reported that once she is discharged, she would like to go somewhere for long term treatment. Pt reported being negative SI/HI, no AH/VH noted. A: 15 min checks continued for patient safety. R: Pt safety maintained.

## 2014-05-16 NOTE — [facility] Group Notes (Signed)
[facility] Group Notes:  (Nursing/MHT/Case Management/Adjunct)  Date:  05/16/2014  Time:  3:58 PM  Type of Therapy:  Psychoeducational Skills  Participation Level:  Did Not Attend  Forrest, Shalita Shanta 05/16/2014, 3:58 PM 
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
## 2014-05-16 NOTE — Progress Notes (Signed)
Patient did attend the evening speaker AA meeting.  

## 2014-05-16 NOTE — Progress Notes (Signed)
[facility] Group Notes:  (Nursing/MHT/Case Management/Adjunct)  Date:  05/16/2014  Time:  6:33 PM  Type of Therapy:  Psychoeducational Skills  Participation Level:  Active  Participation Quality:  Appropriate and Attentive  Affect:  Appropriate  Cognitive:  Appropriate  Insight:  Appropriate  Engagement in Group:  Engaged  Modes of Intervention:  Activity  Summary of Progress/Problems: Pts played a game of Pictionary using coping skills.  [name], Kelsey C 05/16/2014, 6:33 PM 

## 2014-05-16 NOTE — [facility] Suicide Risk Assessment (Signed)
   Nursing information obtained from:   pt Demographic factors:   female, AA Current Mental Status:   denies SI/HI, stressful job Loss Factors:   n/a Historical Factors:   hx of 1 suicide attempt while on OCP at age of 43, reports family hx of substance abuse  Risk Reduction Factors:   lives with mother, positive social support Total Time spent with patient: 20 minutes  CLINICAL FACTORS:   Alcohol dependence Depression mild  Psychiatric Specialty Exam: Physical Exam  Review of Systems  Constitutional: Negative for malaise/fatigue.  Respiratory: Negative for cough.   Cardiovascular: Negative for chest pain.  Gastrointestinal: Positive for nausea. Negative for diarrhea.  Musculoskeletal: Negative for back pain and myalgias.  Neurological: Positive for dizziness. Negative for headaches.  Psychiatric/Behavioral: Positive for substance abuse. Negative for suicidal ideas and hallucinations. The patient is not nervous/anxious.     Blood pressure 119/90, pulse 97, temperature 98.5 F (36.9 C), temperature source Oral, resp. rate 16, height 5\' 7"  (1.702 m), weight 64.864 kg (143 lb), last menstrual period 05/05/2014.Body mass index is 22.39 kg/(m^2).  General Appearance: Neat  Eye Contact::  Good  Speech:  Clear and Coherent and Normal Rate  Volume:  Normal  Mood:  Euthymic  Affect:  Congruent  Thought Process:  Goal Directed  Orientation:  Full (Time, Place, and Person)  Thought Content:  WDL  Suicidal Thoughts:  No  Homicidal Thoughts:  No  Memory:  Immediate;   Fair Recent;   Fair Remote;   Fair  Judgement:  Good  Insight:  Good  Psychomotor Activity:  Normal  Concentration:  Good  Recall:  Good  Fund of Knowledge:Good  Language: Good  Akathisia:  No  Handed:  Right  AIMS (if indicated):     Assets:  Communication Skills Desire for Improvement Housing Social Support  Sleep:  Number of Hours: 6.75   Musculoskeletal: Strength & Muscle Tone: within normal limits Gait  & Station: normal Patient leans: N/A  COGNITIVE FEATURES THAT CONTRIBUTE TO RISK:  n/a  SUICIDE RISK:   Minimal: No identifiable suicidal ideation.  Patients presenting with no risk factors but with morbid ruminations; may be classified as minimal risk based on the severity of the depressive symptoms  PLAN OF CARE:  I certify that inpatient services furnished can reasonably be expected to improve the patient's condition.  Oletta DarterGARWAL, SALINA 05/16/2014, 8:43 AM

## 2014-05-16 NOTE — [facility] Group Notes (Signed)
[facility] Group Notes:  (Clinical Social Work)  05/16/2014  10:00-11:00AM  Summary of Progress/Problems:   The main focus of today's process group was to   identify the patient's current support system and decide on other supports that can be put in place.  The picture on workbook was used to discuss why additional supports are needed.  An emphasis was placed on using counselor, doctor, therapy groups, 12-step groups, and problem-specific support groups to expand supports.   There was also an extensive discussion about what constitutes a healthy support versus an unhealthy support.  The patient sat through group appearing to be interested, engaged, and spoke a little but not extensively.  She interacted well with the other patients.  Type of Therapy:  Process Group with Motivational Interviewing  Participation Level:  Active  Participation Quality:  Attentive and Sharing  Affect:  Appropriate  Cognitive:  Appropriate and Oriented  Insight:  Engaged  Engagement in Therapy:  Engaged  Modes of Intervention:   Education, Support and Processing, Activity  Pilgrim's PrideMareida Grossman-Orr, LCSW 05/16/2014, 12:15pm

## 2014-05-16 NOTE — H&P (Signed)
I agree with the assessment and plan.

## 2014-05-17 DIAGNOSIS — F411 Generalized anxiety disorder: Secondary | ICD-10-CM

## 2014-05-17 MED ORDER — FLUOXETINE HCL 10 MG PO CAPS
10.0000 mg | ORAL_CAPSULE | Freq: Every day | ORAL | Status: DC
  Administered 2014-05-17 – 2014-05-18 (×2): 10 mg via ORAL
  Filled 2014-05-17 (×4): qty 1
  Filled 2014-05-17: qty 3

## 2014-05-17 MED ORDER — BUPROPION HCL ER (XL) 150 MG PO TB24
150.0000 mg | ORAL_TABLET | Freq: Every day | ORAL | Status: DC
  Administered 2014-05-17 – 2014-05-18 (×2): 150 mg via ORAL
  Filled 2014-05-17 (×4): qty 1
  Filled 2014-05-17: qty 3

## 2014-05-17 MED ORDER — ACAMPROSATE CALCIUM 333 MG PO TBEC
666.0000 mg | DELAYED_RELEASE_TABLET | Freq: Three times a day (TID) | ORAL | Status: DC
  Administered 2014-05-17 – 2014-05-18 (×4): 666 mg via ORAL
  Filled 2014-05-17: qty 18
  Filled 2014-05-17: qty 2
  Filled 2014-05-17 (×2): qty 18
  Filled 2014-05-17 (×6): qty 2

## 2014-05-17 MED ORDER — BUPROPION HCL ER (XL) 150 MG PO TB24: 150.0000 mg | ORAL_TABLET | Freq: Every day | ORAL | Status: DC

## 2014-05-17 MED ORDER — BUPROPION HCL ER (XL) 300 MG PO TB24: 300.0000 mg | ORAL_TABLET | Freq: Every day | ORAL | Status: DC

## 2014-05-17 NOTE — [facility] Group Notes (Signed)
[facility]   05/17/2014 10:06 AM  Participation Quality:  Appropriate   Mood/Affect:  Appropriate  Depression Rating:  2  Anxiety Rating:  3  Thoughts of Suicide:  No Will you contract for safety?   NA  Current AVH:  No  Plan for Discharge/Comments:  Pt pleasant during group. She shared that she went on binge recently and has struggled with alcohol abuse throughout her life. 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 She needs hospital note for work. Pt plans to [facility]/IOP if possible-csw assessing.   Transportation Means: boyfriend   Supports: boyfriend/son/some family supports identified.   Smart, American FinancialHeather LCSWA

## 2014-05-17 NOTE — Progress Notes (Signed)
Patient ID: Kelsey [name], female   DOB: 03/19/1971, 43 y.o.   MRN: [id number] She has been up and about and  To groups interacting with peers.  Has refused to fill out her self  Inventory. She denies having any problems.

## 2014-05-17 NOTE — Progress Notes (Signed)
Patient ID: Kelsey [name], female   DOB: 10/05/1971, 43 y.o.   MRN: [id number] D    --- pt. Denies pain os dis-comfort at this time.   She is friendly and receptive to staff and interacts well with peers.   She has been spending time in the dayroom playing cards with other pts.  She remains calm and shows no sign of stress at this time.   R  ---  Support and safety cks and meds as ordered   R  ---  Pt. Remains calm and safe on unit

## 2014-05-17 NOTE — Progress Notes (Addendum)
D: Pt is bright in affect and pleasant in mood. She is currently denying any withdrawal symptoms. Pt voices familiarly with taking Campral. Pt is denying and side effects for her current medication. Pt reports readiness for discharge. Pt was happy that she got to see her husband for visitation today. Pt is currently denying any suicidal ideation. Pt is also negative for any HI/AVH. Pt actively participates within the milieu.  A: Continued support and availability as needed was extended to this pt. Staff continue to monitor pt with q4715min checks.  R: No adverse drug reactions noted. Pt receptive to treatment. Pt remains safe at this time.

## 2014-05-17 NOTE — [facility] Group Notes (Signed)
[facility] LCSW Group Therapy  05/17/2014 2:44 PM  Type of Therapy:  Group Therapy  Participation Level:  Active  Participation Quality:  Attentive  Affect:  Appropriate  Cognitive:  Alert and Oriented  Insight:  Engaged  Engagement in Therapy:  Engaged  Modes of Intervention:  Confrontation, Discussion, Education, Exploration, Problem-solving, Rapport Building, Socialization and Support  Summary of Progress/Problems: Today's Topic: Overcoming Obstacles. Pt identified obstacles faced currently and processed barriers involved in overcoming these obstacles. Pt identified steps necessary for overcoming these obstacles and explored motivation (internal and external) for facing these difficulties head on. Pt further identified one area of concern in their lives and chose a skill of focus pulled from their "toolbox." TurkeyVictoria was attentive and engaged throughout today's therapy group. She shared that 'living at home with my mom" as her biggest obstacle while in recovery. "She and I butt heads a lot but she is supportive of me being here." TurkeyVictoria continues to show progress in the group setting and improving insight AEB her ability to process how "taking my insurance exam, saving money from work, going to [facility] for IOP, and continue taking my medication" can help her avoid relapse and meet her goal of moving into her own place.    Smart, Heather LCSWA  05/17/2014, 2:44 PM

## 2014-05-17 NOTE — [facility] Suicide Risk Assessment (Signed)
[facility] INPATIENT: Family/Significant Other Suicide Prevention Education  Suicide Prevention Education:  Education Completed; No one has been identified by the patient as the family member/significant other with whom the patient will be residing, and identified as the person(s) who will aid the patient in the event of a mental health crisis (suicidal ideations/suicide attempt).   Pt did not c/o SI at admission, nor have they endorsed SI during their stay here. SPE not required. SPI pamphlet provided to pt and she was encouraged to share information with support network, ask questions, and talk about any concerns relating to SPE.  The Sherwin-WilliamsHeather Smart, LCSWA 05/17/2014 10:09 AM

## 2014-05-17 NOTE — Progress Notes (Signed)
Adult Psychoeducational Group Note  Date:  05/17/2014 Time:  9:56 PM  Group Topic/Focus:  Wrap-Up Group:   The focus of this group is to help patients review their daily goal of treatment and discuss progress on daily workbooks.  Participation Level:  Active  Participation Quality:  Appropriate  Affect:  Appropriate  Cognitive:  Appropriate  Insight: Appropriate  Engagement in Group:  Engaged  Modes of Intervention:  Support  Additional Comments:  Pt stated that positive thing that happened is that she gets to go home tomorrow and that she had a visitor. Advice to her peers is for them to not quit before the miracle and to remember that they never know whats off the radar   Ciceron, Ritchie 05/17/2014, 9:56 PM

## 2014-05-17 NOTE — Progress Notes (Signed)
[facility]  05/17/2014 4:14 PM Kelsey [name]  MRN:  [id number] Subjective:  States she is completely overwhelmed with all that is going on in her life. She has not been able to get her life together. She is drinking out of control. Admits she has tried to do control drinking before but it has not worked for her. She has noticed that she has the hardest time the week before her menstrual cycle. She craves alcohol and usually she starts drinking then and it escalates. She has been prescribed Wellbutrin as an antidepressant as well as Campral. None of these has affected the drinking pattern she has described. . She took Pristiq what she found ineffective. She would like help with psychotropics if possible.   Diagnosis:   DSM5: Schizophrenia Disorders:  none Obsessive-Compulsive Disorders:  none Trauma-Stressor Disorders:  none Substance/Addictive Disorders:  Alcohol Related Disorder - Severe (303.90) Depressive Disorders:  Major Depressive Disorder - Moderate (296.22) Total Time spent with patient: 30 minutes  Axis I: Anxiety Disorder NOS  ADL's:  Intact  Sleep: Fair  Appetite:  Fair  Suicidal Ideation:  Plan:  denies Intent:  denies Means:  denies Homicidal Ideation:  Plan:  denies Intent:  denies Means:  denies AEB (as evidenced by):  Psychiatric Specialty Exam: Physical Exam  Review of Systems  Constitutional: Positive for malaise/fatigue.  HENT: Negative.   Eyes: Negative.   Respiratory: Negative.   Cardiovascular: Negative.   Gastrointestinal: Negative.   Genitourinary: Negative.   Musculoskeletal: Negative.   Skin: Negative.   Neurological: Negative.   Endo/Heme/Allergies: Negative.   Psychiatric/Behavioral: Positive for depression and substance abuse. The patient is nervous/anxious.     Blood pressure 112/76, pulse 99, temperature 98 F (36.7 C), temperature source Oral, resp. rate 16, height 5\' 7"  (1.702 m), weight 64.864 kg (143 lb), last  menstrual period 05/05/2014.Body mass index is 22.39 kg/(m^2).  General Appearance: Fairly Groomed  Patent attorneyye Contact::  Fair  Speech:  Clear and Coherent  Volume:  Increased  Mood:  Anxious and worried  Affect:  anxious, worried  Thought Process:  Coherent and Goal Directed  Orientation:  Full (Time, Place, and Person)  Thought Content:  symptoms, worries concerns  Suicidal Thoughts:  No  Homicidal Thoughts:  No  Memory:  Immediate;   Fair Recent;   Fair Remote;   Fair  Judgement:  Fair  Insight:  Present  Psychomotor Activity:  Restlessness  Concentration:  Fair  Recall:  FiservFair  Fund of Knowledge:NA  Language: Fair  Akathisia:  No  Handed:    AIMS (if indicated):     Assets:  Desire for Improvement  Sleep:  Number of Hours: 5   Musculoskeletal: Strength & Muscle Tone: within normal limits Gait & Station: normal Patient leans: N/A  Current Medications: Current Facility-Administered Medications  Medication Dose Route Frequency Provider Last Rate Last Dose  . acamprosate (CAMPRAL) tablet 666 mg  666 mg Oral TID WC Rachael FeeIrving A Lugo, MD   666 mg at 05/17/14 1200  . acetaminophen (TYLENOL) tablet 650 mg  650 mg Oral Q6H PRN Oletta DarterSalina Agarwal, MD      . alum & mag hydroxide-simeth (MAALOX/MYLANTA) 200-200-20 MG/5ML suspension 30 mL  30 mL Oral Q4H PRN Oletta DarterSalina Agarwal, MD      . buPROPion (WELLBUTRIN XL) 24 hr tablet 150 mg  150 mg Oral Daily Rachael FeeIrving A Lugo, MD   150 mg at 05/17/14 1008  . chlordiazePOXIDE (LIBRIUM) capsule 25 mg  25 mg Oral Q6H  PRN Oletta Darter, MD      . chlordiazePOXIDE (LIBRIUM) capsule 25 mg  25 mg Oral TID Oletta Darter, MD   25 mg at 05/17/14 1201   Followed by  . [START ON 05/18/2014] chlordiazePOXIDE (LIBRIUM) capsule 25 mg  25 mg Oral BH-qamhs Oletta Darter, MD       Followed by  . [START ON 05/20/2014] chlordiazePOXIDE (LIBRIUM) capsule 25 mg  25 mg Oral Daily Oletta Darter, MD      . FLUoxetine (PROZAC) capsule 10 mg  10 mg Oral Daily Rachael Fee, MD   10 mg  at 05/17/14 1008  . hydrOXYzine (ATARAX/VISTARIL) tablet 25 mg  25 mg Oral Q6H PRN Oletta Darter, MD   25 mg at 05/16/14 2200  . loperamide (IMODIUM) capsule 2-4 mg  2-4 mg Oral PRN Oletta Darter, MD      . magnesium hydroxide (MILK OF MAGNESIA) suspension 30 mL  30 mL Oral Daily PRN Oletta Darter, MD      . multivitamin with minerals tablet 1 tablet  1 tablet Oral Daily Oletta Darter, MD   1 tablet at 05/17/14 [phone]  . ondansetron (ZOFRAN-ODT) disintegrating tablet 4 mg  4 mg Oral Q6H PRN Oletta Darter, MD      . thiamine (B-1) injection 100 mg  100 mg Intramuscular Once Oletta Darter, MD      . thiamine (VITAMIN B-1) tablet 100 mg  100 mg Oral Daily Oletta Darter, MD   100 mg at 05/17/14 0837  . traZODone (DESYREL) tablet 50 mg  50 mg Oral QHS PRN Oletta Darter, MD        Lab Results: No results found for this or any previous visit (from the past 48 hour(s)).  Physical Findings: AIMS: Facial and Oral Movements Muscles of Facial Expression: None, normal Lips and Perioral Area: None, normal Jaw: None, normal Tongue: None, normal,Extremity Movements Upper (arms, wrists, hands, fingers): None, normal Lower (legs, knees, ankles, toes): None, normal, Trunk Movements Neck, shoulders, hips: None, normal, Overall Severity Severity of abnormal movements (highest score from questions above): None, normal Incapacitation due to abnormal movements: None, normal Patient's awareness of abnormal movements (rate only patient's report): No Awareness, Dental Status Current problems with teeth and/or dentures?: No Does patient usually wear dentures?: No  CIWA:  CIWA-Ar Total: 1 COWS:  COWS Total Score: 1  Treatment Plan Summary: Daily contact with patient to assess and evaluate symptoms and progress in treatment Medication management  Plan: Supportive approach/coping skills/relapse prevention           Will continue Wellbutrin XL 150 mg and add Prozac 10 mg as a way to address the                      symptoms during her menstrual cycle.           Will resume the Campral 333 mg two TID  Medical Decision Making Problem Points:  Review of psycho-social stressors (1) Data Points:  Review of medication regiment & side effects (2) Review of new medications or change in dosage (2)  I certify that inpatient services furnished can reasonably be expected to improve the patient's condition.   LUGO,IRVING A 05/17/2014, 4:14 PM

## 2014-05-18 DIAGNOSIS — F411 Generalized anxiety disorder: Secondary | ICD-10-CM | POA: Diagnosis present

## 2014-05-18 DIAGNOSIS — F1020 Alcohol dependence, uncomplicated: Principal | ICD-10-CM

## 2014-05-18 DIAGNOSIS — F1994 Other psychoactive substance use, unspecified with psychoactive substance-induced mood disorder: Secondary | ICD-10-CM

## 2014-05-18 MED ORDER — BUPROPION HCL ER (XL) 150 MG PO TB24: 150.0000 mg | ORAL_TABLET | Freq: Every day | ORAL | Status: DC

## 2014-05-18 MED ORDER — FERROUS SULFATE 325 (65 FE) MG PO TABS: 325.0000 mg | ORAL_TABLET | Freq: Every day | ORAL | Status: AC

## 2014-05-18 MED ORDER — ACAMPROSATE CALCIUM 333 MG PO TBEC: 666.0000 mg | DELAYED_RELEASE_TABLET | Freq: Three times a day (TID) | ORAL | Status: DC

## 2014-05-18 MED ORDER — ADULT MULTIVITAMIN W/MINERALS CH: 1.0000 | ORAL_TABLET | Freq: Every day | ORAL | Status: DC

## 2014-05-18 MED ORDER — FLUOXETINE HCL 10 MG PO CAPS: 10.0000 mg | ORAL_CAPSULE | Freq: Every day | ORAL | Status: DC

## 2014-05-18 MED ORDER — ASPIRIN EC 81 MG PO TBEC
81.0000 mg | DELAYED_RELEASE_TABLET | Freq: Every day | ORAL | Status: AC
Start: 2014-05-18 — End: ?

## 2014-05-18 MED ORDER — TRAZODONE HCL 50 MG PO TABS: 50.0000 mg | ORAL_TABLET | Freq: Every evening | ORAL | Status: DC | PRN

## 2014-05-18 NOTE — Discharge Summary (Signed)
Physician Discharge Summary Note  Patient:  Kelsey [name] is an 43 y.o., female MRN:  [id number] DOB:  12/03/1970 Patient phone:  [phone] (home)  Patient address:   [address] [place] [address],  Total Time spent with patient: Greater than 30 minutes  Date of Admission:  05/15/2014 Date of Discharge: 05/18/14  Reason for Admission: Alcohol detox  Discharge Diagnoses: Active Problems:   Alcohol dependence   Psychiatric Specialty Exam: Physical Exam  Psychiatric: Her speech is normal and behavior is normal. Judgment and thought content normal. Her mood appears not anxious. Her affect is not angry, not blunt, not labile and not inappropriate. Cognition and memory are normal. She does not exhibit a depressed mood.    Review of Systems  Constitutional: Negative.   HENT: Negative.   Eyes: Negative.   Respiratory: Negative.   Cardiovascular: Negative.   Gastrointestinal: Negative.   Genitourinary: Negative.   Musculoskeletal: Negative.   Skin: Negative.   Neurological: Negative.   Endo/Heme/Allergies: Negative.   Psychiatric/Behavioral: Positive for depression (Stable upon discharge) and substance abuse (Hx alcoholism, chronic). Negative for suicidal ideas, hallucinations and memory loss. The patient has insomnia (Stable upon discharge). The patient is not nervous/anxious.     Blood pressure 112/77, pulse 115, temperature 98.1 F (36.7 C), temperature source Oral, resp. rate 16, height _0  (1.702 m), weight 64.864 kg (143 lb), last menstrual period 05/05/2014.Body mass index is 22.39 kg/(m^2).   General Appearance: Fairly Groomed   Engineer, water:: Fair   Speech: Clear and Coherent   Volume: Normal   Mood: Euthymic   Affect: Appropriate   Thought Process: Coherent and Goal Directed   Orientation: Full (Time, Place, and Person)   Thought Content: plans as she moves on, relapse prevention plan   Suicidal Thoughts: No   Homicidal Thoughts: No   Memory:  Immediate; Fair  Recent; Fair  Remote; Fair   Judgement: Fair   Insight: Present   Psychomotor Activity: Normal   Concentration: Fair   Recall: [employer] of Knowledge:NA   Language: Fair   Akathisia: No   Handed:   AIMS (if indicated):   Assets: Desire for Improvement  Housing  Vocational/Educational   Sleep: Number of Hours: 6.25    Past Psychiatric History: Diagnosis: Alcohol dependence  Hospitalizations: [place], [facility] adult unit  Outpatient Care:  [facility]  Substance Abuse Care: [facility]  Self-Mutilation: NA  Suicidal Attempts: NA  Violent Behaviors: NA   Musculoskeletal: Strength & Muscle Tone: within normal limits Gait & Station: normal Patient leans: N/A  DSM5: Schizophrenia Disorders:  NA Obsessive-Compulsive Disorders:  NA Trauma-Stressor Disorders:  NA Substance/Addictive Disorders:  Alcohol Related Disorder - Severe (303.90) Depressive Disorders:  Substance induced mood diorder  Axis Diagnosis:   AXIS I:  Alcohol dependence, Substance induced mood disorder AXIS II:  Deferred AXIS III:   Past Medical History  Diagnosis Date  . Alcohol abuse   . Depression   . Suicide attempt    AXIS IV:  other psychosocial or environmental problems and Alcoholism, chronic AXIS V:  63  Level of Care:  OP  Hospital Course: This is a 43 years old female who was admitted from [facility] last night for alcohol detoxification. Patient went to the ER yesterday with the c/o feeling light headed and dizzy. Patient also requested detox from Alcohol. Patient reported binge drinking all day Wednesday, Thursday and early morning of Friday before going to work. Patient states she has had problems with  alcoholism for a long time and he had a handful of detox treatment. This is her first detox treatment here in our [facility] and she was last detoxed at [facility] 18 months ago.   Kelsey [name] was admitted to hospital because of alcohol intoxication. She  was needing alcohol detoxification treatment. Apparently, Kelsey [name] has alcohol problems and had been through series of detox treatments in the past. And while a patient in this hospital, She received Librium detox protocols. And besides the detox treatments, she was also medicated and discharged on Wellbutrin XL 150 mg daily for depression, Fluoxetine 10 mg daily depression, Campral 666 mg three times daily for alcohol dependence and Trazodone 50 mg Q bedtime for sleep.   Kelsey [name] also enrolled in the group counseling sessions and AA/NA meetings being offered on this unit. She learned coping skills. She was resumed on all her pertinent home medications for her other pre-existing medical ailments. She tolerated her treatment regimen without any adverse effects reported.  Kelsey [name] has completed detox treatment and her mood is stable. This is evidenced by her reports of improved mood and absence of withdrawal symptoms. She is currently being discharged to continue substance abuse treatment on an outpatient at the [facility] here in [place], [place]. She is provided with all the necessary information required to make this appointment without problems. Upon discharge, she adamantly denies any SIHI, AVH, delusions, paranoia and or withdrawal symptoms. She received from the [facility], a 14 days worth, supply samples of her [facility] discharge medications. She left [facility] with all personal belongings in no distress. Transportation per mother.  Consults:  psychiatry  Significant Diagnostic Studies:  labs:  CBC with diff, CMP, UDS, toxicology tests, U/A  Discharge Vitals:   Blood pressure 112/77, pulse 115, temperature 98.1 F (36.7 C), temperature source Oral, resp. rate 16, height _0  (1.702 m), weight 64.864 kg (143 lb), last menstrual period 05/05/2014. Body mass index is 22.39 kg/(m^2). Lab Results:   Results for orders placed during the hospital encounter of 05/15/14 (from the past 72 hour(s))  CBC WITH  DIFFERENTIAL     Status: Abnormal   Collection Time    05/15/14 10:56 AM      Result Value Ref Range   WBC 13.7 (*) 4.0 - 10.5 K/uL   RBC 3.74 (*) 3.87 - 5.11 MIL/uL   Hemoglobin 12.4  12.0 - 15.0 g/dL   HCT 36.3  36.0 - 46.0 %   MCV 97.1  78.0 - 100.0 fL   MCH 33.2  26.0 - 34.0 pg   MCHC 34.2  30.0 - 36.0 g/dL   RDW 14.1  11.5 - 15.5 %   Platelets 205  150 - 400 K/uL   Neutrophils Relative % 91 (*) 43 - 77 %   Neutro Abs 12.5 (*) 1.7 - 7.7 K/uL   Lymphocytes Relative 4 (*) 12 - 46 %   Lymphs Abs 0.5 (*) 0.7 - 4.0 K/uL   Monocytes Relative 5  3 - 12 %   Monocytes Absolute 0.7  0.1 - 1.0 K/uL   Eosinophils Relative 0  0 - 5 %   Eosinophils Absolute 0.0  0.0 - 0.7 K/uL   Basophils Relative 0  0 - 1 %   Basophils Absolute 0.0  0.0 - 0.1 K/uL  COMPREHENSIVE METABOLIC PANEL     Status: Abnormal   Collection Time    05/15/14 10:56 AM      Result Value Ref Range   Sodium  139  137 - 147 mEq/L   Potassium 3.5 (*) 3.7 - 5.3 mEq/L   Chloride 94 (*) 96 - 112 mEq/L   CO2 19  19 - 32 mEq/L   Glucose, Bld 85  70 - 99 mg/dL   BUN 17  6 - 23 mg/dL   Creatinine, Ser 0.77  0.50 - 1.10 mg/dL   Calcium 9.5  8.4 - 10.5 mg/dL   Total Protein 8.1  6.0 - 8.3 g/dL   Albumin 4.6  3.5 - 5.2 g/dL   AST 34  0 - 37 U/L   ALT 19  0 - 35 U/L   Alkaline Phosphatase 41  39 - 117 U/L   Total Bilirubin 0.9  0.3 - 1.2 mg/dL   GFR calc non Af Amer >90  >90 mL/min   GFR calc Af Amer >90  >90 mL/min   Comment: (NOTE)     The eGFR has been calculated using the CKD EPI equation.     This calculation has not been validated in all clinical situations.     eGFR's persistently <90 mL/min signify possible Chronic Kidney     Disease.   Anion gap 26 (*) 5 - 15   Comment: RESULT REPEATED AND VERIFIED  URINE RAPID DRUG SCREEN (HOSP PERFORMED)     Status: None   Collection Time    05/15/14 11:55 AM      Result Value Ref Range   Opiates NONE DETECTED  NONE DETECTED   Cocaine NONE DETECTED  NONE DETECTED    Benzodiazepines NONE DETECTED  NONE DETECTED   Amphetamines NONE DETECTED  NONE DETECTED   Tetrahydrocannabinol NONE DETECTED  NONE DETECTED   Barbiturates NONE DETECTED  NONE DETECTED   Comment:            DRUG SCREEN FOR MEDICAL PURPOSES     ONLY.  IF CONFIRMATION IS NEEDED     FOR ANY PURPOSE, NOTIFY LAB     WITHIN 5 DAYS.                LOWEST DETECTABLE LIMITS     FOR URINE DRUG SCREEN     Drug Class       Cutoff (ng/mL)     Amphetamine      1000     Barbiturate      200     Benzodiazepine   536     Tricyclics       644     Opiates          300     Cocaine          300     THC              50  ETHANOL     Status: None   Collection Time    05/15/14  1:12 PM      Result Value Ref Range   Alcohol, Ethyl (B) <11  0 - 11 mg/dL   Comment:            LOWEST DETECTABLE LIMIT FOR     SERUM ALCOHOL IS 11 mg/dL     FOR MEDICAL PURPOSES ONLY  TROPONIN I     Status: None   Collection Time    05/15/14  1:12 PM      Result Value Ref Range   Troponin I <0.30  <0.30 ng/mL   Comment:            Due to the release  kinetics of cTnI,     a negative result within the first hours     of the onset of symptoms does not rule out     myocardial infarction with certainty.     If myocardial infarction is still suspected,     repeat the test at appropriate intervals.    Physical Findings: AIMS: Facial and Oral Movements Muscles of Facial Expression: None, normal Lips and Perioral Area: None, normal Jaw: None, normal Tongue: None, normal,Extremity Movements Upper (arms, wrists, hands, fingers): None, normal Lower (legs, knees, ankles, toes): None, normal, Trunk Movements Neck, shoulders, hips: None, normal, Overall Severity Severity of abnormal movements (highest score from questions above): None, normal Incapacitation due to abnormal movements: None, normal Patient's awareness of abnormal movements (rate only patient's report): No Awareness, Dental Status Current problems with teeth  and/or dentures?: No Does patient usually wear dentures?: No  CIWA:  CIWA-Ar Total: 0 COWS:  COWS Total Score: 1  Psychiatric Specialty Exam: See Psychiatric Specialty Exam and Suicide Risk Assessment completed by Attending Physician prior to discharge.  Discharge destination:  Home  Is patient on multiple antipsychotic therapies at discharge:  No   Has Patient had three or more failed trials of antipsychotic monotherapy by history:  No  Recommended Plan for Multiple Antipsychotic Therapies: NA     Medication List    STOP taking these medications       caffeine 200 MG Tabs tablet     ePHEDrine 25 MG capsule     ibuprofen 800 MG tablet  Commonly known as:  ADVIL,MOTRIN      TAKE these medications     Indication   acamprosate 333 MG tablet  Commonly known as:  CAMPRAL  Take 2 tablets (666 mg total) by mouth 3 (three) times daily with meals. For alcoholism   Indication:  Excessive Use of Alcohol     aspirin EC 81 MG tablet  Take 1 tablet (81 mg total) by mouth daily. For heart health   Indication:  Heart health     buPROPion 150 MG 24 hr tablet  Commonly known as:  WELLBUTRIN XL  Take 1 tablet (150 mg total) by mouth daily. For depression   Indication:  Major Depressive Disorder     ferrous sulfate 325 (65 FE) MG tablet  Take 1 tablet (325 mg total) by mouth daily with breakfast. For low iron   Indication:  Iron Deficiency     FLUoxetine 10 MG capsule  Commonly known as:  PROZAC  Take 1 capsule (10 mg total) by mouth daily. For depression   Indication:  Major Depressive Disorder     multivitamin with minerals Tabs tablet  Take 1 tablet by mouth daily. For low vitamin   Indication:  Low vitamin     traZODone 50 MG tablet  Commonly known as:  DESYREL  Take 1 tablet (50 mg total) by mouth at bedtime as needed for sleep.   Indication:  Trouble Sleeping       Follow-up Information   Follow up with [facility] On 05/19/2014. (Appt. with Mr. [facility] at 1:00PM  for assessment (med management/SA IOP))    Contact information:   [address] [place], [place] [address] Phone: [phone] Fax: [phone]     Follow-up recommendations:  Activity:  As tolerated Diet: As recommended by your primary care doctor. Keep all scheduled follow-up appointments as recommended.  Comments: Take all your medications as prescribed by your mental healthcare provider. Report any adverse effects and  or reactions from your medicines to your outpatient provider promptly. Patient is instructed and cautioned to not engage in alcohol and or illegal drug use while on prescription medicines. In the event of worsening symptoms, patient is instructed to call the crisis hotline, 911 and or go to the nearest ED for appropriate evaluation and treatment of symptoms. Follow-up with your primary care provider for your other medical issues, concerns and or health care needs.  Total Discharge Time:  Greater than 30 minutes.  Signed: Encarnacion Slates, Rice, FNP 05/18/2014, 9:38 AM I personally assessed the patient and formulated the plan Geralyn Flash A. Sabra Heck, M.D.

## 2014-05-18 NOTE — Tx Team (Signed)
Interdisciplinary Treatment Plan Update (Adult)  Date: 05/18/2014   Time Reviewed: 10:52 AM  Progress in Treatment:  Attending groups: Yes  Participating in groups:  Yes  Taking medication as prescribed: Yes  Tolerating medication: Yes  Family/Significant othe contact made: No. SPE not required for this pt.   Patient understands diagnosis: Yes, AEB seeking treatment for ETOH detox, mood stabilization, and med management.  Discussing patient identified problems/goals with staff: Yes  Medical problems stabilized or resolved: Yes  Denies suicidal/homicidal ideation: Yes during admission, group, and self report.  Patient has not harmed self or Others: Yes  New problem(s) identified:  Discharge Plan or Barriers: Pt plans to return home and has follow-up assessment for med management/SA IOP/therapy with [facility] at the [facility]. Additional comments:This is a 43 years old female who was [facility] for alcohol detoxification. Patient went to the ER yesterday with the c/o feeling light headed and dizzy. Patient also requested detox from Alcohol. Patient reported binge drinking all day Wednesday, Thursday and early morning of Friday before going to work. Patient states she has had problems with alcoholism for a long time and he had a handful of detox treatment. [facility] and she was [facility] detox[facility] ago. Patient reports she experimented with Alcohol at age 89 but started drinking regularly from age 43. She mask her drinking by not drinking at work at [employer]. Patient has a son 42 years old who lives with his father. She has support of her family [place] in [place]. She denies feeling depressed but has anxiety issues. She also stated that her anxiety disorder has improved since starting Wellbutrin. Patient reports poor sleep and fair appetite. Patient has two jobs that keeps her busy but she comes home to  drink. Patient reports she can drink one to two bottles of wine a day. She denies SI/HI /AVH. She has a remote hx of suicide attempt by cutting her wrist when she was having a bed reaction to Depo prevera birth control implant. As soon as she was taken off the implant she felt better. She is admitted to our Chemical dependency unit For alcohol detox. We are using our Librium protocol for her detox. Patient has shown interest in outpatient intensive treatment on discharge. Reason for Continuation of Hospitalization: d/c today Estimated length of stay: d/c today For review of initial/current patient goals, please see plan of care.  Attendees:  Patient:    Family:    Physician: Geoffery LyonsIrving Lugo MD 05/18/2014 9:52AM  Nursing: Lupita LeashDonna RN 05/18/2014 9:52AM  Clinical Social Worker Heather Smart, LCSWA  05/18/2014 9:52AM  Other: Meryl DareJennifer P. RN 05/18/2014 9:52AM  Other: Chandra BatchAggie N. PA 05/18/2014 9:52AM  Other: Darden DatesJennifer C. Nurse CM  05/18/2014 9:52AM  Other:    Scribe for Treatment Team:  Trula SladeHeather Smart LCSWA 05/18/2014 9:52AM

## 2014-05-18 NOTE — Progress Notes (Signed)
Patient ID: Kelsey [name], female   DOB: 03/13/1971, 43 y.o.   MRN: [id number] She has been discharged home and was picked up by her mother. She voiced understanding of discharge instruction and of the follow up plan. She denies SI thoughts and all her belongings were taken with her. She stated she was going back to work and then take a test to [employer] insurance.

## 2014-05-18 NOTE — [facility] Group Notes (Signed)
[facility] LCSW Group Therapy  05/18/2014 2:57 PM  Type of Therapy:  Group Therapy  Participation Level:  Active  Participation Quality:  Attentive  Affect:  Appropriate  Cognitive:  Alert and Oriented  Insight:  Engaged  Engagement in Therapy:  Engaged  Modes of Intervention:  Confrontation, Discussion, Education, Exploration, Problem-solving, Rapport Building, Socialization and Support  Summary of Progress/Problems: MHA Speaker came to talk about his personal journey with substance abuse and addiction. The pt processed ways by which to relate to the speaker. MHA speaker provided handouts and educational information pertaining to groups and services offered by the [facility].    Smart, Heather LCSWA  05/18/2014, 2:57 PM

## 2014-05-18 NOTE — [facility] Suicide Risk Assessment (Signed)
Suicide Risk Assessment  Discharge Assessment     Demographic Factors:  NA  Total Time spent with patient: 45 minutes  Psychiatric Specialty Exam:     Blood pressure 112/77, pulse 115, temperature 98.1 F (36.7 C), temperature source Oral, resp. rate 16, height 5\' 7"  (1.702 m), weight 64.864 kg (143 lb), last menstrual period 05/05/2014.Body mass index is 22.39 kg/(m^2).  General Appearance: Fairly Groomed  Patent attorneyye Contact::  Fair  Speech:  Clear and Coherent  Volume:  Normal  Mood:  Euthymic  Affect:  Appropriate  Thought Process:  Coherent and Goal Directed  Orientation:  Full (Time, Place, and Person)  Thought Content:  plans as she moves on, relapse prevention plan  Suicidal Thoughts:  No  Homicidal Thoughts:  No  Memory:  Immediate;   Fair Recent;   Fair Remote;   Fair  Judgement:  Fair  Insight:  Present  Psychomotor Activity:  Normal  Concentration:  Fair  Recall:  [employer]  Fund of Knowledge:NA  Language: Fair  Akathisia:  No  Handed:    AIMS (if indicated):     Assets:  Desire for Improvement Housing Vocational/Educational  Sleep:  Number of Hours: 6.25    Musculoskeletal: Strength & Muscle Tone: within normal limits Gait & Station: normal Patient leans: N/A   Mental Status Per Nursing Assessment::   On Admission:     Current Mental Status by Physician: In full contact with reality. There are no active S/S of withdrawal. She is willing and motivated to pursue outpatient treatment.    Loss Factors: NA  Historical Factors: NA  Risk Reduction Factors:   Sense of responsibility to family, Employed and Positive social support  Continued Clinical Symptoms:  Depression:   Comorbid alcohol abuse/dependence Alcohol/Substance Abuse/Dependencies  Cognitive Features That Contribute To Risk:  Polarized thinking Thought constriction (tunnel vision)    Suicide Risk:  Minimal: No identifiable suicidal ideation.  Patients presenting with no risk factors but  with morbid ruminations; may be classified as minimal risk based on the severity of the depressive symptoms  Discharge Diagnoses:   AXIS I:  Alcohol Dependence, S/P alcohol withdrawal, Generalized Anxiety Disorder, Substance Induced Mood Disorder AXIS II:  No diagnosis AXIS III:   Past Medical History  Diagnosis Date  . Alcohol abuse   . Depression   . Suicide attempt    AXIS IV:  other psychosocial or environmental problems AXIS V:  61-70 mild symptoms  Plan Of Care/Follow-up recommendations:  Activity:  as tolerated Diet:  regular Follow up [facility] Is patient on multiple antipsychotic therapies at discharge:  No   Has Patient had three or more failed trials of antipsychotic monotherapy by history:  No  Recommended Plan for Multiple Antipsychotic Therapies: NA    LUGO,IRVING A 05/18/2014, 12:31 PM

## 2014-05-18 NOTE — Progress Notes (Signed)
Patient ID: Kelsey [name], female   DOB: 06/07/1971, 43 y.o.   MRN: [id number] She has been up and about interacting with peers and staff more today. Affect is brighter. Self inventory: depression and hopeless at 4. Denies withdrawal symptoms and SI thoughts. No c/o discomfort.

## 2014-05-18 NOTE — Progress Notes (Signed)
[facility] Discharge Plan :  Will you be returning to the same living situation after discharge: Yes,  home At discharge, do you have transportation home?:Yes,  mother/boyfriend Do you have the ability to pay for your medications:Yes,  [place]'  Release of information consent forms completed and submitted to Medical Records by CSW.  Patient to Follow up at: Follow-up Information   [facility] up [facility] On 05/19/2014. (Appt. with [facility]. [facility] at 1:00PM for assessment (med management/SA IOP))    Contact information:   [address] [place], [place] [address] Phone: [phone] Fax: [phone]      Patient denies SI/HI:   Yes, during admission, group, and self report.  Safety Planning and Suicide Prevention discussed:  Yes,  SPE not required as pt did not endorse SI during admission or during stay [facility]. Pt was provided with SPI pamphlet and encouraged to share information with support network, ask questions, and talk about any concerns relating to SPE.  Smart, Heather LCSWA 05/18/2014, 9:03 AM

## 2014-05-18 NOTE — [facility] Group Notes (Signed)
[facility] Group Notes:  (Nursing/MHT/Case Management/Adjunct)  Date:  05/18/2014  Time:    Type of Therapy:  Psychoeducational Skills  Participation Level:  Active  Participation Quality:  Appropriate  Affect:  Appropriate  Cognitive:  Alert and Appropriate  Insight:  Appropriate  Engagement in Group:  Engaged and Supportive  Modes of Intervention:  Clarification and Support  Summary of Progress/Problems:Morning wellness group; Sleep Hygiene    Manuela Schwartzritchett, Jennifer [facility] 05/18/2014, 12:44 PM

## 2014-05-19 NOTE — ED Provider Notes (Signed)
Medical screening examination/treatment/procedure(s) were conducted as a shared visit with non-physician practitioner(s) and myself.  I personally evaluated the patient during the encounter.   EKG Interpretation   Date/Time:  Saturday May 15 2014 11:03:01 EDT Ventricular Rate:  94 PR Interval:  143 QRS Duration: 92 QT Interval:  379 QTC Calculation: 474 R Axis:   64 Text Interpretation:  Sinus rhythm Nonspecific T wave abnormality No  previous tracing Confirmed by STEINL  MD, Caryn BeeKEVIN ([id number]) on 05/15/2014  11:09:35 AM      Pt states hx heavy etoh use/abuse, requests rehab/detox eval.   No hx complicated etoh withdrawal, seizure, or dts.  Alert, no tremor or shakes.   Suzi RootsKevin E Steinl, MD 05/19/14 [phone]

## 2014-05-21 NOTE — Progress Notes (Signed)
Patient Discharge Instructions:  After Visit Summary (AVS):   Faxed to:  05/21/14 Discharge Summary Note:   Faxed to:  05/21/14 Psychiatric Admission Assessment Note:   Faxed to:  05/21/14 Suicide Risk Assessment - Discharge Assessment:   Faxed to:  05/21/14 Faxed/Sent to the Next Level Care provider:  05/21/14 Faxed to [facility] @ [phone]  Kelsey ReddenSheena [name] [name], 05/21/2014, 3:14 PM

## 2014-10-08 ENCOUNTER — Encounter (HOSPITAL_COMMUNITY): Payer: Self-pay | Admitting: Emergency Medicine

## 2014-10-08 ENCOUNTER — Emergency Department (HOSPITAL_COMMUNITY)
Admission: EM | Admit: 2014-10-08 | Discharge: 2014-10-08 | Disposition: A | Discharge status: Home / Self Care | Payer: Medicaid Other | Attending: Emergency Medicine | Admitting: Emergency Medicine

## 2014-10-08 DIAGNOSIS — F329 Major depressive disorder, single episode, unspecified: Secondary | ICD-10-CM | POA: Insufficient documentation

## 2014-10-08 DIAGNOSIS — Z7982 Long term (current) use of aspirin: Secondary | ICD-10-CM | POA: Insufficient documentation

## 2014-10-08 DIAGNOSIS — Y9389 Activity, other specified: Secondary | ICD-10-CM | POA: Insufficient documentation

## 2014-10-08 DIAGNOSIS — Z79899 Other long term (current) drug therapy: Secondary | ICD-10-CM | POA: Insufficient documentation

## 2014-10-08 DIAGNOSIS — S61412A Laceration without foreign body of left hand, initial encounter: Secondary | ICD-10-CM

## 2014-10-08 DIAGNOSIS — Y998 Other external cause status: Secondary | ICD-10-CM | POA: Diagnosis not present

## 2014-10-08 DIAGNOSIS — W25XXXA Contact with sharp glass, initial encounter: Secondary | ICD-10-CM | POA: Insufficient documentation

## 2014-10-08 DIAGNOSIS — Y9289 Other specified places as the place of occurrence of the external cause: Secondary | ICD-10-CM | POA: Insufficient documentation

## 2014-10-08 MED ORDER — LIDOCAINE-EPINEPHRINE 2 %-1:100000 IJ SOLN
10.0000 mL | Freq: Once | INTRAMUSCULAR | Status: AC
  Administered 2014-10-08: 10 mL via INTRADERMAL

## 2014-10-08 MED ORDER — OXYCODONE-ACETAMINOPHEN 5-325 MG PO TABS
1.0000 | ORAL_TABLET | Freq: Once | ORAL | Status: AC
  Administered 2014-10-08: 1 via ORAL

## 2014-10-08 MED ORDER — HYDROCODONE-ACETAMINOPHEN 5-325 MG PO TABS: 1.0000 | ORAL_TABLET | Freq: Four times a day (QID) | ORAL | Status: DC | PRN

## 2014-10-08 NOTE — Discharge Instructions (Signed)
Follow instructions below for care of the laceration. Follow-up at urgent care in one week to 10 days for suture removal. If you notice loss of sensation to your hand or having difficulty with range of motion then please follow-up with hand specialist for further evaluation. Return sooner if notice any signs of infection. Take pain medication as needed but avoid driving, operating heavy machinery, or consume with alcohol.   Laceration Care, Adult A laceration is a cut or lesion that goes through all layers of the skin and into the tissue just beneath the skin. TREATMENT  Some lacerations may not require closure. Some lacerations may not be able to be closed due to an increased risk of infection. It is important to see your caregiver as soon as possible after an injury to minimize the risk of infection and maximize the opportunity for successful closure. If closure is appropriate, pain medicines may be given, if needed. The wound will be cleaned to help prevent infection. Your caregiver will use stitches (sutures), staples, wound glue (adhesive), or skin adhesive strips to repair the laceration. These tools bring the skin edges together to allow for faster healing and a better cosmetic outcome. However, all wounds will heal with a scar. Once the wound has healed, scarring can be minimized by covering the wound with sunscreen during the day for 1 full year. HOME CARE INSTRUCTIONS  For sutures or staples:  Keep the wound clean and dry.  If you were given a bandage (dressing), you should change it at least once a day. Also, change the dressing if it becomes wet or dirty, or as directed by your caregiver.  Wash the wound with soap and water 2 times a day. Rinse the wound off with water to remove all soap. Pat the wound dry with a clean towel.  After cleaning, apply a thin layer of the antibiotic ointment as recommended by your caregiver. This will help prevent infection and keep the dressing from  sticking.  You may shower as usual after the first 24 hours. Do not soak the wound in water until the sutures are removed.  Only take over-the-counter or prescription medicines for pain, discomfort, or fever as directed by your caregiver.  Get your sutures or staples removed as directed by your caregiver. For skin adhesive strips:  Keep the wound clean and dry.  Do not get the skin adhesive strips wet. You may bathe carefully, using caution to keep the wound dry.  If the wound gets wet, pat it dry with a clean towel.  Skin adhesive strips will fall off on their own. You may trim the strips as the wound heals. Do not remove skin adhesive strips that are still stuck to the wound. They will fall off in time. For wound adhesive:  You may briefly wet your wound in the shower or bath. Do not soak or scrub the wound. Do not swim. Avoid periods of heavy perspiration until the skin adhesive has fallen off on its own. After showering or bathing, gently pat the wound dry with a clean towel.  Do not apply liquid medicine, cream medicine, or ointment medicine to your wound while the skin adhesive is in place. This may loosen the film before your wound is healed.  If a dressing is placed over the wound, be careful not to apply tape directly over the skin adhesive. This may cause the adhesive to be pulled off before the wound is healed.  Avoid prolonged exposure to sunlight or tanning lamps  while the skin adhesive is in place. Exposure to ultraviolet light in the first year will darken the scar.  The skin adhesive will usually remain in place for 5 to 10 days, then naturally fall off the skin. Do not pick at the adhesive film. You may need a tetanus shot if:  You cannot remember when you had your last tetanus shot.  You have never had a tetanus shot. If you get a tetanus shot, your arm may swell, get red, and feel warm to the touch. This is common and not a problem. If you need a tetanus shot and  you choose not to have one, there is a rare chance of getting tetanus. Sickness from tetanus can be serious. SEEK MEDICAL CARE IF:   You have redness, swelling, or increasing pain in the wound.  You see a red line that goes away from the wound.  You have yellowish-white fluid (pus) coming from the wound.  You have a fever.  You notice a bad smell coming from the wound or dressing.  Your wound breaks open before or after sutures have been removed.  You notice something coming out of the wound such as wood or glass.  Your wound is on your hand or foot and you cannot move a finger or toe. SEEK IMMEDIATE MEDICAL CARE IF:   Your pain is not controlled with prescribed medicine.  You have severe swelling around the wound causing pain and numbness or a change in color in your arm, hand, leg, or foot.  Your wound splits open and starts bleeding.  You have worsening numbness, weakness, or loss of function of any joint around or beyond the wound.  You develop painful lumps near the wound or on the skin anywhere on your body. MAKE SURE YOU:   Understand these instructions.  Will watch your condition.  Will get help right away if you are not doing well or get worse. Document Released: 10/22/2005 Document Revised: 01/14/2012 Document Reviewed: 04/17/2011 [employer] [facility] [employer] Patient Information 2015 [employer] [place]. This information is not intended to replace advice given to you by your health care provider. Make sure you discuss any questions you have with your health care provider.

## 2014-10-08 NOTE — ED Notes (Signed)
Pt had bottle of wine in her hand when she was trying to open the door the glass broke and pt has approx 3-4 inch laceration on left hand. Pt states there is still glass in her hand. Pt has already been drinking ETOH this am.

## 2014-10-08 NOTE — ED Provider Notes (Signed)
CSN: [id number]     Arrival date & time 10/08/14  1208 History   First MD Initiated Contact with Patient 10/08/14 1220     Chief Complaint  Patient presents with  . Extremity Laceration     (Consider location/radiation/quality/duration/timing/severity/associated sxs/prior Treatment) HPI   43 year old female who presents for evaluation of left hand laceration. Patient report she was carrying a bottle of wine in her left hand while trying to open a door and accidentally broke the glass on the wine bottle. She suffered a laceration to the palm of her left hand. Incident happened an hour ago. No specific treatment tried. She admits to drinking a glass 1 this morning. Has history of alcohol abuse. She is up-to-date with her tetanus. She denies having any numbness distal to the injury. She is right-hand dominant. Denies any other injury.  Past Medical History  Diagnosis Date  . Alcohol abuse   . Depression   . Suicide attempt    Past Surgical History  Procedure Laterality Date  . Cosmetic surgery     No family history on file. History  Substance Use Topics  . Smoking status: Never Smoker   . Smokeless tobacco: Not on file  . Alcohol Use: 18.0 oz/week    30 Glasses of wine per week   OB History    No data available     Review of Systems  Constitutional: Negative for fever.  Skin: Positive for wound.  Neurological: Negative for numbness.      Allergies  Review of patient's allergies indicates no known allergies.  Home Medications   Prior to Admission medications   Medication Sig Start Date End Date Taking? Authorizing Provider  acamprosate (CAMPRAL) 333 MG tablet Take 2 tablets (666 mg total) by mouth 3 (three) times daily with meals. For alcoholism 05/18/14   Sanjuana KavaAgnes I Nwoko, NP  aspirin EC 81 MG tablet Take 1 tablet (81 mg total) by mouth daily. For heart health 05/18/14   Sanjuana KavaAgnes I Nwoko, NP  buPROPion (WELLBUTRIN XL) 150 MG 24 hr tablet Take 1 tablet (150 mg total) by mouth  daily. For depression 05/18/14   Sanjuana KavaAgnes I Nwoko, NP  ferrous sulfate 325 (65 FE) MG tablet Take 1 tablet (325 mg total) by mouth daily with breakfast. For low iron 05/18/14   Sanjuana KavaAgnes I Nwoko, NP  FLUoxetine (PROZAC) 10 MG capsule Take 1 capsule (10 mg total) by mouth daily. For depression 05/18/14   Sanjuana KavaAgnes I Nwoko, NP  Multiple Vitamin (MULTIVITAMIN WITH MINERALS) TABS tablet Take 1 tablet by mouth daily. For low vitamin 05/18/14   Sanjuana KavaAgnes I Nwoko, NP  traZODone (DESYREL) 50 MG tablet Take 1 tablet (50 mg total) by mouth at bedtime as needed for sleep. 05/18/14   Sanjuana KavaAgnes I Nwoko, NP   BP 121/80 mmHg  Pulse 89  Temp(Src) 97.7 F (36.5 C) (Oral)  Resp 18  SpO2 99% Physical Exam  Constitutional: She appears well-developed and well-nourished. No distress.  HENT:  Head: Atraumatic.  Eyes: Conjunctivae are normal.  Neck: Neck supple.  Neurological: She is alert.  Skin: No rash noted.  L hand: 5cm superficial laceration to palm of hand distal to thenar eminence.  No fb noted.  Actively bleeding, sensation decreased to 2nd finger distally.  FROM to all fingers with brisk cap refill.  Radial pulse 2+  Psychiatric: She has a normal mood and affect.  Nursing note and vitals reviewed.   ED Course  Procedures (including critical care time)  1:23 PM Patient suffered  a laceration to the palm of her nondominant hand. We'll cleans and sutured. Patient will follow-up in a week for suture removal. She had made aware to return if developing infection. Wound care instructions provided. Hand specialist referral as needed. She does have some subjective paresthesia to the pad of her second finger, this could be temporary but she knows to follow-up with hand specialist if it persisted.  Able to make a fist.    LACERATION REPAIR Performed by: Fayrene HelperRAN,BOWIE Authorized by: Fayrene HelperRAN,BOWIE Consent: Verbal consent obtained. Risks and benefits: risks, benefits and alternatives were discussed Consent given by: patient Patient  identity confirmed: provided demographic data Prepped and Draped in normal sterile fashion Wound explored  Laceration Location: L hand, palm  Laceration Length: 5cm  No Foreign Bodies seen or palpated  Anesthesia: local infiltration  Local anesthetic: lidocaine 2% w epinephrine  Anesthetic total: 4 ml  Irrigation method: syringe Amount of cleaning: standard  Skin closure: prolene 5.0  Number of sutures: 10  Technique: simple interrupted  Patient tolerance: Patient tolerated the procedure well with no immediate complications.  Labs Review Labs Reviewed - No data to display  Imaging Review No results found.   EKG Interpretation None      MDM   Final diagnoses:  Hand laceration, left, initial encounter    BP 121/80 mmHg  Pulse 89  Temp(Src) 97.7 F (36.5 C) (Oral)  Resp 18  SpO2 99%     Fayrene HelperBowie Tran, PA-C 10/08/14 1413  Raeford RazorStephen Kohut, MD 10/15/14 [phone]

## 2014-10-08 NOTE — ED Notes (Signed)
Pt reports was opening car door with glass bottle resulting in laceration between thumb and index finger to left hand. Upon assessment moderate left radial pulse. Visual muscle exposure at site.

## 2015-03-18 ENCOUNTER — Other Ambulatory Visit: Payer: Self-pay | Admitting: Physician Assistant

## 2015-03-18 DIAGNOSIS — Z1231 Encounter for screening mammogram for malignant neoplasm of breast: Secondary | ICD-10-CM

## 2015-03-25 ENCOUNTER — Other Ambulatory Visit: Payer: Self-pay

## 2015-03-25 ENCOUNTER — Ambulatory Visit
Admission: RE | Admit: 2015-03-25 | Discharge: 2015-03-25 | Disposition: A | Discharge status: Home / Self Care | Payer: Medicaid Other | Source: Ambulatory Visit | Attending: Physician Assistant | Admitting: Physician Assistant

## 2015-03-25 ENCOUNTER — Ambulatory Visit
Admission: RE | Admit: 2015-03-25 | Discharge: 2015-03-25 | Disposition: A | Discharge status: Home / Self Care | Payer: Medicaid Other | Source: Ambulatory Visit

## 2015-03-25 DIAGNOSIS — Z1231 Encounter for screening mammogram for malignant neoplasm of breast: Secondary | ICD-10-CM

## 2015-04-21 ENCOUNTER — Encounter (HOSPITAL_COMMUNITY): Payer: Self-pay

## 2015-04-21 ENCOUNTER — Emergency Department (HOSPITAL_COMMUNITY): Payer: Medicaid Other

## 2015-04-21 ENCOUNTER — Inpatient Hospital Stay (HOSPITAL_COMMUNITY)
Admission: EM | Admit: 2015-04-21 | Discharge: 2015-04-24 | DRG: 917 | Disposition: A | Discharge status: Home / Self Care | Payer: Medicaid Other | Attending: Internal Medicine | Admitting: Internal Medicine

## 2015-04-21 DIAGNOSIS — F1910 Other psychoactive substance abuse, uncomplicated: Secondary | ICD-10-CM | POA: Diagnosis present

## 2015-04-21 DIAGNOSIS — D696 Thrombocytopenia, unspecified: Secondary | ICD-10-CM | POA: Diagnosis present

## 2015-04-21 DIAGNOSIS — R739 Hyperglycemia, unspecified: Secondary | ICD-10-CM | POA: Diagnosis present

## 2015-04-21 DIAGNOSIS — Z7982 Long term (current) use of aspirin: Secondary | ICD-10-CM

## 2015-04-21 DIAGNOSIS — F1019 Alcohol abuse with unspecified alcohol-induced disorder: Secondary | ICD-10-CM | POA: Diagnosis not present

## 2015-04-21 DIAGNOSIS — E872 Acidosis, unspecified: Secondary | ICD-10-CM | POA: Diagnosis present

## 2015-04-21 DIAGNOSIS — E878 Other disorders of electrolyte and fluid balance, not elsewhere classified: Secondary | ICD-10-CM | POA: Diagnosis present

## 2015-04-21 DIAGNOSIS — E8729 Other acidosis: Secondary | ICD-10-CM | POA: Diagnosis present

## 2015-04-21 DIAGNOSIS — G9340 Encephalopathy, unspecified: Secondary | ICD-10-CM | POA: Diagnosis present

## 2015-04-21 DIAGNOSIS — Z79899 Other long term (current) drug therapy: Secondary | ICD-10-CM

## 2015-04-21 DIAGNOSIS — T43222A Poisoning by selective serotonin reuptake inhibitors, intentional self-harm, initial encounter: Secondary | ICD-10-CM | POA: Diagnosis present

## 2015-04-21 DIAGNOSIS — F1994 Other psychoactive substance use, unspecified with psychoactive substance-induced mood disorder: Secondary | ICD-10-CM

## 2015-04-21 DIAGNOSIS — R570 Cardiogenic shock: Secondary | ICD-10-CM | POA: Diagnosis present

## 2015-04-21 DIAGNOSIS — J9600 Acute respiratory failure, unspecified whether with hypoxia or hypercapnia: Secondary | ICD-10-CM | POA: Diagnosis present

## 2015-04-21 DIAGNOSIS — R221 Localized swelling, mass and lump, neck: Secondary | ICD-10-CM | POA: Diagnosis present

## 2015-04-21 DIAGNOSIS — R4020 Unspecified coma: Secondary | ICD-10-CM

## 2015-04-21 DIAGNOSIS — F1914 Other psychoactive substance abuse with psychoactive substance-induced mood disorder: Secondary | ICD-10-CM | POA: Diagnosis present

## 2015-04-21 DIAGNOSIS — T43592A Poisoning by other antipsychotics and neuroleptics, intentional self-harm, initial encounter: Secondary | ICD-10-CM | POA: Diagnosis present

## 2015-04-21 DIAGNOSIS — T402X2A Poisoning by other opioids, intentional self-harm, initial encounter: Secondary | ICD-10-CM | POA: Diagnosis present

## 2015-04-21 DIAGNOSIS — F1020 Alcohol dependence, uncomplicated: Secondary | ICD-10-CM | POA: Insufficient documentation

## 2015-04-21 DIAGNOSIS — N179 Acute kidney failure, unspecified: Secondary | ICD-10-CM | POA: Diagnosis present

## 2015-04-21 DIAGNOSIS — J9690 Respiratory failure, unspecified, unspecified whether with hypoxia or hypercapnia: Secondary | ICD-10-CM | POA: Diagnosis present

## 2015-04-21 DIAGNOSIS — F0630 Mood disorder due to known physiological condition, unspecified: Secondary | ICD-10-CM | POA: Diagnosis not present

## 2015-04-21 DIAGNOSIS — D649 Anemia, unspecified: Secondary | ICD-10-CM | POA: Diagnosis present

## 2015-04-21 DIAGNOSIS — T43292A Poisoning by other antidepressants, intentional self-harm, initial encounter: Secondary | ICD-10-CM | POA: Diagnosis present

## 2015-04-21 DIAGNOSIS — T50902S Poisoning by unspecified drugs, medicaments and biological substances, intentional self-harm, sequela: Secondary | ICD-10-CM

## 2015-04-21 DIAGNOSIS — I959 Hypotension, unspecified: Secondary | ICD-10-CM | POA: Diagnosis present

## 2015-04-21 DIAGNOSIS — F10129 Alcohol abuse with intoxication, unspecified: Secondary | ICD-10-CM | POA: Diagnosis not present

## 2015-04-21 DIAGNOSIS — M542 Cervicalgia: Secondary | ICD-10-CM

## 2015-04-21 DIAGNOSIS — F411 Generalized anxiety disorder: Secondary | ICD-10-CM | POA: Diagnosis present

## 2015-04-21 DIAGNOSIS — T43212A Poisoning by selective serotonin and norepinephrine reuptake inhibitors, intentional self-harm, initial encounter: Secondary | ICD-10-CM | POA: Diagnosis present

## 2015-04-21 DIAGNOSIS — F331 Major depressive disorder, recurrent, moderate: Secondary | ICD-10-CM | POA: Diagnosis present

## 2015-04-21 DIAGNOSIS — R4182 Altered mental status, unspecified: Secondary | ICD-10-CM

## 2015-04-21 DIAGNOSIS — Z23 Encounter for immunization: Secondary | ICD-10-CM | POA: Diagnosis not present

## 2015-04-21 DIAGNOSIS — E876 Hypokalemia: Secondary | ICD-10-CM | POA: Diagnosis present

## 2015-04-21 DIAGNOSIS — T50901A Poisoning by unspecified drugs, medicaments and biological substances, accidental (unintentional), initial encounter: Secondary | ICD-10-CM | POA: Diagnosis present

## 2015-04-21 DIAGNOSIS — T426X2A Poisoning by other antiepileptic and sedative-hypnotic drugs, intentional self-harm, initial encounter: Secondary | ICD-10-CM | POA: Diagnosis present

## 2015-04-21 HISTORY — DX: Poisoning by unspecified drugs, medicaments and biological substances, accidental (unintentional), initial encounter: T50.901A

## 2015-04-21 HISTORY — DX: Mood disorder due to known physiological condition, unspecified: F06.30

## 2015-04-21 HISTORY — DX: Other psychoactive substance use, unspecified with psychoactive substance-induced mood disorder: F19.94

## 2015-04-21 HISTORY — DX: Acute respiratory failure, unspecified whether with hypoxia or hypercapnia: J96.00

## 2015-04-21 HISTORY — DX: Anemia, unspecified: D64.9

## 2015-04-21 LAB — PROTIME-INR
INR: 0.93 (ref 0.00–1.49)
INR: 1 (ref 0.00–1.49)
Prothrombin Time: 12.7 seconds (ref 11.6–15.2)
Prothrombin Time: 13.4 seconds (ref 11.6–15.2)

## 2015-04-21 LAB — T4, FREE: Free T4: 0.96 ng/dL (ref 0.61–1.12)

## 2015-04-21 LAB — I-STAT CHEM 8, ED
BUN: 17 mg/dL (ref 6–20)
Calcium, Ion: 1.25 mmol/L — ABNORMAL HIGH (ref 1.12–1.23)
Chloride: 105 mmol/L (ref 101–111)
Creatinine, Ser: 1.8 mg/dL — ABNORMAL HIGH (ref 0.44–1.00)
Glucose, Bld: 237 mg/dL — ABNORMAL HIGH (ref 65–99)
HCT: 31 % — ABNORMAL LOW (ref 36.0–46.0)
Hemoglobin: 10.5 g/dL — ABNORMAL LOW (ref 12.0–15.0)
Potassium: 3.3 mmol/L — ABNORMAL LOW (ref 3.5–5.1)
Sodium: 144 mmol/L (ref 135–145)
TCO2: 24 mmol/L (ref 0–100)

## 2015-04-21 LAB — RAPID URINE DRUG SCREEN, HOSP PERFORMED
Amphetamines: NOT DETECTED
Barbiturates: NOT DETECTED
Benzodiazepines: NOT DETECTED
Cocaine: NOT DETECTED
Opiates: NOT DETECTED
Tetrahydrocannabinol: NOT DETECTED

## 2015-04-21 LAB — SALICYLATE LEVEL: Salicylate Lvl: <4 mg/dL (ref 2.8–30.0)

## 2015-04-21 LAB — CBC WITH DIFFERENTIAL/PLATELET
Basophils Absolute: 0 10*3/uL (ref 0.0–0.1)
Basophils Relative: 0 % (ref 0–1)
Eosinophils Absolute: 0.1 10*3/uL (ref 0.0–0.7)
Eosinophils Relative: 2 % (ref 0–5)
HCT: 31.8 % — ABNORMAL LOW (ref 36.0–46.0)
Hemoglobin: 10.5 g/dL — ABNORMAL LOW (ref 12.0–15.0)
Lymphocytes Relative: 35 % (ref 12–46)
Lymphs Abs: 1.7 10*3/uL (ref 0.7–4.0)
MCH: 32 pg (ref 26.0–34.0)
MCHC: 33 g/dL (ref 30.0–36.0)
MCV: 97 fL (ref 78.0–100.0)
Monocytes Absolute: 0.5 10*3/uL (ref 0.1–1.0)
Monocytes Relative: 10 % (ref 3–12)
Neutro Abs: 2.7 10*3/uL (ref 1.7–7.7)
Neutrophils Relative %: 53 % (ref 43–77)
Platelets: 146 10*3/uL — ABNORMAL LOW (ref 150–400)
RBC: 3.28 MIL/uL — ABNORMAL LOW (ref 3.87–5.11)
RDW: 14.6 % (ref 11.5–15.5)
WBC: 5 10*3/uL (ref 4.0–10.5)

## 2015-04-21 LAB — BASIC METABOLIC PANEL
Anion gap: 9 (ref 5–15)
BUN: 12 mg/dL (ref 6–20)
CO2: 18 mmol/L — ABNORMAL LOW (ref 22–32)
Calcium: 8.7 mg/dL — ABNORMAL LOW (ref 8.9–10.3)
Chloride: 116 mmol/L — ABNORMAL HIGH (ref 101–111)
Creatinine, Ser: 0.8 mg/dL (ref 0.44–1.00)
GFR calc Af Amer: >60 mL/min (ref >60)
GFR calc non Af Amer: >60 mL/min (ref >60)
Glucose, Bld: 151 mg/dL — ABNORMAL HIGH (ref 65–99)
Potassium: 4 mmol/L (ref 3.5–5.1)
Sodium: 143 mmol/L (ref 135–145)

## 2015-04-21 LAB — URINALYSIS, ROUTINE W REFLEX MICROSCOPIC
Bilirubin Urine: NEGATIVE
Glucose, UA: 100 mg/dL — AB
Hgb urine dipstick: NEGATIVE
Ketones, ur: NEGATIVE mg/dL
Leukocytes, UA: NEGATIVE
Nitrite: NEGATIVE
Protein, ur: NEGATIVE mg/dL
Specific Gravity, Urine: 1.012 (ref 1.005–1.030)
Urobilinogen, UA: 0.2 mg/dL (ref 0.0–1.0)
pH: 8 (ref 5.0–8.0)

## 2015-04-21 LAB — COMPREHENSIVE METABOLIC PANEL
ALT: 23 U/L (ref 14–54)
AST: 25 U/L (ref 15–41)
Albumin: 3.5 g/dL (ref 3.5–5.0)
Alkaline Phosphatase: 38 U/L (ref 38–126)
Anion gap: 9 (ref 5–15)
BUN: 17 mg/dL (ref 6–20)
CO2: 26 mmol/L (ref 22–32)
Calcium: 8.7 mg/dL — ABNORMAL LOW (ref 8.9–10.3)
Chloride: 109 mmol/L (ref 101–111)
Creatinine, Ser: 1.08 mg/dL — ABNORMAL HIGH (ref 0.44–1.00)
GFR calc Af Amer: >60 mL/min (ref >60)
GFR calc non Af Amer: >60 mL/min (ref >60)
Glucose, Bld: 167 mg/dL — ABNORMAL HIGH (ref 65–99)
Potassium: 3.2 mmol/L — ABNORMAL LOW (ref 3.5–5.1)
Sodium: 144 mmol/L (ref 135–145)
Total Bilirubin: 1 mg/dL (ref 0.3–1.2)
Total Protein: 5.9 g/dL — ABNORMAL LOW (ref 6.5–8.1)

## 2015-04-21 LAB — PROCALCITONIN: Procalcitonin: <0.1 ng/mL

## 2015-04-21 LAB — LACTIC ACID, PLASMA: Lactic Acid, Venous: 2.7 mmol/L (ref 0.5–2.0)

## 2015-04-21 LAB — HEPATIC FUNCTION PANEL
ALT: 24 U/L (ref 14–54)
AST: 27 U/L (ref 15–41)
Albumin: 3.9 g/dL (ref 3.5–5.0)
Alkaline Phosphatase: 39 U/L (ref 38–126)
Bilirubin, Direct: <0.1 mg/dL — ABNORMAL LOW (ref 0.1–0.5)
Total Bilirubin: 0.5 mg/dL (ref 0.3–1.2)
Total Protein: 6.9 g/dL (ref 6.5–8.1)

## 2015-04-21 LAB — I-STAT CG4 LACTIC ACID, ED: Lactic Acid, Venous: 2.72 mmol/L (ref 0.5–2.0)

## 2015-04-21 LAB — BLOOD GAS, ARTERIAL
Acid-base deficit: 2 mmol/L (ref 0.0–2.0)
Bicarbonate: 22.2 mEq/L (ref 20.0–24.0)
Drawn by: 103701
FIO2: 1 %
MECHVT: 550 mL
O2 Saturation: 99.7 %
PEEP: 5 cmH2O
Patient temperature: 37
RATE: 14 resp/min
TCO2: 20.7 mmol/L (ref 0–100)
pCO2 arterial: 38 mmHg (ref 35.0–45.0)
pH, Arterial: 7.384 (ref 7.350–7.450)
pO2, Arterial: 542 mmHg — ABNORMAL HIGH (ref 80.0–100.0)

## 2015-04-21 LAB — LIPASE, BLOOD: Lipase: 55 U/L — ABNORMAL HIGH (ref 22–51)

## 2015-04-21 LAB — ACETAMINOPHEN LEVEL
Acetaminophen (Tylenol), Serum: <10 ug/mL — ABNORMAL LOW (ref 10–30)
Acetaminophen (Tylenol), Serum: <10 ug/mL — ABNORMAL LOW (ref 10–30)

## 2015-04-21 LAB — GLUCOSE, CAPILLARY
Glucose-Capillary: 147 mg/dL — ABNORMAL HIGH (ref 65–99)
Glucose-Capillary: 150 mg/dL — ABNORMAL HIGH (ref 65–99)
Glucose-Capillary: 164 mg/dL — ABNORMAL HIGH (ref 65–99)

## 2015-04-21 LAB — TSH: TSH: 1.673 u[IU]/mL (ref 0.350–4.500)

## 2015-04-21 LAB — CBG MONITORING, ED: Glucose-Capillary: 258 mg/dL — ABNORMAL HIGH (ref 65–99)

## 2015-04-21 LAB — ETHANOL: Alcohol, Ethyl (B): 361 mg/dL (ref <5)

## 2015-04-21 LAB — MRSA PCR SCREENING: MRSA by PCR: NEGATIVE

## 2015-04-21 LAB — POC URINE PREG, ED: Preg Test, Ur: NEGATIVE

## 2015-04-21 MED ORDER — PNEUMOCOCCAL VAC POLYVALENT 25 MCG/0.5ML IJ INJ
0.5000 mL | INJECTION | INTRAMUSCULAR | Status: AC
  Administered 2015-04-22: 0.5 mL via INTRAMUSCULAR
  Filled 2015-04-21 (×2): qty 0.5

## 2015-04-21 MED ORDER — MAGNESIUM SULFATE 4 GM/100ML IV SOLN
4.0000 g | Freq: Once | INTRAVENOUS | Status: AC
  Administered 2015-04-21: 4 g via INTRAVENOUS
  Filled 2015-04-21: qty 100

## 2015-04-21 MED ORDER — DEXAMETHASONE SODIUM PHOSPHATE 10 MG/ML IJ SOLN
10.0000 mg | Freq: Three times a day (TID) | INTRAMUSCULAR | Status: AC
  Administered 2015-04-21 – 2015-04-22 (×3): 10 mg via INTRAVENOUS
  Filled 2015-04-21 (×3): qty 1

## 2015-04-21 MED ORDER — SODIUM CHLORIDE 0.9 % IV SOLN
25.0000 ug/h | INTRAVENOUS | Status: DC
  Administered 2015-04-21: 50 ug/h via INTRAVENOUS
  Administered 2015-04-21: 25 ug/h via INTRAVENOUS
  Filled 2015-04-21: qty 50

## 2015-04-21 MED ORDER — ROCURONIUM BROMIDE 50 MG/5ML IV SOLN
INTRAVENOUS | Status: AC | PRN
  Administered 2015-04-21: 100 mg via INTRAVENOUS

## 2015-04-21 MED ORDER — CHLORHEXIDINE GLUCONATE 0.12 % MT SOLN
15.0000 mL | Freq: Two times a day (BID) | OROMUCOSAL | Status: DC
  Administered 2015-04-21 – 2015-04-22 (×3): 15 mL via OROMUCOSAL
  Filled 2015-04-21 (×3): qty 15

## 2015-04-21 MED ORDER — SODIUM CHLORIDE 0.9 % IV SOLN
25.0000 ug/h | INTRAVENOUS | Status: DC
  Filled 2015-04-21 (×2): qty 50

## 2015-04-21 MED ORDER — LIDOCAINE HCL (CARDIAC) 20 MG/ML IV SOLN
INTRAVENOUS | Status: AC
  Filled 2015-04-21: qty 5

## 2015-04-21 MED ORDER — HEPARIN SODIUM (PORCINE) 5000 UNIT/ML IJ SOLN
5000.0000 [IU] | Freq: Three times a day (TID) | INTRAMUSCULAR | Status: DC
  Administered 2015-04-21 – 2015-04-24 (×10): 5000 [IU] via SUBCUTANEOUS
  Filled 2015-04-21 (×13): qty 1

## 2015-04-21 MED ORDER — FENTANYL CITRATE (PF) 100 MCG/2ML IJ SOLN: 50.0000 ug | Freq: Once | INTRAMUSCULAR | Status: DC

## 2015-04-21 MED ORDER — NOREPINEPHRINE BITARTRATE 1 MG/ML IV SOLN
0.0000 ug/min | Freq: Once | INTRAVENOUS | Status: DC
  Filled 2015-04-21: qty 4

## 2015-04-21 MED ORDER — POTASSIUM CHLORIDE 10 MEQ/100ML IV SOLN
10.0000 meq | INTRAVENOUS | Status: AC
  Administered 2015-04-21 (×4): 10 meq via INTRAVENOUS
  Filled 2015-04-21 (×4): qty 100

## 2015-04-21 MED ORDER — SODIUM BICARBONATE 8.4 % IV SOLN
INTRAVENOUS | Status: AC | PRN
  Administered 2015-04-21 (×3): 50 meq via INTRAVENOUS

## 2015-04-21 MED ORDER — CETYLPYRIDINIUM CHLORIDE 0.05 % MT LIQD
7.0000 mL | Freq: Four times a day (QID) | OROMUCOSAL | Status: DC
  Administered 2015-04-21 – 2015-04-23 (×9): 7 mL via OROMUCOSAL

## 2015-04-21 MED ORDER — ETOMIDATE 2 MG/ML IV SOLN
INTRAVENOUS | Status: AC
  Filled 2015-04-21: qty 20

## 2015-04-21 MED ORDER — LORAZEPAM 2 MG/ML IJ SOLN: 2.0000 mg | Freq: Once | INTRAMUSCULAR | Status: DC

## 2015-04-21 MED ORDER — CHARCOAL ACTIVATED PO LIQD
50.0000 g | Freq: Once | ORAL | Status: AC
  Administered 2015-04-21: 50 g
  Filled 2015-04-21: qty 240

## 2015-04-21 MED ORDER — SUCCINYLCHOLINE CHLORIDE 20 MG/ML IJ SOLN
INTRAMUSCULAR | Status: AC
  Filled 2015-04-21: qty 1

## 2015-04-21 MED ORDER — SODIUM CHLORIDE 0.9 % IV SOLN: 250.0000 mL | INTRAVENOUS | Status: DC | PRN

## 2015-04-21 MED ORDER — IOHEXOL 300 MG/ML  SOLN
100.0000 mL | Freq: Once | INTRAMUSCULAR | Status: AC | PRN
  Administered 2015-04-21: 80 mL via INTRAVENOUS

## 2015-04-21 MED ORDER — MIDAZOLAM HCL 2 MG/2ML IJ SOLN
2.0000 mg | INTRAMUSCULAR | Status: DC | PRN
  Administered 2015-04-21 (×2): 2 mg via INTRAVENOUS
  Filled 2015-04-21 (×2): qty 2

## 2015-04-21 MED ORDER — INSULIN ASPART 100 UNIT/ML ~~LOC~~ SOLN
0.0000 [IU] | SUBCUTANEOUS | Status: DC
  Administered 2015-04-21: 2 [IU] via SUBCUTANEOUS
  Administered 2015-04-21 (×2): 1 [IU] via SUBCUTANEOUS
  Administered 2015-04-22 (×3): 2 [IU] via SUBCUTANEOUS
  Administered 2015-04-22: 1 [IU] via SUBCUTANEOUS
  Administered 2015-04-22: 2 [IU] via SUBCUTANEOUS
  Administered 2015-04-22 – 2015-04-23 (×3): 1 [IU] via SUBCUTANEOUS

## 2015-04-21 MED ORDER — PANTOPRAZOLE SODIUM 40 MG IV SOLR
40.0000 mg | Freq: Every day | INTRAVENOUS | Status: DC
  Administered 2015-04-21: 40 mg via INTRAVENOUS
  Filled 2015-04-21: qty 40

## 2015-04-21 MED ORDER — SODIUM CHLORIDE 0.9 % IV BOLUS (SEPSIS)
1000.0000 mL | Freq: Once | INTRAVENOUS | Status: AC
  Administered 2015-04-21: 1000 mL via INTRAVENOUS

## 2015-04-21 MED ORDER — DEXTROSE IN LACTATED RINGERS 5 % IV SOLN
INTRAVENOUS | Status: DC
  Administered 2015-04-21: 125 mL/h via INTRAVENOUS
  Administered 2015-04-22: 15:00:00 via INTRAVENOUS

## 2015-04-21 MED ORDER — MIDAZOLAM HCL 2 MG/2ML IJ SOLN
2.0000 mg | INTRAMUSCULAR | Status: DC | PRN
  Filled 2015-04-21: qty 2

## 2015-04-21 MED ORDER — FENTANYL BOLUS VIA INFUSION
50.0000 ug | INTRAVENOUS | Status: DC | PRN
  Filled 2015-04-21: qty 50

## 2015-04-21 MED ORDER — ETOMIDATE 2 MG/ML IV SOLN
INTRAVENOUS | Status: DC | PRN
  Administered 2015-04-21: 10 mg via INTRAVENOUS

## 2015-04-21 MED ORDER — SODIUM CHLORIDE 0.9 % IV BOLUS (SEPSIS)
2000.0000 mL | Freq: Once | INTRAVENOUS | Status: AC
  Administered 2015-04-21: 2000 mL via INTRAVENOUS

## 2015-04-21 MED ORDER — ROCURONIUM BROMIDE 50 MG/5ML IV SOLN
INTRAVENOUS | Status: AC
  Filled 2015-04-21: qty 2

## 2015-04-21 MED ORDER — SODIUM CHLORIDE 0.9 % IV SOLN
3.0000 g | Freq: Four times a day (QID) | INTRAVENOUS | Status: DC
  Administered 2015-04-21 – 2015-04-22 (×3): 3 g via INTRAVENOUS
  Filled 2015-04-21 (×5): qty 3

## 2015-04-21 MED ORDER — SODIUM CHLORIDE 0.9 % IV SOLN
3.0000 g | Freq: Once | INTRAVENOUS | Status: AC
  Administered 2015-04-21: 3 g via INTRAVENOUS
  Filled 2015-04-21: qty 3

## 2015-04-21 MED FILL — Medication: Qty: 1 | Status: AC

## 2015-04-21 NOTE — ED Notes (Signed)
Patient arrived via EMS following medication overdose (unknown amount of: fluoxetine, hydrocodone, buspar, trazodone, seroquel, bupropion, toprimate) and large amount of ETOH consumption.  She is currently unresponsive with a nasal trumpet in place to maintain her airway.

## 2015-04-21 NOTE — Progress Notes (Signed)
eLink Physician-Brief Progress Note Patient Name: Kelsey [name] DOB: [birth year] MRN: [id number]   Date of Service  04/21/2015  HPI/Events of Note  Poison control requests: BMP and Iron Level. QTc interval = 0.47 seconds.   eICU Interventions  Will order BMP and iron level.      Intervention Category Minor Interventions: Routine modifications to care plan (e.g. PRN medications for pain, fever)  Sommer,Steven Eugene 04/21/2015, 7:03 PM

## 2015-04-21 NOTE — ED Provider Notes (Signed)
08:40- I have discussed the CT results, with the radiologist, Dr. Chestine Spore. Neck reexamined this time shows decreased swelling as compared to findings prior to the CT scan. I visualized the neck, with Dr. Mora Bellman, just after intubation. At that time there was external appearance of thyroid goiter, left greater than right with some midline swelling in the area of the thyroid isthmus. Thyroid gland was not palpable at that time. And there was no crepitation, at that time. At the time of this dictation, the neck appears less swollen, the trachea appears in the midline, and there is no crepitation. Patient is being maintained on a ventilator, with normal oxygen saturation. She has mild hypotension at this time. Fentanyl drip is being titrated.  Consultation with ENT completed at 08:46, Dr. Emeline Darling, will evaluate the patient as a Research scientist (medical).  Mancel Bale, MD 04/21/15 [phone]

## 2015-04-21 NOTE — ED Notes (Signed)
Called ICU back.  They stated that the bed is being zapped.  Will reassess.

## 2015-04-21 NOTE — ED Notes (Signed)
Poison Control called:  Watch for prolonged QTC Delayed seizures Hypokalemia Tachycardia Hypotension Hypertension Lethargy CNS/respiratory depression Liver toxicity Drowsiness Hyperthermia Metabolic acidosis Coma Ventricular dysrhythmias  Obtain levels for: Tylenol CMP Salicylate Iron  Give:  N-acetylcysteine (loading dose through OG tube if tylenol comes back elevated) Supportive care Pressors if needed benzos for seizures If prolonged QTC, check K & mag Replace those to high normal

## 2015-04-21 NOTE — ED Notes (Signed)
Pt now only minimally responsible to painful stimuli, Dr. Mora Bellman at bedside, pt prepped for intubation.  0654-Sodium Bicarb 1amp IVP, KAK 0654-Calcium Chloride 1amp IVP,KAK 0655-Sodium Bicarb 1amp IVP,KAK Pt being ventilated by BVM by T.Doster at this time 0659-Etomidate 10mg  IVP KAK 0659-Rocuronium 100mg  IVP KAK 0700-1st attempt at intubation 8.0 tube by Dr. Mora Bellman, no color change, vomitus in tube 0701-Sodium Bicarb 1amp IVP KAK 0702-2nd attempt intubation Dr. Mora Bellman, 7.5 tube 2 passes, successful with bougie, +color change, +LS confirmed x 2.

## 2015-04-21 NOTE — ED Notes (Signed)
Bed: RESB Expected date:  Expected time:  Means of arrival:  Comments: Unresponsive overdose

## 2015-04-21 NOTE — Progress Notes (Signed)
Patient shaking entire bed and beating side rails and foot board.  Restarted sedation. Rass 3.

## 2015-04-21 NOTE — Consult Note (Signed)
Kelsey [name], Kelsey [name] [id number] [birth year] [name], MD  Reason for Consult: neck swelling  HPI: 44 YO intubated in ER, had some transient neck swelling so ER checked a Ct scan that showed some mild/moderate thyroid gland edema and leftward laryngeal framework shift. This resolved within a few minutes. ENt consulted for neck swelling.  Allergies: No Known Allergies  ROS: unable to obtain x 12 systems as patient is intubated.  PMH:  Past Medical History  Diagnosis Date  . Alcohol abuse   . Depression   . Suicide attempt     FH: No family history on file.  SH:  History   Social History  . Marital Status: Single    Spouse Name: N/A  . Number of Children: N/A  . Years of Education: N/A   Occupational History  . Not on file.   Social History Main Topics  . Smoking status: Never Smoker   . Smokeless tobacco: Not on file  . Alcohol Use: 18.0 oz/week    30 Glasses of wine per week  . Drug Use: No  . Sexual Activity: No   Other Topics Concern  . Not on file   Social History Narrative    PSH:  Past Surgical History  Procedure Laterality Date  . Cosmetic surgery      Physical  Exam: CN 2-12 grossly intact and symmetric but limited exam as patient intubated. EAC/TMs normal BL. Oral cavity with ETT in place.  Skin warm and dry. Nasal cavity without polyps or purulence. External nose and ears without masses or lesions. Neck supple with no masses or lesions. No lymphadenopathy palpated. Thyroid normal with no masses or edema palpated.   A/P: transient neck swelling that appears to have completely resolved. This was likely related to patient Valsalva during or around time of intubation or mild intubation trauma. This appears to have completely resolved as exam is completely normal now. Additionally there is no subcutaneous emphysema or crepitance on exam or on CT scan, so laryngeal framework injury or rupture is very unlikely. Would give decadron 10mg  IV Q8 hours x 3 doses  and some empiric IV antibiotics and can extubate when clinically indicated. Can follow up as needed. No indication for surgical intervention.   Melvenia Beam 04/21/2015 9:23 AM

## 2015-04-21 NOTE — ED Notes (Signed)
Pt's QTC has increased from 443 to 536.  Dr. Delton Coombes notified.

## 2015-04-21 NOTE — ED Provider Notes (Signed)
CSN: [id number]     Arrival date & time 04/21/15  0630 History   First MD Initiated Contact with Patient 04/21/15 [phone]     Chief Complaint  Patient presents with  . Drug Overdose     (Consider location/radiation/quality/duration/timing/severity/associated sxs/prior Treatment) HPI  Kelsey [name] is a 44 yo female, PMH of depression and SI attempts, here with acute overdose on unknown substance.  Patient had multiple pill bottles at the scene.  See nurse notes for the pill names.  History could not be obtained due to AMS.    Past Medical History  Diagnosis Date  . Alcohol abuse   . Depression   . Suicide attempt    Past Surgical History  Procedure Laterality Date  . Cosmetic surgery     No family history on file. History  Substance Use Topics  . Smoking status: Never Smoker   . Smokeless tobacco: Not on file  . Alcohol Use: 18.0 oz/week    30 Glasses of wine per week   OB History    No data available     Review of Systems  Unable to perform ROS: Mental status change      Allergies  Review of patient's allergies indicates no known allergies.  Home Medications   Prior to Admission medications   Medication Sig Start Date End Date Taking? Authorizing Provider  acamprosate (CAMPRAL) 333 MG tablet Take 2 tablets (666 mg total) by mouth 3 (three) times daily with meals. For alcoholism Patient not taking: Reported on 10/08/2014 05/18/14   Sanjuana Kava, NP  aspirin EC 81 MG tablet Take 1 tablet (81 mg total) by mouth daily. For heart health Patient not taking: Reported on 10/08/2014 05/18/14   Sanjuana Kava, NP  buPROPion (WELLBUTRIN XL) 150 MG 24 hr tablet Take 1 tablet (150 mg total) by mouth daily. For depression Patient not taking: Reported on 10/08/2014 05/18/14   Sanjuana Kava, NP  ferrous sulfate 325 (65 FE) MG tablet Take 1 tablet (325 mg total) by mouth daily with breakfast. For low iron Patient not taking: Reported on 10/08/2014 05/18/14   Sanjuana Kava, NP   FLUoxetine (PROZAC) 10 MG capsule Take 1 capsule (10 mg total) by mouth daily. For depression Patient not taking: Reported on 10/08/2014 05/18/14   Sanjuana Kava, NP  HYDROcodone-acetaminophen (NORCO/VICODIN) 5-325 MG per tablet Take 1 tablet by mouth every 6 (six) hours as needed for moderate pain. 10/08/14   Fayrene Helper, PA-C  Multiple Vitamin (MULTIVITAMIN WITH MINERALS) TABS tablet Take 1 tablet by mouth daily. For low vitamin 05/18/14   Sanjuana Kava, NP  traZODone (DESYREL) 50 MG tablet Take 1 tablet (50 mg total) by mouth at bedtime as needed for sleep. 05/18/14   Sanjuana Kava, NP   SpO2 88%  LMP 03/06/2015 Physical Exam  Constitutional: She appears well-developed and well-nourished. No distress.  HENT:  Head: Normocephalic and atraumatic.  Nose: Nose normal.  Mouth/Throat: Oropharynx is clear and moist. No oropharyngeal exudate.  Eyes: Conjunctivae and EOM are normal. Pupils are equal, round, and reactive to light. No scleral icterus.  Neck: Normal range of motion. Neck supple. No JVD present. No tracheal deviation present. No thyromegaly present.  Cardiovascular: Normal rate, regular rhythm and normal heart sounds.  Exam reveals no gallop and no friction rub.   No murmur heard. Pulmonary/Chest: Effort normal and breath sounds normal. No respiratory distress. She has no wheezes. She exhibits no tenderness.  Abdominal: Soft. Bowel sounds are normal.  She exhibits no distension and no mass. There is no tenderness. There is no rebound and no guarding.  Musculoskeletal: Normal range of motion. She exhibits no edema or tenderness.  Lymphadenopathy:    She has no cervical adenopathy.  Neurological:  Responds to physical stimuli only  Skin: Skin is warm and dry. No rash noted. She is not diaphoretic. No erythema. No pallor.  Nursing note and vitals reviewed.   ED Course  Procedures (including critical care time) Labs Review Labs Reviewed  CBC WITH DIFFERENTIAL/PLATELET - Abnormal;  Notable for the following:    RBC 3.28 (*)    Hemoglobin 10.5 (*)    HCT 31.8 (*)    Platelets 146 (*)    All other components within normal limits  PROTIME-INR  LIPASE, BLOOD  URINALYSIS, ROUTINE W REFLEX MICROSCOPIC (NOT AT [facility])  URINE RAPID DRUG SCREEN, HOSP PERFORMED  ETHANOL  SALICYLATE LEVEL  ACETAMINOPHEN LEVEL  HEPATIC FUNCTION PANEL  TSH  T4, FREE  I-STAT CHEM 8, ED  I-STAT CG4 LACTIC ACID, ED  CBG MONITORING, ED  POC URINE PREG, ED    Imaging Review Dg Chest Port 1 View  04/21/2015   CLINICAL DATA:  Altered mental status.  Unresponsive  EXAM: PORTABLE CHEST - 1 VIEW  COMPARISON:  None.  FINDINGS: The heart size and mediastinal contours are within normal limits. Both lungs are clear. The visualized skeletal structures are unremarkable.  IMPRESSION: No active disease.   Electronically Signed   By: Marlan Palau M.D.   On: 04/21/2015 06:58     EKG Interpretation None      MDM   Final diagnoses:  Altered mental status  Altered mental status    Patient presents to the ED for AMS due to overdose.  I was called back to the room for hypotension and no responsiveness.  Patient was immediately given sodium bicarb and calcium for treatment.  2L IVF given as well.  NE gtt is at bedside.  Prior to intubation, patient BP rose back up to 120 systolic from 50 systolic. Unsure if this is from bicarb or calcium.  Intubation was successful after 2 attempts.  Patient did vomit during first attempt.    Charcoal was placed down the OG tube.  CC called and will see the patient in the ED.  Nurse concerned for worsening swelling of neck.  Will evaluate with CT scan  CRITICAL CARE Performed by: Tomasita Crumble   Total critical care time:  Critical care time was exclusive of separately billable procedures and treating other patients.  Critical care was necessary to treat or prevent imminent or life-threatening deterioration.  Critical care was time spent personally by me on  the following activities: development of treatment plan with patient and/or surrogate as well as nursing, discussions with consultants, evaluation of patient's response to treatment, examination of patient, obtaining history from patient or surrogate, ordering and performing treatments and interventions, ordering and review of laboratory studies, ordering and review of radiographic studies, pulse oximetry and re-evaluation of patient's condition.   INTUBATION Performed by: Tomasita Crumble  Required items: required blood products, implants, devices, and special equipment available Patient identity confirmed: provided demographic data and hospital-assigned identification number Time out: Immediately prior to procedure a "time out" was called to verify the correct patient, procedure, equipment, support staff and site/side marked as required.  Indications: respiratory failure  Intubation method: Direct Laryngoscopy   Preoxygenation: 15L [place]  Sedatives: Etomidate Paralytic:  Rocuronium  Tube Size: 7-5 cuffed  Post-procedure assessment:  chest rise and ETCO2 monitor Breath sounds: equal and absent over the epigastrium Tube secured with: ETT holder Chest x-ray interpreted by radiologist and me.  Chest x-ray findings: endotracheal tube in appropriate position  Patient tolerated the procedure well with no immediate complications.      Tomasita Crumble, MD 04/21/15 [phone]

## 2015-04-21 NOTE — Progress Notes (Signed)
Date:  April 21, 2015 U.R. performed for needs and level of care. Multiple pharmacy overdose requiring intubation.  Will continue to follow for Case Management needs.  Marcelle Smiling, RN, BSN, [place]   [phone]

## 2015-04-21 NOTE — ED Notes (Signed)
Pt has a bed on ICU but due to staffing, they are unable to accept patient at this time.  Will call back.

## 2015-04-21 NOTE — ED Notes (Signed)
This Consulting civil engineer spoke w/ Pt's mother.  Mother reports that the Pt cycles between "being successful and using drugs and drinking.  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  She'll blow the paycheck on alcohol, lottery tickets, and other stuff.  She has a son that lives w/ his father in [place].  She takes pills to lose weight and constantly goes off and on diets."  Sts she has been on this cycle for many years, but has never been diagnosed w/ anything.  Mother reports that Pt's sister has Bipolar and son has been diagnosed w/ Williams syndrome.  Pt's Mother: June Slight [phone] home and [phone] cell.

## 2015-04-21 NOTE — Care Management Note (Signed)
Case Management Note  Patient Details  Name: EVELYNROSE BRENER MRN: [id number] Date of Birth: June 29, 1971  Subjective/Objective:                  overdose  Action/Plan:tbd   Expected Discharge Date:   (unknown)          [id number]     [facility]  In-House Referral:  Clinical Social Work  Discharge planning Services  CM Consult  Post Acute Care Choice:  NA Choice offered to:  NA  DME Arranged:    DME Agency:     HH Arranged:    HH Agency:     Status of Service:  In process, will continue to follow  Medicare Important Message Given:    Date Medicare IM Given:    Medicare IM give by:    Date Additional Medicare IM Given:    Additional Medicare Important Message give by:     If discussed at Long Length of Stay Meetings, dates discussed:    Additional Comments:  Golda Acre, RN 04/21/2015, 11:25 AM

## 2015-04-21 NOTE — Progress Notes (Signed)
ANTIBIOTIC CONSULT NOTE - INITIAL  Pharmacy Consult for Unasyn Indication: Empiric coverage, risk of esophageal perf or tracheal trauma  No Known Allergies  Patient Measurements: Height: 5\' 10"  (177.8 cm) Weight: 145 lb (65.772 kg) IBW/kg (Calculated) : 68.5  Vital Signs: Temp: 97.3 F (36.3 C) (06/16 1015) BP: 79/50 mmHg (06/16 1015) Pulse Rate: 82 (06/16 1015) Intake/Output from previous day:   Intake/Output from this shift:    Labs:  Recent Labs  04/21/15 0641 04/21/15 0730  WBC 5.0  --   HGB 10.5* 10.5*  PLT 146*  --   CREATININE  --  1.80*   Estimated Creatinine Clearance: 41.9 mL/min (by C-G formula based on Cr of 1.8). No results for input(s): VANCOTROUGH, VANCOPEAK, VANCORANDOM, GENTTROUGH, GENTPEAK, GENTRANDOM, TOBRATROUGH, TOBRAPEAK, TOBRARND, AMIKACINPEAK, AMIKACINTROU, AMIKACIN in the last 72 hours.   Microbiology: No results found for this or any previous visit (from the past 720 hour(s)).  Medical History: Past Medical History  Diagnosis Date  . Alcohol abuse   . Depression   . Suicide attempt      Assessment: 44 y/o F with PMH of alcohol abuse, depression, suicide attempt who presents to [facility] ED after intentional overdose with fluoxetine, hydrocodone, buspar, trazodone, seroquel, bupropion, and topiramate. Patient intubated in ED. Patient vomited during procedure. Patient was noted to have neck swelling post-intubation and ENT evaluated. Pharmacy consulted to assist with dosing of Unasyn for empiric coverage d/t patient being at risk of esophageal perf or tracheal trauma.   6/16 >> Unasyn >>    6/16 trach aspirate: ordered    Hypothermic  WBC WNL  AKI, SCr 1.8 with CrCl ~ 42 ml/min CG   Goal of Therapy:  Appropriate antibiotic dosing for renal function and indication Eradication of infection  Plan:   Unasyn 3g IV q6h  Monitor renal function, culture results as available, clinical course.   Greer Pickerel, PharmD, BCPS Pager:  [phone] 04/21/2015 10:33 AM

## 2015-04-21 NOTE — H&P (Signed)
PULMONARY / CRITICAL CARE MEDICINE   Name: Kelsey [name] MRN: [id number] DOB: 11-12-1970    ADMISSION DATE:  04/21/2015 CONSULTATION DATE:  6/16  REFERRING MD :  Effie Shy  CHIEF COMPLAINT:  Overdose; acute encephalopathy and inability to protect airway   INITIAL PRESENTATION:  44 year old female admitted on 6/16 after intentional polysubstance overdose which included: fluoxetine, hydrocodone, buspar, trazedone, seroquel, bupropion and toprimate. Intubated for airway protection. PCCM asked to admit.    STUDIES:  CT neck 6/16: The hyoid bone is mildly displaced to the left. There is more significant displacement of the thyroid cartilage to the left. This raises the possibility of thyrohyoid ligament disruption or cartilage injury.Diffuse edema throughout the thyroid gland with fluid surrounding the thyroid gland bilaterally, question trauma to the thyroid gland.  SIGNIFICANT EVENTS:  HISTORY OF PRESENT ILLNESS:   44 year old female presented to ED after intentional overdose w/ fluoxetine, hydrocodone, buspar, trazedone, seroquel, bupropion and toprimate. Quantity of each unknown. On arrival to ER was drowsy. This got progressively worse to point where she became completely unresponsive and hypotensive. She was intubated for airway protection. Intubation noted to be difficult and esophagus was initially intubated. Pt vomited during procedure. Then successfully intubated w/ no report of hypoxia. Post-intubation ER team noted neck swelling of unclear etiology. CT neck obtained. This was negative for free air. ENT was called to eval. Ultimately PCCM asked to admit.   PAST MEDICAL HISTORY :   has a past medical history of Alcohol abuse; Depression; and Suicide attempt.  has past surgical history that includes Cosmetic surgery. Prior to Admission medications   Medication Sig Start Date End Date Taking? Authorizing Provider  acamprosate (CAMPRAL) 333 MG tablet Take 2 tablets (666 mg total) by  mouth 3 (three) times daily with meals. For alcoholism Patient not taking: Reported on 10/08/2014 05/18/14   Sanjuana Kava, NP  aspirin EC 81 MG tablet Take 1 tablet (81 mg total) by mouth daily. For heart health Patient not taking: Reported on 10/08/2014 05/18/14   Sanjuana Kava, NP  buPROPion (WELLBUTRIN XL) 150 MG 24 hr tablet Take 1 tablet (150 mg total) by mouth daily. For depression Patient not taking: Reported on 10/08/2014 05/18/14   Sanjuana Kava, NP  ferrous sulfate 325 (65 FE) MG tablet Take 1 tablet (325 mg total) by mouth daily with breakfast. For low iron Patient not taking: Reported on 10/08/2014 05/18/14   Sanjuana Kava, NP  FLUoxetine (PROZAC) 10 MG capsule Take 1 capsule (10 mg total) by mouth daily. For depression Patient not taking: Reported on 10/08/2014 05/18/14   Sanjuana Kava, NP  HYDROcodone-acetaminophen (NORCO/VICODIN) 5-325 MG per tablet Take 1 tablet by mouth every 6 (six) hours as needed for moderate pain. 10/08/14   Fayrene Helper, PA-C  Multiple Vitamin (MULTIVITAMIN WITH MINERALS) TABS tablet Take 1 tablet by mouth daily. For low vitamin 05/18/14   Sanjuana Kava, NP  traZODone (DESYREL) 50 MG tablet Take 1 tablet (50 mg total) by mouth at bedtime as needed for sleep. 05/18/14   Sanjuana Kava, NP   No Known Allergies  FAMILY HISTORY:  has no family status information on file.  SOCIAL HISTORY:  reports that she has never smoked. She does not have any smokeless tobacco history on file. She reports that she drinks about 18.0 oz of alcohol per week. She reports that she does not use illicit drugs.  REVIEW OF SYSTEMS:  Unable    VITAL SIGNS: Temp:  [  95.4 F (35.2 C)-96.5 F (35.8 C)] 96.5 F (35.8 C) (06/16 0900) Pulse Rate:  [88-136] 88 (06/16 0900) Resp:  [0-24] 14 (06/16 0900) BP: (53-150)/(41-87) 84/50 mmHg (06/16 0900) SpO2:  [88 %-100 %] 100 % (06/16 0900) FiO2 (%):  [100 %] 100 % (06/16 0722) HEMODYNAMICS:   VENTILATOR SETTINGS: Vent Mode:  [-] PRVC FiO2 (%):   [100 %] 100 % Set Rate:  [14 bmp] 14 bmp Vt Set:  [550 mL] 550 mL PEEP:  [5 cmH20] 5 cmH20 Plateau Pressure:  [13 cmH20] 13 cmH20 INTAKE / OUTPUT: No intake or output data in the 24 hours ending 04/21/15 0909  PHYSICAL EXAMINATION: General: 44 year old female, well nourished. Appears her age, currently on full vent support Neuro:  Sedated, was confused then lethargic prior to intubation. No rigidity  HEENT:  Orally intubated. Trachea midline. No Crepitus or swelling noted.  Cardiovascular:  rrr Lungs:  Clear  Abdomen:  Soft, non-tender No OM, + bowel sounds  Musculoskeletal:  Intact  Skin:  Dry, warm, intact, no edema   LABS:  CBC  Recent Labs Lab 04/21/15 0641 04/21/15 0730  WBC 5.0  --   HGB 10.5* 10.5*  HCT 31.8* 31.0*  PLT 146*  --    Coag's  Recent Labs Lab 04/21/15 0641  INR 0.93   BMET  Recent Labs Lab 04/21/15 0730  NA 144  K 3.3*  CL 105  BUN 17  CREATININE 1.80*  GLUCOSE 237*   Electrolytes No results for input(s): CALCIUM, MG, PHOS in the last 168 hours. Sepsis Markers  Recent Labs Lab 04/21/15 0731  LATICACIDVEN 2.72*   ABG No results for input(s): PHART, PCO2ART, PO2ART in the last 168 hours. Liver Enzymes  Recent Labs Lab 04/21/15 0641  AST 27  ALT 24  ALKPHOS 39  BILITOT 0.5  ALBUMIN 3.9   Cardiac Enzymes No results for input(s): TROPONINI, PROBNP in the last 168 hours. Glucose  Recent Labs Lab 04/21/15 0813  GLUCAP 258*    Imaging Dg Abd 1 View  04/21/2015   CLINICAL DATA:  Drug overdose  EXAM: ABDOMEN - 1 VIEW  COMPARISON:  None.  FINDINGS: NG tube in the stomach. Normal bowel gas pattern. No renal calculi. Bilateral pelvic calcifications most likely phleboliths. No acute bony abnormality.  IMPRESSION: Negative.   Electronically Signed   By: Marlan Palau M.D.   On: 04/21/2015 07:37   Ct Head Wo Contrast  04/21/2015   CLINICAL DATA:  Altered mental status.  Overdose.  EXAM: CT HEAD WITHOUT CONTRAST  TECHNIQUE:  Contiguous axial images were obtained from the base of the skull through the vertex without intravenous contrast.  COMPARISON:  None.  FINDINGS: Ventricle size is normal. Negative for acute or chronic infarction. Negative for hemorrhage or fluid collection. Negative for mass or edema. No shift of the midline structures.  Calvarium is intact. Mucosal edema in the paranasal sinuses without air-fluid level. Pharyngeal edema related to intubation.  IMPRESSION: Normal CT of the head.   Electronically Signed   By: Marlan Palau M.D.   On: 04/21/2015 08:01   Ct Soft Tissue Neck W Contrast  04/21/2015   ADDENDUM REPORT: 04/21/2015 08:37  ADDENDUM: These results were called by telephone at the time of interpretation on 04/21/2015 at 8:36 am to Dr. Effie Shy , who verbally acknowledged these results.   Electronically Signed   By: Marlan Palau M.D.   On: 04/21/2015 08:37   04/21/2015   CLINICAL DATA:  Overdose. Post intubation.  Left neck swelling post intubation.  EXAM: CT NECK WITH CONTRAST  TECHNIQUE: Multidetector CT imaging of the neck was performed using the standard protocol following the bolus administration of intravenous contrast.  CONTRAST:  69mL OMNIPAQUE IOHEXOL 300 MG/ML  SOLN  COMPARISON:  None.  FINDINGS: Pharynx and larynx: The patient is intubated. Endotracheal tube extends into the trachea. The endotracheal tube balloon is inflated just below the larynx. The hyoid bone is mildly displaced to the left. The thyroid cartilage is more significantly displaced to the left raising the possibility of thyrohyoid ligament disruption. Larynx is displaced to the left of midline significantly. Arytenoid cartilage in normal position bilaterally. No gas in the soft tissues. NG tube in the esophagus. Pharyngeal edema is present surrounding the endotracheal tube. There is fluid in the nasopharynx.  Salivary glands: Negative  Thyroid: Thyroid is diffusely abnormal bilaterally with multiple linear areas of low-density  throughout the gland. There is fluid density surrounding the thyroid gland bilaterally. Etiology is not clear but this may be related to trauma from intubation or neck manipulation. Thyroiditis less likely.  Lymph nodes: No adenopathy  Vascular: Carotid artery patent bilaterally without stenosis. Jugular vein patent bilaterally.  Limited intracranial: Negative  Visualized orbits: Negative  Mastoids and visualized paranasal sinuses: Mucosal edema in the ethmoid sinuses. Mastoid sinus clear bilaterally.  Skeleton: Negative  Upper chest: Lung apices clear  IMPRESSION: Endotracheal tube extends into the trachea. NG tube in the esophagus.  The hyoid bone is mildly displaced to the left. There is more significant displacement of the thyroid cartilage to the left. This raises the possibility of thyrohyoid ligament disruption or cartilage injury.  Diffuse edema throughout the thyroid gland with fluid surrounding the thyroid gland bilaterally, question trauma to the thyroid gland.  Electronically Signed: By: Marlan Palau M.D. On: 04/21/2015 08:29   Dg Chest Portable 1 View  04/21/2015   CLINICAL DATA:  Intubation drug overdose  EXAM: PORTABLE CHEST - 1 VIEW  COMPARISON:  04/21/2015  FINDINGS: Endotracheal tube in good position.  NG tube enters the stomach.  Hypoventilation with decreased lung volume compared with earlier study. Mild left lower lobe atelectasis. Negative for pneumonia or effusion  IMPRESSION: Endotracheal tube in good position.  NG tube in the stomach  Mild left lower lobe atelectasis secondary to hypoventilation.   Electronically Signed   By: Marlan Palau M.D.   On: 04/21/2015 07:45   Dg Chest Port 1 View  04/21/2015   CLINICAL DATA:  Altered mental status.  Unresponsive  EXAM: PORTABLE CHEST - 1 VIEW  COMPARISON:  None.  FINDINGS: The heart size and mediastinal contours are within normal limits. Both lungs are clear. The visualized skeletal structures are unremarkable.  IMPRESSION: No active  disease.   Electronically Signed   By: Marlan Palau M.D.   On: 04/21/2015 06:58     ASSESSMENT / PLAN:  NEUROLOGIC/TOXICOLOGY  A:   Acute encephalopathy s/p poly substance overdose  >received charcoal in ED P:   RASS goal: -2 PAD protocol  q2  hr QTc, if > 560 msec will rx w. MgSO4, consider bicarb but we do not believe she ingested tricyclic IV hydration  Watch for high fevers and muscular rigidity, hypertonia or clonus. Would rx Serotonin syndrome w/ NMB, cooling and cyproheptadine.  Repeat apap level   PULMONARY OETT 6/16 >>  A: Ventilator dependence in setting of ineffective airway clearance  Transient Neck swelling: seen by ENT: ? Intubation trauma vs valsalva. Completely resolved. No free air  to suggest esophageal perf or tracheal esoph fistula.   P:   Full vent support F/u abg PAD protocol  3 doses of decadron per ENT   CARDIOVASCULAR  A:  Hypotension/circulatory shock.  Likely in the setting of polysubstance overdose. Does not meet SIRS criteria  P:  Cont IV hydration Will repeat LA Decrease sedation as she can tolerate Will likely need central access for pressors if MAP remains <65 Monitor QTc q 4 hrs given multiple medications she ingested can widen QTc complex Cont tele monitoring   RENAL A:   AKI Hypokalemia  P:   Cont IVFs; change MIVF to LR to avoid hyperchloremia after aggressive saline administration via bolus  Replace K F/u chemistry  Strict I&O  GASTROINTESTINAL A:   Vomiting  P:   Gastric tube to LIWS x 6 hours, then clamp.  PPI for SUP Consider tube feeds if still intubated 6/17  HEMATOLOGIC A:   Normocytic/normochromic anemia  Thrombocytopenia   P:  Trend CBC [place] heparin  Transfuse per ICU protocol   INFECTIOUS A:   No evidence of infection  P:   Send PCT F/u CXR (watch for evidence of aspiration) Send sputum culture  unasyn 6/13 (empiric for possible intubation trauma per ENT rec) >>>  ENDOCRINE A:    Hyperglycemia  P:   ssi   FAMILY  - Updates: pending  - Inter-disciplinary family meet or Palliative Care meeting due by: 6/23    TODAY'S SUMMARY: polysubstance intentional OD. Admit to ICU. Supportive care: ventilation, hydration, pressors for hypotension. Watch Qtc, fever curve, and for s/sy of serotonin syndrome. Poison control notified.   Simonne Martinet ACNP-BC [facility] Pulmonary/Critical Care Pager # [phone] OR # [phone] if no answer 04/21/2015, 9:09 AM   Attending Note:  I have examined patient, reviewed labs, studies and notes. I have discussed the case with [name], and I agree with the data and plans as amended above. Pt presented obtunded after an intentional toxic ingestion. She is hypotensive with widening QT-c. She did receive charcoal in ED. She had neck fullness after intubation, ? Traumatic. A CT neck showed a peri-thyroid injury. On my eval her neck is no longer full, she is sedated on fentanyl, is hypotensive, tolerating MV. We will plan to support hemodynamics, ventilation until MS improves. Will give MgSO4 now, follow QT-c.She is at risk for serotonin syndrome, follow temp and neuro status. Await ENT input regarding her abnormal Ct neck, ? Whether this needs to influence any future plans for extubation.  Independent critical care time is 60 minutes.   Levy Pupa, MD, PhD 04/21/2015, 11:15 AM [facility] [phone] or if no answer [phone]

## 2015-04-21 NOTE — Progress Notes (Signed)
Initial Nutrition Assessment  INTERVENTION:  If pt expected to remain intubated >/= 24 hours, recommend initiation of Vital AF 1.2 @ 20 ml/hr via OGT and increase by 10 ml every 4 hours to goal rate of 50 ml/hr.   Tube feeding regimen provides 1440 kcal (98% of needs), 90 grams of protein, and 972 ml of H2O.   NUTRITION DIAGNOSIS:  Inadequate oral intake related to inability to eat as evidenced by NPO status.  GOAL:  Patient will meet greater than or equal to 90% of their needs  MONITOR:  Vent status, Weight trends, Labs (initiation of TF)  REASON FOR ASSESSMENT:  Ventilator    ASSESSMENT: 44 year old female admitted on 6/16 after intentional polysubstance overdose which included: fluoxetine, hydrocodone, buspar, trazedone, seroquel, bupropion and toprimate. Intubated for airway protection. PCCM asked to admit.   Pt with no fat or muscle depletion Labs and medications reviewed  K 3.2  CBGs 258  Patient is currently intubated on ventilator support MV: 9.9 L/min Temp (24hrs), Avg:96.6 F (35.9 C), Min:95.4 F (35.2 C), Max:97.7 F (36.5 C)  Propofol: none  RD will continue to monitor  Height:  Ht Readings from Last 1 Encounters:  04/21/15 5\' 8"  (1.727 m)    Weight:  Wt Readings from Last 1 Encounters:  04/21/15 153 lb 10.6 oz (69.7 kg)    Ideal Body Weight:  63.6 kg  Wt Readings from Last 10 Encounters:  04/21/15 153 lb 10.6 oz (69.7 kg)  05/15/14 143 lb (64.864 kg)    BMI:  Body mass index is 23.37 kg/(m^2).  Estimated Nutritional Needs:  Kcal:  1465  Protein:  90-100 g  Fluid:  >1.5 L/day  Skin:  Reviewed, no issues  Diet Order:   NPO  EDUCATION NEEDS:   No education needs identified at this time   Intake/Output Summary (Last 24 hours) at 04/21/15 1241 Last data filed at 04/21/15 1055  Gross per 24 hour  Intake      0 ml  Output   1250 ml  Net  -1250 ml    Last BM:  6/16  Emmaline Kluver MS, RD, LDN [phone]

## 2015-04-22 ENCOUNTER — Inpatient Hospital Stay (HOSPITAL_COMMUNITY): Payer: Medicaid Other

## 2015-04-22 DIAGNOSIS — T426X2A Poisoning by other antiepileptic and sedative-hypnotic drugs, intentional self-harm, initial encounter: Secondary | ICD-10-CM

## 2015-04-22 DIAGNOSIS — F1910 Other psychoactive substance abuse, uncomplicated: Secondary | ICD-10-CM | POA: Diagnosis present

## 2015-04-22 DIAGNOSIS — F1019 Alcohol abuse with unspecified alcohol-induced disorder: Secondary | ICD-10-CM

## 2015-04-22 DIAGNOSIS — T43592A Poisoning by other antipsychotics and neuroleptics, intentional self-harm, initial encounter: Secondary | ICD-10-CM

## 2015-04-22 DIAGNOSIS — F0630 Mood disorder due to known physiological condition, unspecified: Secondary | ICD-10-CM

## 2015-04-22 DIAGNOSIS — F1994 Other psychoactive substance use, unspecified with psychoactive substance-induced mood disorder: Secondary | ICD-10-CM

## 2015-04-22 DIAGNOSIS — T1491 Suicide attempt: Secondary | ICD-10-CM

## 2015-04-22 DIAGNOSIS — T43222A Poisoning by selective serotonin reuptake inhibitors, intentional self-harm, initial encounter: Principal | ICD-10-CM

## 2015-04-22 DIAGNOSIS — T43292A Poisoning by other antidepressants, intentional self-harm, initial encounter: Secondary | ICD-10-CM

## 2015-04-22 DIAGNOSIS — T43212A Poisoning by selective serotonin and norepinephrine reuptake inhibitors, intentional self-harm, initial encounter: Secondary | ICD-10-CM

## 2015-04-22 DIAGNOSIS — F10129 Alcohol abuse with intoxication, unspecified: Secondary | ICD-10-CM

## 2015-04-22 HISTORY — DX: Other psychoactive substance use, unspecified with psychoactive substance-induced mood disorder: F19.94

## 2015-04-22 LAB — BLOOD GAS, ARTERIAL
Acid-base deficit: 4.9 mmol/L — ABNORMAL HIGH (ref 0.0–2.0)
Bicarbonate: 17.6 mEq/L — ABNORMAL LOW (ref 20.0–24.0)
FIO2: 0.4 %
MECHVT: 550 mL
O2 Saturation: 99.2 %
PEEP: 5 cmH2O
Patient temperature: 37
RATE: 14 resp/min
TCO2: 16.2 mmol/L (ref 0–100)
pCO2 arterial: 25.3 mmHg — ABNORMAL LOW (ref 35.0–45.0)
pH, Arterial: 7.457 — ABNORMAL HIGH (ref 7.350–7.450)
pO2, Arterial: 173 mmHg — ABNORMAL HIGH (ref 80.0–100.0)

## 2015-04-22 LAB — BASIC METABOLIC PANEL
Anion gap: 6 (ref 5–15)
BUN: 12 mg/dL (ref 6–20)
CO2: 19 mmol/L — ABNORMAL LOW (ref 22–32)
Calcium: 8.5 mg/dL — ABNORMAL LOW (ref 8.9–10.3)
Chloride: 117 mmol/L — ABNORMAL HIGH (ref 101–111)
Creatinine, Ser: 0.74 mg/dL (ref 0.44–1.00)
GFR calc Af Amer: >60 mL/min (ref >60)
GFR calc non Af Amer: >60 mL/min (ref >60)
Glucose, Bld: 161 mg/dL — ABNORMAL HIGH (ref 65–99)
Potassium: 3.8 mmol/L (ref 3.5–5.1)
Sodium: 142 mmol/L (ref 135–145)

## 2015-04-22 LAB — CBC
HCT: 28.6 % — ABNORMAL LOW (ref 36.0–46.0)
Hemoglobin: 9.7 g/dL — ABNORMAL LOW (ref 12.0–15.0)
MCH: 33.2 pg (ref 26.0–34.0)
MCHC: 33.9 g/dL (ref 30.0–36.0)
MCV: 97.9 fL (ref 78.0–100.0)
Platelets: 163 10*3/uL (ref 150–400)
RBC: 2.92 MIL/uL — ABNORMAL LOW (ref 3.87–5.11)
RDW: 15.3 % (ref 11.5–15.5)
WBC: 11.4 10*3/uL — ABNORMAL HIGH (ref 4.0–10.5)

## 2015-04-22 LAB — IRON: Iron: 44 ug/dL (ref 28–170)

## 2015-04-22 LAB — GLUCOSE, CAPILLARY
Glucose-Capillary: 143 mg/dL — ABNORMAL HIGH (ref 65–99)
Glucose-Capillary: 143 mg/dL — ABNORMAL HIGH (ref 65–99)
Glucose-Capillary: 148 mg/dL — ABNORMAL HIGH (ref 65–99)
Glucose-Capillary: 164 mg/dL — ABNORMAL HIGH (ref 65–99)
Glucose-Capillary: 124 mg/dL — ABNORMAL HIGH (ref 65–99)
Glucose-Capillary: 171 mg/dL — ABNORMAL HIGH (ref 65–99)

## 2015-04-22 LAB — PROCALCITONIN: Procalcitonin: <0.1 ng/mL

## 2015-04-22 MED ORDER — LIP MEDEX EX OINT
TOPICAL_OINTMENT | CUTANEOUS | Status: DC | PRN
  Filled 2015-04-22: qty 7

## 2015-04-22 MED ORDER — MORPHINE SULFATE 2 MG/ML IJ SOLN
INTRAMUSCULAR | Status: AC
  Filled 2015-04-22: qty 1

## 2015-04-22 NOTE — Clinical Social Work Psych Assess (Addendum)
Clinical Social Work Programme researcher, broadcasting/film/video Social Worker:  Antony Salmon Date/Time:  04/22/2015, 10:21 AM Referred By:  Physician Date Referred:    Reason for Referral:  Behavioral Health Issues   Presenting Symptoms/Problems  Presenting Symptoms/Problems(in person's/family's own words):  Respondent on vent and unable to assess. CSW called pt mother Alonzo Loving for collateral information to complete assessment. Pt mother shared that she is aware of 3/4 suicide attempts however pt has told her that she has attempted several more times. Pt mother shares that patient has stated, "God must want me around here, since I've haven't been successful." Pt mother states that patient feels she has had lack of success in life, and her first suicide attempt was in high school. Pt son is in custody with father. Pt has history of poor relationships, anxious, frustrated, continued alcohol abuse. Pt mtoher states that patient has spells that go along with her menstrual cycle. Pt mother stated last week patient lied in the bed neked all week, using the matress pad as a sanitary pad. Pt mother states she also will drink until she ens up urinating and defecating on herself. Patient mother shares that she recently got a ticket and has court date later on possibly in [place]. Pt mother states that patient is very secretive. Pt is suppose to be going outpatient treatment however pt mother is unaware if she is going. Pt mother shares that she's been to several inpatient treatment programs for rehab in [facility] and [place]. Pt mother states that she has a boyfriend however has not been coming to see patient for a while, most likely due to this past incident building. Pt mother states last week she was on her menstrual cycle and had these symptoms starting but did not express any SI/HI/Ah/VH to mother.   Pt overdose on fluoexteine, hydrocodone, buspar, trazedone, seroquel, burproprion, topiramate. CSW  to assess patient further once off vent and stable.  Abuse/Neglect/Trauma History  Abuse/Neglect/Trauma History:  Denies History Abuse/Neglect/Trauma History Comments (indicate dates):  N/A   Psychiatric History  Psychiatric History:  Outpatient Treatment, Residential Treatment Psychiatric Medication:     Current Mental Health Hospitalizations/Previous Mental Health History:  Unable to obtain at this time.    Current Provider:   Place and Date:    Current Medications:     Previous Inpatient Admission/Date/Reason:  Substance Abuse Rehab FLA and CLT in years past, no psychiatric inpatient admissions at this time.   Emotional Health/Current Symptoms  Suicide/Self Harm: Suicide Attempt in the Past (date/description) Suicide Attempt in Past (date/description):  Multiple suicide attempts atleast 4.   Other Harmful Behavior (ex. homicidal ideation) (describe):     Psychotic/Dissociative Symptoms  Psychotic/Dissociative Symptoms: Unable to Accurately Assess Other Psychotic/Dissociative Symptoms:     Attention/Behavioral Symptoms  Attention/Behavioral Symptoms: Restless Other Attention/Behavioral Symptoms:     Cognitive Impairment  Cognitive Impairment:  Within Normal Limits Other Cognitive Impairment:     Mood and Adjustment  Mood and Adjustment:  Unable to Accurately Assess   Stress, Anxiety, Trauma, Any Recent Loss/Stressor  Stress, Anxiety, Trauma, Any Recent Loss/Stressor: Current Legal Problems/Pending Court Date Anxiety (frequency):    Phobia (specify):    Compulsive Behavior (specify):    Obsessive Behavior (specify):    Other Stress, Anxiety, Trauma, Any Recent Loss/Stressor:     Substance Abuse/Use  Substance Abuse/Use: History of Substance Use, Substance Abuse Treatment Needed SBIRT Completed (please refer for detailed history): No Self-reported Substance Use (last use and frequency):  Unable to  asess  Urinary Drug Screen Completed:  Yes Alcohol Level:     Environment/Housing/Living Arrangement  Environmental/Housing/Living Arrangement: Stable Housing Who is in the Home:    Emergency Contact:  June Matsuura-mother   Financial  Financial: Medicaid   Patient's Strengths and Goals  Patient's Strengths and Goals (patient's own words):  Unable to assess from patient. Pt mother is strong support when patient is willing to accept assistance    Clinical Social Worker's Interpretive Summary  Clinical Social Workers Interpretive Summary:  Patient recommended for inpatient psychiatric treatment related to suicide attempt, ongoing major depression, possible PMDD.   Disposition  Disposition: Recommend Psych CSW Continuing To Support While In [facility], Inpatient Referral Made [facility], [facility], [place])

## 2015-04-22 NOTE — Progress Notes (Signed)
Nutrition Follow-up  DOCUMENTATION CODES:  Not applicable  INTERVENTION:   None  NUTRITION DIAGNOSIS:  Inadequate oral intake related to inability to eat as evidenced by NPO status.  discontinued  GOAL:  Patient will meet greater than or equal to 90% of their needs  Progressing  MONITOR:  PO intake, Labs, I & O's  REASON FOR ASSESSMENT:  Ventilator    ASSESSMENT: 44 year old female admitted on 6/16 after intentional polysubstance overdose which included: fluoxetine, hydrocodone, buspar, trazedone, seroquel, bupropion and toprimate. Intubated for airway protection. PCCM asked to admit.   Pt was extubated this AM and diet was advanced to Regular this afternoon. Pt reports having a good appetite and eating well PTA. 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 She denies any questions or concerns. She reports eating 75% of lunch.  Labs reviewed.   Height:  Ht Readings from Last 1 Encounters:  04/21/15 5\' 8"  (1.727 m)    Weight:  Wt Readings from Last 1 Encounters:  04/22/15 159 lb 2.8 oz (72.2 kg)    Ideal Body Weight:  63.6 kg  Wt Readings from Last 10 Encounters:  04/22/15 159 lb 2.8 oz (72.2 kg)  05/15/14 143 lb (64.864 kg)    BMI:  Body mass index is 24.21 kg/(m^2).  Estimated Nutritional Needs:  Kcal:  1900-2100  Protein:  75-85 grams  Fluid:  1.9-2.1 L/day  Skin:  Reviewed, no issues  Diet Order:  Diet regular Room service appropriate?: Yes; Fluid consistency:: Thin  EDUCATION NEEDS:  No education needs identified at this time   Intake/Output Summary (Last 24 hours) at 04/22/15 1522 Last data filed at 04/22/15 1416  Gross per 24 hour  Intake 3427.92 ml  Output   2000 ml  Net 1427.92 ml    Last BM:  PTA   Ian Malkin RD, LDN Inpatient Clinical Dietitian Pager: [phone] After Hours Pager: [phone]

## 2015-04-22 NOTE — Progress Notes (Signed)
CSW received consult for behavioral health issues. Pt on psych consult list. CSW to complete assessment with patient and or collateral assessment with patient mother.   Olga Coaster, LCSW  Clinical Social Work  [employer] [phone]

## 2015-04-22 NOTE — Progress Notes (Signed)
PULMONARY / CRITICAL CARE MEDICINE   Name: Kelsey [name] MRN: [id number] DOB: [birth year]    ADMISSION DATE:  04/21/2015 CONSULTATION DATE:  6/16  REFERRING MD :  Kelsey [name]  CHIEF COMPLAINT:  Overdose; acute encephalopathy and inability to protect airway   INITIAL PRESENTATION:  44 year old female admitted on 6/16 after intentional polysubstance overdose which included: fluoxetine, hydrocodone, buspar, trazedone, seroquel, bupropion and toprimate. Intubated for airway protection. PCCM asked to admit.    STUDIES:  CT neck 6/16: The hyoid bone is mildly displaced to the left. There is more significant displacement of the thyroid cartilage to the left. This raises the possibility of thyrohyoid ligament disruption or cartilage injury.Diffuse edema throughout the thyroid gland with fluid surrounding the thyroid gland bilaterally, question trauma to the thyroid gland.  SIGNIFICANT EVENTS: 6/17 fully awake and appropriate. Passed SBT-->extubated. Psych called    VITAL SIGNS: Temp:  [96.3 F (35.7 C)-99.5 F (37.5 C)] 98.8 F (37.1 C) (06/17 0808) Pulse Rate:  [54-92] 59 (06/17 0808) Resp:  [0-30] 19 (06/17 0808) BP: (61-113)/(31-78) 113/71 mmHg (06/17 0808) SpO2:  [100 %] 100 % (06/17 0808) FiO2 (%):  [40 %-100 %] 40 % (06/17 0808) Weight:  [65.772 kg (145 lb)-72.2 kg (159 lb 2.8 oz)] 72.2 kg (159 lb 2.8 oz) (06/17 0400) HEMODYNAMICS:   VENTILATOR SETTINGS: Vent Mode:  [-] PRVC FiO2 (%):  [40 %-100 %] 40 % Set Rate:  [14 bmp] 14 bmp Vt Set:  [550 mL] 550 mL PEEP:  [5 cmH20] 5 cmH20 Plateau Pressure:  [11 cmH20-17 cmH20] 11 cmH20 INTAKE / OUTPUT:  Intake/Output Summary (Last 24 hours) at 04/22/15 0845 Last data filed at 04/22/15 0820  Gross per 24 hour  Intake 3689.96 ml  Output   3300 ml  Net 389.96 ml    PHYSICAL EXAMINATION: General: 44 year old female, well nourished. Appears her age, awake and cooperative prior to extubation  Neuro:  Awake, oriented. No focal def   HEENT:  Orally intubated. Trachea midline. No Crepitus or swelling noted.  Cardiovascular:  rrr Lungs:  Clear  Abdomen:  Soft, non-tender No OM, + bowel sounds  Musculoskeletal:  Intact  Skin:  Dry, warm, intact, no edema   LABS:  CBC  Recent Labs Lab 04/21/15 0641 04/21/15 0730 04/22/15 0500  WBC 5.0  --  11.4*  HGB 10.5* 10.5* 9.7*  HCT 31.8* 31.0* 28.6*  PLT 146*  --  163   Coag's  Recent Labs Lab 04/21/15 0641 04/21/15 0947  INR 0.93 1.00   BMET  Recent Labs Lab 04/21/15 0947 04/21/15 2015 04/22/15 0335  NA 144 143 142  K 3.2* 4.0 3.8  CL 109 116* 117*  CO2 26 18* 19*  BUN CREATININE 1.08* 0.80 0.74  GLUCOSE 167* 151* 161*   Electrolytes  Recent Labs Lab 04/21/15 0947 04/21/15 2015 04/22/15 0335  CALCIUM 8.7* 8.7* 8.5*   Sepsis Markers  Recent Labs Lab 04/21/15 0731 04/21/15 0947 04/22/15 0335  LATICACIDVEN 2.72* 2.7*  --   PROCALCITON  --  <0.10 <0.10   ABG  Recent Labs Lab 04/21/15 1130 04/22/15 0425  PHART 7.384 7.457*  PCO2ART 38.0 25.3*  PO2ART 542* 173*   Liver Enzymes  Recent Labs Lab 04/21/15 0641 04/21/15 0947  AST 27 25  ALT 24 23  ALKPHOS 39 38  BILITOT 0.5 1.0  ALBUMIN 3.9 3.5   Cardiac Enzymes No results for input(s): TROPONINI, PROBNP in the last 168 hours. Glucose  Recent Labs  Lab 04/21/15 0813 04/21/15 1318 04/21/15 1625 04/21/15 2047 04/22/15 0327  GLUCAP 258* 147* 150* 164* 143*    Imaging Dg Chest Port 1 View  04/22/2015   CLINICAL DATA:  Intubation.  EXAM: PORTABLE CHEST - 1 VIEW  COMPARISON:  04/21/2015.  FINDINGS: Endotracheal tube and NG tube in stable position. Mediastinum hilar structures are normal. Lungs are clear. Heart size normal. No pleural effusion or pneumothorax.  IMPRESSION: 1. Lines and tubes in stable position. 2. No acute cardiopulmonary disease.   Electronically Signed   By: Kelsey Fus  [name]   On: 04/22/2015 07:05     ASSESSMENT /  PLAN:  NEUROLOGIC/TOXICOLOGY  A:   Acute encephalopathy s/p poly substance overdose  >received charcoal in ED >now fully awake. QTc stable over night.  P:   Repeat QTc q shift x 24 more hours Ask psych to see D/c all sedation Continue suicide precautions  PULMONARY OETT 6/16 >> 6/17 A: Ventilator dependence in setting of ineffective airway clearance  Transient Neck swelling: seen by ENT: ? Intubation trauma vs valsalva. Completely resolved. No free air to suggest esophageal perf or tracheal esoph fistula.  -completed 3 doses decadron per ENT -passed SBT P:   Extubation protocol Mobilize  Wean O2 as needed  CARDIOVASCULAR  A:  Hypotension/circulatory shock.  Likely in the setting of polysubstance overdose. Does not meet SIRS criteria  >resolved and is hemodynamically stable  P:  Cont IV hydration Cont tele x 24 more hours   RENAL A:   AKI-->resolved NAG acidosis in setting of massive NS resuscitation and resultant hyperchloremia -->improving  P:   Cont D5LR F/u am chemistry   GASTROINTESTINAL A:   Vomiting -->resolved  P:   Adv diet   HEMATOLOGIC A:   Normocytic/normochromic anemia  Thrombocytopenia   P:  Trend CBC [place] heparin  Transfuse per ICU protocol   INFECTIOUS A:   No evidence of infection -->PCT neg CXR clear  P:   unasyn 6/13 (empiric for possible intubation trauma per ENT rec) >>>6/14 (stopped)  ENDOCRINE A:   Hyperglycemia  P:   ssi   FAMILY  - Updates: pending  - Inter-disciplinary family meet or Palliative Care meeting due by: 6/23    TODAY'S SUMMARY: polysubstance intentional OD. Stable. Looks good. Extubate this am. Adv diet, call psych, cont D5LR for hyperchloremia, follow QTc another 24hrs.    Kelsey [name] ACNP-BC [facility] Pulmonary/Critical Care Pager # [phone] OR # [phone] if no answer 04/22/2015, 8:45 AM   Attending Note:  I have examined patient, reviewed labs, studies and notes. I have discussed the case  with Kelsey [name], and I agree with the data and plans as amended above. Polysubstance OD, intentional toxic ingestion resulting in obtundation, resp failure, prolonged QT-c, hypotension. She is significantly improved, passed SBT on eval today. No evidence of neck injury or subcut air on exam. Plan is extubation, will follow QT-c for another day. Psych consultation today. Independent critical care time is 45 minutes.   Levy Pupa, MD, PhD 04/22/2015, 10:07 AM [place] [facility] [phone] or if no answer [phone]

## 2015-04-22 NOTE — Progress Notes (Signed)
13ml of Fentanyl wasted in sink. Witnessed by Lezlie Lye, RN. Unused bag of fentanyl given to pharmacist.

## 2015-04-22 NOTE — Consult Note (Signed)
[facility] Consult   Reason for Consult:  Alcohol intoxication and polysubstance abuse Referring Physician:  Dr. Lamonte Sakai Patient Identification: ONA ROEHRS MRN:  [id number] Principal Diagnosis: Psychoactive substance-induced mood disorder Diagnosis:   Patient Active Problem List   Diagnosis Date Noted  . Polysubstance abuse [F19.10] 04/22/2015  . 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 Overdose [T50.901A] 04/21/2015  . Altered mental status [R41.82]   . GAD (generalized anxiety disorder) [F41.1] 05/18/2014  . Substance induced mood disorder [F19.94] 05/18/2014  . Alcohol dependence [F10.20] 05/15/2014    Total Time spent with patient: 1 hour  Subjective:   Kelsey [name] is a 44 y.o. female patient admitted with intentional overdose.  HPI:  Kelsey [name] is a 44 years old female admitted to [facility] long intensive care unit with alcohol intoxication and also reportedly intentional overdose of multiple medications including fluoxetine, BuSpar, trazodone, Seroquel, Wellbutrin and Topamax. Patient reported she was drunk with one bottle of wine and then overdosed to get attention from her boyfriend. Patient is currently drowsy, tired but able to verbally communicate during this assessment. Patient reported she had multiple DWIs about 5 and 1 of them is pending. Patient reportedly taper off her drinking is so that she won't have any withdrawal symptoms, denied alcohol withdrawal seizures, delirium tremens and blackouts. Patient also reportedly has the mandate any chemical dependency rehabilitation treatment pending. Patient reported she started drinking since age 63 and relapsed drinking about 3 weeks ago. Patient denies current symptoms are depression, anxieties, suicidal or homicidal ideation and has no evidence of psychosis. Patient has complaints about feeling tired, drowsy custody and hungry but no nausea, vomiting, chest pain,  shortness of breath. Patient has previous acute psychiatric hospitalization for alcohol detox treatment at behavioral [facility] in 05/19/2014. Reportedly patient stays with her boyfriend and has one 69 years old son who lives with his father. Patient reportedly works in [employer]. Patient expressed her interest in inpatient hospitalization and also chemical dependency rehabilitation treatment when medically and psychiatrically stable.   Medical history: 44 year old female presented to ED after intentional overdose w/ fluoxetine, hydrocodone, buspar, trazedone, seroquel, bupropion and toprimate. Quantity of each unknown. On arrival to ER was drowsy. This got progressively worse to point where she became completely unresponsive and hypotensive. She was intubated for airway protection. Intubation noted to be difficult and esophagus was initially intubated. Pt vomited during procedure. Then successfully intubated w/ no report of hypoxia. Post-intubation ER team noted neck swelling of unclear etiology. CT neck obtained. This was negative for free air. ENT was called to eval. Ultimately PCCM asked to admit.    HPI Elements:   Location:  Alcohol intoxication. Quality:  Poor. Severity:  Status post intentional overdose while intoxicated. Timing:  Unknown stresses. Duration:  3 weeks. Context:  Unknown psychosocial stresses, relationship problems and relapsed on alcohol abuse versus dependence..  Past Medical History:  Past Medical History  Diagnosis Date  . Alcohol abuse   . Depression   . Suicide attempt     Past Surgical History  Procedure Laterality Date  . Cosmetic surgery     Family History: No family history on file. Social History:  History  Alcohol Use  . 18.0 oz/week  . 30 Glasses of wine per week     History  Drug Use No    History   Social History  . Marital Status: Single    Spouse Name: N/A  . Number of Children: N/A  .  Years of Education: N/A   Social  History Main Topics  . Smoking status: Never Smoker   . Smokeless tobacco: Not on file  . Alcohol Use: 18.0 oz/week    30 Glasses of wine per week  . Drug Use: No  . Sexual Activity: No   Other Topics Concern  . None   Social History Narrative   Additional Social History:                          Allergies:  No Known Allergies  Labs:  Results for orders placed or performed during the hospital encounter of 04/21/15 (from the past 48 hour(s))  CBC with Differential/Platelet     Status: Abnormal   Collection Time: 04/21/15  6:41 AM  Result Value Ref Range   WBC 5.0 4.0 - 10.5 K/uL   RBC 3.28 (L) 3.87 - 5.11 MIL/uL   Hemoglobin 10.5 (L) 12.0 - 15.0 g/dL   HCT 31.8 (L) 36.0 - 46.0 %   MCV 97.0 78.0 - 100.0 fL   MCH 32.0 26.0 - 34.0 pg   MCHC 33.0 30.0 - 36.0 g/dL   RDW 14.6 11.5 - 15.5 %   Platelets 146 (L) 150 - 400 K/uL   Neutrophils Relative % 53 43 - 77 %   Neutro Abs 2.7 1.7 - 7.7 K/uL   Lymphocytes Relative 35 12 - 46 %   Lymphs Abs 1.7 0.7 - 4.0 K/uL   Monocytes Relative 10 3 - 12 %   Monocytes Absolute 0.5 0.1 - 1.0 K/uL   Eosinophils Relative 2 0 - 5 %   Eosinophils Absolute 0.1 0.0 - 0.7 K/uL   Basophils Relative 0 0 - 1 %   Basophils Absolute 0.0 0.0 - 0.1 K/uL  Lipase, blood     Status: Abnormal   Collection Time: 04/21/15  6:41 AM  Result Value Ref Range   Lipase 55 (H) 22 - 51 U/L  Protime-INR     Status: None   Collection Time: 04/21/15  6:41 AM  Result Value Ref Range   Prothrombin Time 12.7 11.6 - 15.2 seconds   INR 0.93 0.00 - 1.49  Hepatic function panel     Status: Abnormal   Collection Time: 04/21/15  6:41 AM  Result Value Ref Range   Total Protein 6.9 6.5 - 8.1 g/dL   Albumin 3.9 3.5 - 5.0 g/dL   AST 27 15 - 41 U/L   ALT 24 14 - 54 U/L   Alkaline Phosphatase 39 38 - 126 U/L   Total Bilirubin 0.5 0.3 - 1.2 mg/dL   Bilirubin, Direct <0.1 (L) 0.1 - 0.5 mg/dL   Indirect Bilirubin NOT CALCULATED 0.3 - 0.9 mg/dL  Ethanol     Status:  Abnormal   Collection Time: 04/21/15  6:51 AM  Result Value Ref Range   Alcohol, Ethyl (B) 361 (HH) <5 mg/dL    Comment:        LOWEST DETECTABLE LIMIT FOR SERUM ALCOHOL IS 5 mg/dL FOR MEDICAL PURPOSES ONLY CRITICAL RESULT CALLED TO, READ BACK BY AND VERIFIED WITH: HARRIS,B. RN '@[id number]'  ON 6.16.16 BY MCCOY,N.   Salicylate level     Status: None   Collection Time: 04/21/15  6:51 AM  Result Value Ref Range   Salicylate Lvl <2.5 2.8 - 30.0 mg/dL  Acetaminophen level     Status: Abnormal   Collection Time: 04/21/15  6:51 AM  Result Value Ref Range   Acetaminophen (Tylenol),  Serum <10 (L) 10 - 30 ug/mL    Comment:        THERAPEUTIC CONCENTRATIONS VARY SIGNIFICANTLY. A RANGE OF 10-30 ug/mL MAY BE AN EFFECTIVE CONCENTRATION FOR MANY PATIENTS. HOWEVER, SOME ARE BEST TREATED AT CONCENTRATIONS OUTSIDE THIS RANGE. ACETAMINOPHEN CONCENTRATIONS >150 ug/mL AT 4 HOURS AFTER INGESTION AND >50 ug/mL AT 12 HOURS AFTER INGESTION ARE OFTEN ASSOCIATED WITH TOXIC REACTIONS.   TSH     Status: None   Collection Time: 04/21/15  7:24 AM  Result Value Ref Range   TSH 1.673 0.350 - 4.500 uIU/mL  T4, free     Status: None   Collection Time: 04/21/15  7:24 AM  Result Value Ref Range   Free T4 0.96 0.61 - 1.12 ng/dL    Comment: Performed at [facility], ed     Status: Abnormal   Collection Time: 04/21/15  7:30 AM  Result Value Ref Range   Sodium 144 135 - 145 mmol/L   Potassium 3.3 (L) 3.5 - 5.1 mmol/L   Chloride 105 101 - 111 mmol/L   BUN 17 6 - 20 mg/dL   Creatinine, Ser 1.80 (H) 0.44 - 1.00 mg/dL   Glucose, Bld 237 (H) 65 - 99 mg/dL   Calcium, Ion 1.25 (H) 1.12 - 1.23 mmol/L   TCO2 24 0 - 100 mmol/L   Hemoglobin 10.5 (L) 12.0 - 15.0 g/dL   HCT 31.0 (L) 36.0 - 46.0 %  I-Stat CG4 Lactic Acid, ED     Status: Abnormal   Collection Time: 04/21/15  7:31 AM  Result Value Ref Range   Lactic Acid, Venous 2.72 (HH) 0.5 - 2.0 mmol/L   Comment NOTIFIED PHYSICIAN    Urinalysis, Routine w reflex microscopic (not at [facility])     Status: Abnormal   Collection Time: 04/21/15  8:08 AM  Result Value Ref Range   Color, Urine YELLOW YELLOW   APPearance CLOUDY (A) CLEAR   Specific Gravity, Urine 1.012 1.005 - 1.030   pH 8.0 5.0 - 8.0   Glucose, UA 100 (A) NEGATIVE mg/dL   Hgb urine dipstick NEGATIVE NEGATIVE   Bilirubin Urine NEGATIVE NEGATIVE   Ketones, ur NEGATIVE NEGATIVE mg/dL   Protein, ur NEGATIVE NEGATIVE mg/dL   Urobilinogen, UA 0.2 0.0 - 1.0 mg/dL   Nitrite NEGATIVE NEGATIVE   Leukocytes, UA NEGATIVE NEGATIVE    Comment: MICROSCOPIC NOT DONE ON URINES WITH NEGATIVE PROTEIN, BLOOD, LEUKOCYTES, NITRITE, OR GLUCOSE <1000 mg/dL.  Urine rapid drug screen (hosp performed)     Status: None   Collection Time: 04/21/15  8:08 AM  Result Value Ref Range   Opiates NONE DETECTED NONE DETECTED   Cocaine NONE DETECTED NONE DETECTED   Benzodiazepines NONE DETECTED NONE DETECTED   Amphetamines NONE DETECTED NONE DETECTED   Tetrahydrocannabinol NONE DETECTED NONE DETECTED   Barbiturates NONE DETECTED NONE DETECTED    Comment:        DRUG SCREEN FOR MEDICAL PURPOSES ONLY.  IF CONFIRMATION IS NEEDED FOR ANY PURPOSE, NOTIFY LAB WITHIN 5 DAYS.        LOWEST DETECTABLE LIMITS FOR URINE DRUG SCREEN Drug Class       Cutoff (ng/mL) Amphetamine      1000 Barbiturate      200 Benzodiazepine   962 Tricyclics       836 Opiates          300 Cocaine          300 THC  50   CBG monitoring, ED     Status: Abnormal   Collection Time: 04/21/15  8:13 AM  Result Value Ref Range   Glucose-Capillary 258 (H) 65 - 99 mg/dL  POC urine preg, ED (not at [facility])     Status: None   Collection Time: 04/21/15  8:18 AM  Result Value Ref Range   Preg Test, Ur NEGATIVE NEGATIVE    Comment:        THE SENSITIVITY OF THIS METHODOLOGY IS >24 mIU/mL   Comprehensive metabolic panel     Status: Abnormal   Collection Time: 04/21/15  9:47 AM  Result Value Ref Range   Sodium  144 135 - 145 mmol/L   Potassium 3.2 (L) 3.5 - 5.1 mmol/L   Chloride 109 101 - 111 mmol/L   CO2 26 22 - 32 mmol/L   Glucose, Bld 167 (H) 65 - 99 mg/dL   BUN 17 6 - 20 mg/dL   Creatinine, Ser 1.08 (H) 0.44 - 1.00 mg/dL   Calcium 8.7 (L) 8.9 - 10.3 mg/dL   Total Protein 5.9 (L) 6.5 - 8.1 g/dL   Albumin 3.5 3.5 - 5.0 g/dL   AST 25 15 - 41 U/L   ALT 23 14 - 54 U/L   Alkaline Phosphatase 38 38 - 126 U/L   Total Bilirubin 1.0 0.3 - 1.2 mg/dL   GFR calc non Af Amer >60 >60 mL/min   GFR calc Af Amer >60 >60 mL/min    Comment: (NOTE) The eGFR has been calculated using the CKD EPI equation. This calculation has not been validated in all clinical situations. eGFR's persistently <60 mL/min signify possible Chronic Kidney Disease.    Anion gap 9 5 - 15  Lactic acid, plasma     Status: Abnormal   Collection Time: 04/21/15  9:47 AM  Result Value Ref Range   Lactic Acid, Venous 2.7 (HH) 0.5 - 2.0 mmol/L    Comment: REPEATED TO VERIFY CRITICAL RESULT CALLED TO, READ BACK BY AND VERIFIED WITH: ASHLEY,A. RN AT 1101 04/21/15 MULLINS,T   Protime-INR     Status: None   Collection Time: 04/21/15  9:47 AM  Result Value Ref Range   Prothrombin Time 13.4 11.6 - 15.2 seconds   INR 1.00 0.00 - 1.49  Procalcitonin - Baseline     Status: None   Collection Time: 04/21/15  9:47 AM  Result Value Ref Range   Procalcitonin <0.10 ng/mL    Comment:        Interpretation: PCT (Procalcitonin) <= 0.5 ng/mL: Systemic infection (sepsis) is not likely. Local bacterial infection is possible. (NOTE)         ICU PCT Algorithm               Non ICU PCT Algorithm    ----------------------------     ------------------------------         PCT < 0.25 ng/mL                 PCT < 0.1 ng/mL     Stopping of antibiotics            Stopping of antibiotics       strongly encouraged.               strongly encouraged.    ----------------------------     ------------------------------       PCT level decrease by                PCT < 0.25  ng/mL       >= 80% from peak PCT       OR PCT 0.25 - 0.5 ng/mL          Stopping of antibiotics                                             encouraged.     Stopping of antibiotics           encouraged.    ----------------------------     ------------------------------       PCT level decrease by              PCT >= 0.25 ng/mL       < 80% from peak PCT        AND PCT >= 0.5 ng/mL            Continuin g antibiotics                                              encouraged.       Continuing antibiotics            encouraged.    ----------------------------     ------------------------------     PCT level increase compared          PCT > 0.5 ng/mL         with peak PCT AND          PCT >= 0.5 ng/mL             Escalation of antibiotics                                          strongly encouraged.      Escalation of antibiotics        strongly encouraged.   Acetaminophen level     Status: Abnormal   Collection Time: 04/21/15  9:47 AM  Result Value Ref Range   Acetaminophen (Tylenol), Serum <10 (L) 10 - 30 ug/mL    Comment:        THERAPEUTIC CONCENTRATIONS VARY SIGNIFICANTLY. A RANGE OF 10-30 ug/mL MAY BE AN EFFECTIVE CONCENTRATION FOR MANY PATIENTS. HOWEVER, SOME ARE BEST TREATED AT CONCENTRATIONS OUTSIDE THIS RANGE. ACETAMINOPHEN CONCENTRATIONS >150 ug/mL AT 4 HOURS AFTER INGESTION AND >50 ug/mL AT 12 HOURS AFTER INGESTION ARE OFTEN ASSOCIATED WITH TOXIC REACTIONS.   MRSA PCR Screening     Status: None   Collection Time: 04/21/15 11:14 AM  Result Value Ref Range   MRSA by PCR NEGATIVE NEGATIVE    Comment:        The GeneXpert MRSA Assay (FDA approved for NASAL specimens only), is one component of a comprehensive MRSA colonization surveillance program. It is not intended to diagnose MRSA infection nor to guide or monitor treatment for MRSA infections.   Blood gas, arterial     Status: Abnormal   Collection Time: 04/21/15 11:30 AM  Result Value Ref  Range   FIO2 1.00 %   Delivery systems VENTILATOR    Mode PRESSURE REGULATED VOLUME CONTROL    VT 550 mL   Rate 14 resp/min   Peep/cpap 5.0 cm H20  pH, Arterial 7.384 7.350 - 7.450   pCO2 arterial 38.0 35.0 - 45.0 mmHg   pO2, Arterial 542 (H) 80.0 - 100.0 mmHg   Bicarbonate 22.2 20.0 - 24.0 mEq/L   TCO2 20.7 0 - 100 mmol/L   Acid-base deficit 2.0 0.0 - 2.0 mmol/L   O2 Saturation 99.7 %   Patient temperature 37.0    Collection site BRACHIAL ARTERY    Drawn by [id number]    Sample type ARTERIAL   Glucose, capillary     Status: Abnormal   Collection Time: 04/21/15  1:18 PM  Result Value Ref Range   Glucose-Capillary 147 (H) 65 - 99 mg/dL  Culture, respiratory (tracheal aspirate)     Status: None (Preliminary result)   Collection Time: 04/21/15  2:00 PM  Result Value Ref Range   Specimen Description TRACHEAL ASPIRATE    Special Requests NONE    Gram Stain      FEW WBC PRESENT,BOTH PMN AND MONONUCLEAR NO SQUAMOUS EPITHELIAL CELLS SEEN NO ORGANISMS SEEN Performed at [facility]    Culture NO GROWTH Performed at [facility]     Report Status PENDING   Glucose, capillary     Status: Abnormal   Collection Time: 04/21/15  4:25 PM  Result Value Ref Range   Glucose-Capillary 150 (H) 65 - 99 mg/dL  Iron     Status: None   Collection Time: 04/21/15  8:15 PM  Result Value Ref Range   Iron 44 28 - 170 ug/dL    Comment: Performed at [facility] panel     Status: Abnormal   Collection Time: 04/21/15  8:15 PM  Result Value Ref Range   Sodium 143 135 - 145 mmol/L   Potassium 4.0 3.5 - 5.1 mmol/L    Comment: DELTA CHECK NOTED REPEATED TO VERIFY NO VISIBLE HEMOLYSIS    Chloride 116 (H) 101 - 111 mmol/L   CO2 18 (L) 22 - 32 mmol/L   Glucose, Bld 151 (H) 65 - 99 mg/dL   BUN 12 6 - 20 mg/dL   Creatinine, Ser 0.80 0.44 - 1.00 mg/dL   Calcium 8.7 (L) 8.9 - 10.3 mg/dL   GFR calc non Af Amer >60 >60 mL/min   GFR calc Af Amer >60 >60 mL/min     Comment: (NOTE) The eGFR has been calculated using the CKD EPI equation. This calculation has not been validated in all clinical situations. eGFR's persistently <60 mL/min signify possible Chronic Kidney Disease.    Anion gap 9 5 - 15  Glucose, capillary     Status: Abnormal   Collection Time: 04/21/15  8:47 PM  Result Value Ref Range   Glucose-Capillary 164 (H) 65 - 99 mg/dL  Glucose, capillary     Status: Abnormal   Collection Time: 04/22/15  3:27 AM  Result Value Ref Range   Glucose-Capillary 143 (H) 65 - 99 mg/dL  Basic metabolic panel     Status: Abnormal   Collection Time: 04/22/15  3:35 AM  Result Value Ref Range   Sodium 142 135 - 145 mmol/L   Potassium 3.8 3.5 - 5.1 mmol/L   Chloride 117 (H) 101 - 111 mmol/L   CO2 19 (L) 22 - 32 mmol/L   Glucose, Bld 161 (H) 65 - 99 mg/dL   BUN 12 6 - 20 mg/dL   Creatinine, Ser 0.74 0.44 - 1.00 mg/dL   Calcium 8.5 (L) 8.9 - 10.3 mg/dL   GFR calc non Af Amer >60 >  60 mL/min   GFR calc Af Amer >60 >60 mL/min    Comment: (NOTE) The eGFR has been calculated using the CKD EPI equation. This calculation has not been validated in all clinical situations. eGFR's persistently <60 mL/min signify possible Chronic Kidney Disease.    Anion gap 6 5 - 15  Procalcitonin     Status: None   Collection Time: 04/22/15  3:35 AM  Result Value Ref Range   Procalcitonin <0.10 ng/mL    Comment:        Interpretation: PCT (Procalcitonin) <= 0.5 ng/mL: Systemic infection (sepsis) is not likely. Local bacterial infection is possible. (NOTE)         ICU PCT Algorithm               Non ICU PCT Algorithm    ----------------------------     ------------------------------         PCT < 0.25 ng/mL                 PCT < 0.1 ng/mL     Stopping of antibiotics            Stopping of antibiotics       strongly encouraged.               strongly encouraged.    ----------------------------     ------------------------------       PCT level decrease by                PCT < 0.25 ng/mL       >= 80% from peak PCT       OR PCT 0.25 - 0.5 ng/mL          Stopping of antibiotics                                             encouraged.     Stopping of antibiotics           encouraged.    ----------------------------     ------------------------------       PCT level decrease by              PCT >= 0.25 ng/mL       < 80% from peak PCT        AND PCT >= 0.5 ng/mL            Continuin g antibiotics                                              encouraged.       Continuing antibiotics            encouraged.    ----------------------------     ------------------------------     PCT level increase compared          PCT > 0.5 ng/mL         with peak PCT AND          PCT >= 0.5 ng/mL             Escalation of antibiotics  strongly encouraged.      Escalation of antibiotics        strongly encouraged.   Blood gas, arterial     Status: Abnormal   Collection Time: 04/22/15  4:25 AM  Result Value Ref Range   FIO2 0.40 %   Delivery systems VENTILATOR    Mode PRESSURE REGULATED VOLUME CONTROL    VT 550 mL   Rate 14 resp/min   Peep/cpap 5.0 cm H20   pH, Arterial 7.457 (H) 7.350 - 7.450   pCO2 arterial 25.3 (L) 35.0 - 45.0 mmHg   pO2, Arterial 173 (H) 80.0 - 100.0 mmHg   Bicarbonate 17.6 (L) 20.0 - 24.0 mEq/L   TCO2 16.2 0 - 100 mmol/L   Acid-base deficit 4.9 (H) 0.0 - 2.0 mmol/L   O2 Saturation 99.2 %   Patient temperature 37.0    Collection site RIGHT RADIAL    Drawn by COLLECTED BY RT    Sample type ARTERIAL DRAW    Allens test (pass/fail) PASS PASS  CBC     Status: Abnormal   Collection Time: 04/22/15  5:00 AM  Result Value Ref Range   WBC 11.4 (H) 4.0 - 10.5 K/uL   RBC 2.92 (L) 3.87 - 5.11 MIL/uL   Hemoglobin 9.7 (L) 12.0 - 15.0 g/dL   HCT 28.6 (L) 36.0 - 46.0 %   MCV 97.9 78.0 - 100.0 fL   MCH 33.2 26.0 - 34.0 pg   MCHC 33.9 30.0 - 36.0 g/dL   RDW 15.3 11.5 - 15.5 %   Platelets 163 150 - 400 K/uL     Vitals: Blood pressure 124/58, pulse 83, temperature 98.4 F (36.9 C), temperature source Core (Comment), resp. rate 10, height '5\' 8"'  (1.727 m), weight 72.2 kg (159 lb 2.8 oz), last menstrual period 03/06/2015, SpO2 99 %.  Risk to Self: Is patient at risk for suicide?: Yes Risk to Others:   Prior Inpatient Therapy:   Prior Outpatient Therapy:    Current Facility-Administered Medications  Medication Dose Route Frequency Provider Last Rate Last Dose  . 0.9 %  sodium chloride infusion  250 mL Intravenous PRN Erick Colace, NP      . antiseptic oral rinse (CPC / CETYLPYRIDINIUM CHLORIDE 0.05%) solution 7 mL  7 mL Mouth Rinse QID Collene Gobble, MD   7 mL at 04/22/15 0400  . dextrose 5 % in lactated ringers infusion   Intravenous Continuous Erick Colace, NP 125 mL/hr at 04/21/15 1108 125 mL/hr at 04/21/15 1108  . heparin injection 5,000 Units  5,000 Units Subcutaneous 3 times per day Erick Colace, NP   5,000 Units at 04/22/15 0500  . insulin aspart (novoLOG) injection 0-9 Units  0-9 Units Subcutaneous 6 times per day Erick Colace, NP   2 Units at 04/22/15 0820    Musculoskeletal: Strength & Muscle Tone: decreased Gait & Station: unable to stand Patient leans: N/A  Psychiatric Specialty Exam: Physical Exam as per history and physical   ROS drowsy, tired, thirsty and hungry but denied nausea, vomiting, chest pain, shortness of breath. No Fever-chills, No Headache, No changes with Vision or hearing, reports vertigo No problems swallowing food or Liquids, No Chest pain, Cough or Shortness of Breath, No Abdominal pain, No Nausea or Vommitting, Bowel movements are regular, No Blood in stool or Urine, No dysuria, No new skin rashes or bruises, No new joints pains-aches,  No new weakness, tingling, numbness in any extremity, No recent weight gain or loss, No  polyuria, polydypsia or polyphagia,   A full 10 point Review of Systems was done, except as stated above, all other  Review of Systems were negative.  Blood pressure 124/58, pulse 83, temperature 98.4 F (36.9 C), temperature source Core (Comment), resp. rate 10, height '5\' 8"'  (1.727 m), weight 72.2 kg (159 lb 2.8 oz), last menstrual period 03/06/2015, SpO2 99 %.Body mass index is 24.21 kg/(m^2).  General Appearance: Guarded  Eye Contact::  Fair  Speech:  Clear and Coherent and Slow  Volume:  Decreased  Mood:  Depressed  Affect:  Constricted and Depressed  Thought Process:  Coherent and Goal Directed  Orientation:  Full (Time, Place, and Person)  Thought Content:  Rumination  Suicidal Thoughts:  No  Homicidal Thoughts:  No  Memory:  Immediate;   Fair Recent;   Fair  Judgement:  Impaired  Insight:  Fair  Psychomotor Activity:  Decreased  Concentration:  Poor  Recall:  [employer] of Knowledge:Fair  Language: Good  Akathisia:  Negative  Handed:  Right  AIMS (if indicated):     Assets:  Communication Skills Desire for Improvement Financial Resources/Insurance Housing Leisure Time Physical Health Resilience Social Support Talents/Skills Transportation  ADL's:  Impaired  Cognition: Impaired,  Mild  Sleep:      Medical Decision Making: Review of Psycho-Social Stressors (1), Review or order clinical lab tests (1), Decision to obtain old records (1), Established Problem, Worsening (2), Review of Last Therapy Session (1), Review or order medicine tests (1), Review of Medication Regimen & Side Effects (2) and Review of New Medication or Change in Dosage (2)  Treatment Plan Summary: Patient presented with alcohol intoxication and also status post intentional overdose reportedly to seek attention from her boyfriend. Patient denied suicidal intention, homicidal intention and psychotic symptoms. Patient also has multiple legal problems associated with drinking Alcohol. Daily contact with patient to assess and evaluate symptoms and progress in treatment and Medication management  Plan: Safety concerns:  Chief Technology Officer, patient will be considered suicidal until further evaluation when she becomes more stable without intoxication or withdrawal symptoms Alcohol intoxication and/withdrawal symptoms: Ativan alcohol detox protocol and CIWA monitoring Discontinue antidepression medication secondary to intentional overdose while intoxicated prior to admission Patient does not meet criteria for psychiatric inpatient admission. Supportive therapy provided about ongoing stressors. Refer to IOP.  Appreciate psychiatric consultation and follow up as clinically required Please contact [phone] or [phone] if needs further assistance  Disposition: Patient will be referred to the residential substance abuse rehabilitation treatment plan completed alcohol detox treatment and medically cleared  JONNALAGADDA,JANARDHAHA R. 04/22/2015 11:47 AM

## 2015-04-22 NOTE — Progress Notes (Signed)
   04/22/15 0838  [facility]  Invasive or Noninvasive (standby)  patient and order verified.  Patient extubated to 2lpm [place].  Rn present at bedside.  Patient is tolerating well.

## 2015-04-23 ENCOUNTER — Encounter (HOSPITAL_COMMUNITY): Payer: Self-pay | Admitting: Internal Medicine

## 2015-04-23 DIAGNOSIS — E878 Other disorders of electrolyte and fluid balance, not elsewhere classified: Secondary | ICD-10-CM | POA: Diagnosis present

## 2015-04-23 DIAGNOSIS — D649 Anemia, unspecified: Secondary | ICD-10-CM

## 2015-04-23 DIAGNOSIS — E876 Hypokalemia: Secondary | ICD-10-CM

## 2015-04-23 DIAGNOSIS — J9600 Acute respiratory failure, unspecified whether with hypoxia or hypercapnia: Secondary | ICD-10-CM

## 2015-04-23 DIAGNOSIS — I959 Hypotension, unspecified: Secondary | ICD-10-CM | POA: Diagnosis present

## 2015-04-23 DIAGNOSIS — F1910 Other psychoactive substance abuse, uncomplicated: Secondary | ICD-10-CM

## 2015-04-23 DIAGNOSIS — E872 Acidosis, unspecified: Secondary | ICD-10-CM | POA: Diagnosis present

## 2015-04-23 DIAGNOSIS — E8729 Other acidosis: Secondary | ICD-10-CM | POA: Diagnosis present

## 2015-04-23 DIAGNOSIS — G9340 Encephalopathy, unspecified: Secondary | ICD-10-CM | POA: Diagnosis present

## 2015-04-23 DIAGNOSIS — R739 Hyperglycemia, unspecified: Secondary | ICD-10-CM | POA: Diagnosis present

## 2015-04-23 DIAGNOSIS — F1094 Alcohol use, unspecified with alcohol-induced mood disorder: Secondary | ICD-10-CM

## 2015-04-23 HISTORY — DX: Anemia, unspecified: D64.9

## 2015-04-23 HISTORY — DX: Acute respiratory failure, unspecified whether with hypoxia or hypercapnia: J96.00

## 2015-04-23 LAB — GLUCOSE, CAPILLARY
Glucose-Capillary: 120 mg/dL — ABNORMAL HIGH (ref 65–99)
Glucose-Capillary: 126 mg/dL — ABNORMAL HIGH (ref 65–99)
Glucose-Capillary: 131 mg/dL — ABNORMAL HIGH (ref 65–99)
Glucose-Capillary: 132 mg/dL — ABNORMAL HIGH (ref 65–99)
Glucose-Capillary: 134 mg/dL — ABNORMAL HIGH (ref 65–99)
Glucose-Capillary: 116 mg/dL — ABNORMAL HIGH (ref 65–99)
Glucose-Capillary: 104 mg/dL — ABNORMAL HIGH (ref 65–99)

## 2015-04-23 LAB — PROCALCITONIN: Procalcitonin: <0.1 ng/mL

## 2015-04-23 MED ORDER — INSULIN ASPART 100 UNIT/ML ~~LOC~~ SOLN
0.0000 [IU] | Freq: Three times a day (TID) | SUBCUTANEOUS | Status: DC
  Administered 2015-04-23: 1 [IU] via SUBCUTANEOUS

## 2015-04-23 MED ORDER — INSULIN ASPART 100 UNIT/ML ~~LOC~~ SOLN: 0.0000 [IU] | Freq: Every day | SUBCUTANEOUS | Status: DC

## 2015-04-23 NOTE — Progress Notes (Signed)
Progress Note   RA CLENDANIEL PHX:[id number] DOB: October 08, 1971 DOA: 04/21/2015 PCP: PROVIDER NOT IN SYSTEM   Brief Narrative:   Kelsey [name] is an 44 y.o. female with a PMH of alcohol abuse, depression, and suicide attempts who was admitted by the critical care team on 04/21/15 after intentionally overdosing on fluoxetine, hydrocodone, BuSpar, trazodone, Seroquel, bupropion and topiramate. She was hypotensive and unresponsive on admission and intubated for airway protection. She was successfully extubated 04/22/15. She was evaluated by the psychiatrist and not felt to meet criteria for psychiatric inpatient admission.  Assessment/Plan:   Principal Problem:   Intentional overdose of multiple pharmaceuticals with acute ventilatory dependent respiratory failure/hypotension with circulatory shock - Admitted by critical care team. Hydrated. - Intubated on admission. Extubated 04/22/15. - Transfer to telemetry, monitor QTc.  Active Problems:   Neck swelling - CT of the head shows possible injury to the neck swelling about the thyroid gland. - Supportive care.    Polysubstance abuse/Psychoactive substance-induced mood disorder/Acute encephalopathy - CT of the head negative on admission.  - Evaluated by psychiatrist 04/22/15. - Not felt to meet criteria for inpatient psychiatric hospitalization.    Metabolic acidosis/ lactic acidosis / hyperchloremia - Non-anion gap in the setting of massive normal saline resuscitation causing hyperchloremia. - Continue IV fluids.    Hyperglycemia - Check hemoglobin A1c. - Currently being managed with SSI, insulin sensitive scale every 4 hours. CBGs 124-164. - Change SSI, sensitive scale, to every before meals and at bedtime.    Hypokalemia - Resolved with supplementation.    Normocytic anemia - Likely secondary to menstrual losses in this 44 year old female. Would not workup further.    DVT Prophylaxis - Continue heparin.  Code  Status: Full. Family Communication: No family currently at the bedside. Disposition Plan: Home 04/24/15 if she remains stable.   IV Access:    Peripheral IV   Procedures and diagnostic studies:   Dg Abd 1 View  04/21/2015   CLINICAL DATA:  Drug overdose  EXAM: ABDOMEN - 1 VIEW  COMPARISON:  None.  FINDINGS: NG tube in the stomach. Normal bowel gas pattern. No renal calculi. Bilateral pelvic calcifications most likely phleboliths. No acute bony abnormality.  IMPRESSION: Negative.   Electronically Signed   By: Marlan Palau M.D.   On: 04/21/2015 07:37   Ct Head Wo Contrast  04/21/2015   CLINICAL DATA:  Altered mental status.  Overdose.  EXAM: CT HEAD WITHOUT CONTRAST  TECHNIQUE: Contiguous axial images were obtained from the base of the skull through the vertex without intravenous contrast.  COMPARISON:  None.  FINDINGS: Ventricle size is normal. Negative for acute or chronic infarction. Negative for hemorrhage or fluid collection. Negative for mass or edema. No shift of the midline structures.  Calvarium is intact. Mucosal edema in the paranasal sinuses without air-fluid level. Pharyngeal edema related to intubation.  IMPRESSION: Normal CT of the head.   Electronically Signed   By: Marlan Palau M.D.   On: 04/21/2015 08:01   Ct Soft Tissue Neck W Contrast  04/21/2015   ADDENDUM REPORT: 04/21/2015 08:37  ADDENDUM: These results were called by telephone at the time of interpretation on 04/21/2015 at 8:36 am to Dr. Effie Shy , who verbally acknowledged these results.   Electronically Signed   By: Marlan Palau M.D.   On: 04/21/2015 08:37   04/21/2015   CLINICAL DATA:  Overdose. Post intubation. Left neck swelling post intubation.  EXAM: CT NECK WITH CONTRAST  TECHNIQUE: Multidetector CT  imaging of the neck was performed using the standard protocol following the bolus administration of intravenous contrast.  CONTRAST:  80mL OMNIPAQUE IOHEXOL 300 MG/ML  SOLN  COMPARISON:  None.  FINDINGS: Pharynx and  larynx: The patient is intubated. Endotracheal tube extends into the trachea. The endotracheal tube balloon is inflated just below the larynx. The hyoid bone is mildly displaced to the left. The thyroid cartilage is more significantly displaced to the left raising the possibility of thyrohyoid ligament disruption. Larynx is displaced to the left of midline significantly. Arytenoid cartilage in normal position bilaterally. No gas in the soft tissues. NG tube in the esophagus. Pharyngeal edema is present surrounding the endotracheal tube. There is fluid in the nasopharynx.  Salivary glands: Negative  Thyroid: Thyroid is diffusely abnormal bilaterally with multiple linear areas of low-density throughout the gland. There is fluid density surrounding the thyroid gland bilaterally. Etiology is not clear but this may be related to trauma from intubation or neck manipulation. Thyroiditis less likely.  Lymph nodes: No adenopathy  Vascular: Carotid artery patent bilaterally without stenosis. Jugular vein patent bilaterally.  Limited intracranial: Negative  Visualized orbits: Negative  Mastoids and visualized paranasal sinuses: Mucosal edema in the ethmoid sinuses. Mastoid sinus clear bilaterally.  Skeleton: Negative  Upper chest: Lung apices clear  IMPRESSION: Endotracheal tube extends into the trachea. NG tube in the esophagus.  The hyoid bone is mildly displaced to the left. There is more significant displacement of the thyroid cartilage to the left. This raises the possibility of thyrohyoid ligament disruption or cartilage injury.  Diffuse edema throughout the thyroid gland with fluid surrounding the thyroid gland bilaterally, question trauma to the thyroid gland.  Electronically Signed: By: Marlan Palau M.D. On: 04/21/2015 08:29   Dg Chest Port 1 View  04/22/2015   CLINICAL DATA:  Intubation.  EXAM: PORTABLE CHEST - 1 VIEW  COMPARISON:  04/21/2015.  FINDINGS: Endotracheal tube and NG tube in stable position.  Mediastinum hilar structures are normal. Lungs are clear. Heart size normal. No pleural effusion or pneumothorax.  IMPRESSION: 1. Lines and tubes in stable position. 2. No acute cardiopulmonary disease.   Electronically Signed   By: Maisie Fus  Register   On: 04/22/2015 07:05   Dg Chest Portable 1 View  04/21/2015   CLINICAL DATA:  Intubation drug overdose  EXAM: PORTABLE CHEST - 1 VIEW  COMPARISON:  04/21/2015  FINDINGS: Endotracheal tube in good position.  NG tube enters the stomach.  Hypoventilation with decreased lung volume compared with earlier study. Mild left lower lobe atelectasis. Negative for pneumonia or effusion  IMPRESSION: Endotracheal tube in good position.  NG tube in the stomach  Mild left lower lobe atelectasis secondary to hypoventilation.   Electronically Signed   By: Marlan Palau M.D.   On: 04/21/2015 07:45   Dg Chest Port 1 View  04/21/2015   CLINICAL DATA:  Altered mental status.  Unresponsive  EXAM: PORTABLE CHEST - 1 VIEW  COMPARISON:  None.  FINDINGS: The heart size and mediastinal contours are within normal limits. Both lungs are clear. The visualized skeletal structures are unremarkable.  IMPRESSION: No active disease.   Electronically Signed   By: Marlan Palau M.D.   On: 04/21/2015 06:58     Medical Consultants:    Psychiatry  Anti-Infectives:    None.  Subjective:   Kelsey [name] says that she is feeling better. Denies current active suicidal ideation. No complaints of pain or nausea.   Objective:    Filed Vitals:  04/23/15 0300 04/23/15 0345 04/23/15 0400 04/23/15 0500  BP: 114/73   119/78  Pulse: 66  72 70  Temp: 98.4 F (36.9 C) 98.6 F (37 C) 98.4 F (36.9 C) 98.4 F (36.9 C)  TempSrc:  Oral  Core (Comment)  Resp: Height:      Weight:  79.9 kg (176 lb 2.4 oz)    SpO2: 97%  97% 98%    Intake/Output Summary (Last 24 hours) at 04/23/15 0714 Last data filed at 04/23/15 0500  Gross per 24 hour  Intake 3126.67 ml  Output    1175 ml  Net 1951.67 ml    Exam: Gen:  NAD Psych: Mood stable Cardiovascular:  RRR, No M/R/G Respiratory:  Lungs CTAB Gastrointestinal:  Abdomen soft, NT/ND, + BS Extremities:  No C/E/C   Data Reviewed:    Labs: Basic Metabolic Panel:  Recent Labs Lab 04/21/15 0730 04/21/15 0947 04/21/15 2015 04/22/15 0335  NA 144 144 143 142  K 3.3* 3.2* 4.0 3.8  CL 105 109 116* 117*  CO2  --  26 18* 19*  GLUCOSE 237* 167* 151* 161*  BUN CREATININE 1.80* 1.08* 0.80 0.74  CALCIUM  --  8.7* 8.7* 8.5*   GFR Estimated Creatinine Clearance: 100.6 mL/min (by C-G formula based on Cr of 0.74). Liver Function Tests:  Recent Labs Lab 04/21/15 0641 04/21/15 0947  AST 27 25  ALT 24 23  ALKPHOS 39 38  BILITOT 0.5 1.0  PROT 6.9 5.9*  ALBUMIN 3.9 3.5    Recent Labs Lab 04/21/15 0641  LIPASE 55*   No results for input(s): AMMONIA in the last 168 hours. Coagulation profile  Recent Labs Lab 04/21/15 0641 04/21/15 0947  INR 0.93 1.00    CBC:  Recent Labs Lab 04/21/15 0641 04/21/15 0730 04/22/15 0500  WBC 5.0  --  11.4*  NEUTROABS 2.7  --   --   HGB 10.5* 10.5* 9.7*  HCT 31.8* 31.0* 28.6*  MCV 97.0  --  97.9  PLT 146*  --  163   Cardiac Enzymes: No results for input(s): CKTOTAL, CKMB, CKMBINDEX, TROPONINI in the last 168 hours. BNP (last 3 results) No results for input(s): PROBNP in the last 8760 hours. CBG:  Recent Labs Lab 04/22/15 0327 04/22/15 0816 04/22/15 1129 04/22/15 1540 04/22/15 1644  GLUCAP 143* 164* 143* 148* 124*   D-Dimer: No results for input(s): DDIMER in the last 72 hours. Hgb A1c: No results for input(s): HGBA1C in the last 72 hours. Lipid Profile: No results for input(s): CHOL, HDL, LDLCALC, TRIG, CHOLHDL, LDLDIRECT in the last 72 hours. Thyroid function studies:  Recent Labs  04/21/15 0724  TSH 1.673   Anemia work up:  Recent Labs  04/21/15 2015  IRON 44   Sepsis Labs:  Recent Labs Lab 04/21/15 0641  04/21/15 0731 04/21/15 0947 04/22/15 0335 04/22/15 0500 04/23/15 0351  PROCALCITON  --   --  <0.10 <0.10  --  <0.10  WBC 5.0  --   --   --  11.4*  --   LATICACIDVEN  --  2.72* 2.7*  --   --   --    Microbiology Recent Results (from the past 240 hour(s))  MRSA PCR Screening     Status: None   Collection Time: 04/21/15 11:14 AM  Result Value Ref Range Status   MRSA by PCR NEGATIVE NEGATIVE Final    Comment:        The GeneXpert  MRSA Assay (FDA approved for NASAL specimens only), is one component of a comprehensive MRSA colonization surveillance program. It is not intended to diagnose MRSA infection nor to guide or monitor treatment for MRSA infections.   Culture, respiratory (tracheal aspirate)     Status: None (Preliminary result)   Collection Time: 04/21/15  2:00 PM  Result Value Ref Range Status   Specimen Description TRACHEAL ASPIRATE  Final   Special Requests NONE  Final   Gram Stain   Final    FEW WBC PRESENT,BOTH PMN AND MONONUCLEAR NO SQUAMOUS EPITHELIAL CELLS SEEN NO ORGANISMS SEEN Performed at [facility]    Culture NO GROWTH Performed at [facility]   Final   Report Status PENDING  Incomplete     Medications:   . antiseptic oral rinse  7 mL Mouth Rinse QID  . heparin  5,000 Units Subcutaneous 3 times per day  . insulin aspart  0-9 Units Subcutaneous 6 times per day   Continuous Infusions: . dextrose 5% lactated ringers 125 mL/hr at 04/22/15 1504    Time spent: 25 minutes.   LOS: 2 days   RAMA,CHRISTINA  [facility] Pager [phone]. If unable to reach me by pager, please call my cell phone at [phone].  *Please refer to [email or web address], password [email or web address] to get updated schedule on who will round on this patient, as hospitalists switch teams weekly. If 7PM-7AM, please contact night-coverage at [email or web address], password [email or web address] for any overnight needs.  04/23/2015, 7:14 AM

## 2015-04-23 NOTE — Plan of Care (Signed)
Problem: Phase II Progression Outcomes Goal: Date pt extubated/weaned off vent Outcome: Completed/Met Date Met:  04/23/15 6/17

## 2015-04-24 ENCOUNTER — Encounter (HOSPITAL_COMMUNITY): Payer: Self-pay | Admitting: Internal Medicine

## 2015-04-24 DIAGNOSIS — F1020 Alcohol dependence, uncomplicated: Secondary | ICD-10-CM

## 2015-04-24 DIAGNOSIS — I959 Hypotension, unspecified: Secondary | ICD-10-CM

## 2015-04-24 DIAGNOSIS — F331 Major depressive disorder, recurrent, moderate: Secondary | ICD-10-CM | POA: Insufficient documentation

## 2015-04-24 LAB — BASIC METABOLIC PANEL
Anion gap: 7 (ref 5–15)
BUN: 13 mg/dL (ref 6–20)
CO2: 24 mmol/L (ref 22–32)
Calcium: 8.5 mg/dL — ABNORMAL LOW (ref 8.9–10.3)
Chloride: 106 mmol/L (ref 101–111)
Creatinine, Ser: 0.72 mg/dL (ref 0.44–1.00)
GFR calc Af Amer: >60 mL/min (ref >60)
GFR calc non Af Amer: >60 mL/min (ref >60)
Glucose, Bld: 96 mg/dL (ref 65–99)
Potassium: 3.7 mmol/L (ref 3.5–5.1)
Sodium: 137 mmol/L (ref 135–145)

## 2015-04-24 LAB — CULTURE, RESPIRATORY W GRAM STAIN

## 2015-04-24 LAB — GLUCOSE, CAPILLARY
Glucose-Capillary: 87 mg/dL (ref 65–99)
Glucose-Capillary: 88 mg/dL (ref 65–99)
Glucose-Capillary: 95 mg/dL (ref 65–99)

## 2015-04-24 NOTE — Clinical Social Work Note (Signed)
CSW received a consult for pt needing residential drug/alcohol program.  CSW met with pt who stated that she has had 5 DWI's and must do an assessment for the courts  Pt stated that she knows where to go for this assessment and was supposed to go last Wednesday but she missed her appointment.    CSW offered programs for her to consider but pt stated that she MUST follow the courts process which states she completes a DWI assessment and produce this assessment to her attorney.  Pt stated that she believes she will be recommended for inpatient alchohol treatment but she needs to follow the protocol per the courts and her attorney.  CSW provided information to pt's MD and it was decided that psychiatric consult be placed to assess for pt discharge since she is medically stable  CSW called the therapeutic triage line and they will send the on call psychiatrists out today for the consult since it was received before noon.  CSW will continue to follow pt until discharge  CSW will complete full psychosocial assessment  .Dede Query, LCSW [facility] Clinical Social Worker - Weekend Coverage cell #: [phone]

## 2015-04-24 NOTE — Progress Notes (Signed)
Completed D/C teaching with patient. Answered all questions. Pt is in stable condition and being discharged home with family.

## 2015-04-24 NOTE — Discharge Summary (Signed)
Physician Discharge Summary  Kelsey [name] ZOX:[id number] DOB: [birth year] DOA: 04/21/2015  PCP: Salli Real, MD  Admit date: 04/21/2015 Discharge date: 04/24/2015   Recommendations for Outpatient Follow-Up:   1. Please F/U on hemoglobin A1c, patient had mildly elevated blood glucoses in the 130s-160s while in the hospital.   Discharge Diagnosis:   Principal Problem:    Intentional overdose of multiple pharmaceuticals Active Problems:    Polysubstance abuse    Psychoactive substance-induced mood disorder    Acute encephalopathy    Metabolic acidosis    Hyperglycemia    Hypokalemia    Normocytic anemia    Lactic acidosis    Acute ventilatory dependent respiratory failure    Hypotension with circulatory shock    Hyperchloremia    Alcohol use disorder, severe, dependence    Major depressive disorder, recurrent episode, moderate   Discharge disposition:  Home.  Cleared by psychiatrist.  Discharge Condition: Improved.  Diet recommendation: Carbohydrate-modified.     History of Present Illness:   Kelsey [name] is an 44 y.o. female with a PMH of alcohol abuse, depression, and suicide attempts who was admitted by the critical care team on 04/21/15 after intentionally overdosing on fluoxetine, hydrocodone, BuSpar, trazodone, Seroquel, bupropion and topiramate. She was hypotensive and unresponsive on admission and intubated for airway protection. She was successfully extubated 04/22/15. She was evaluated by the psychiatrist and not felt to meet criteria for psychiatric inpatient admission.  Hospital Course by Problem:   Principal Problem:  Intentional overdose of multiple pharmaceuticals with acute ventilatory dependent respiratory failure/hypotension with circulatory shock - Admitted by critical care team. Hydrated. - Intubated on admission. Extubated 04/22/15. - Medically stable for discharge. Cleared by psychiatrist.  Active Problems:  Neck  swelling - CT of the head shows possible injury to the neck swelling about the thyroid gland. - Provided with supportive care. Asymptomatic at discharge.   Polysubstance abuse/Psychoactive substance-induced mood disorder/Acute encephalopathy - CT of the head negative on admission.  - Evaluated by psychiatrist 04/22/15. - Not felt to meet criteria for inpatient psychiatric hospitalization. - Counseled by Child psychotherapist. Plans to follow-up with court recommendations to seek inpatient rehabilitation.   Metabolic acidosis/ lactic acidosis / hyperchloremia - Non-anion gap in the setting of massive normal saline resuscitation causing hyperchloremia.   Hyperglycemia - Follow-up hemoglobin A1c. - Received sliding scale insulin while in hospital. Recommend close follow-up with PCP.   Hypokalemia - Resolved with supplementation.   Normocytic anemia - Likely secondary to menstrual losses in this 44 year old female. Would not workup further.    Medical Consultants:    Psychiatry  Critical care   Discharge Exam:   Filed Vitals:   04/24/15 1501  BP: 140/91  Pulse: 72  Temp: 98.4 F (36.9 C)  Resp: 20   Filed Vitals:   04/23/15 1400 04/23/15 2130 04/24/15 0530 04/24/15 1501  BP: 134/86 132/89 124/91 140/91  Pulse: 79 76 72 72  Temp: 98.3 F (36.8 C) 98.5 F (36.9 C) 98.2 F (36.8 C) 98.4 F (36.9 C)  TempSrc: Oral Oral Oral Oral  Resp: Height:      Weight:      SpO2: 98% 99% 99% 98%    Gen:  NAD Cardiovascular:  RRR, No M/R/G Respiratory: Lungs CTAB Gastrointestinal: Abdomen soft, NT/ND with normal active bowel sounds. Extremities: No C/E/C   The results of significant diagnostics from this hospitalization (including imaging, microbiology, ancillary and laboratory) are listed below for reference.     Procedures  and Diagnostic Studies:   Dg Abd 1 View  04/21/2015   CLINICAL DATA:  Drug overdose  EXAM: ABDOMEN - 1 VIEW  COMPARISON:  None.   FINDINGS: NG tube in the stomach. Normal bowel gas pattern. No renal calculi. Bilateral pelvic calcifications most likely phleboliths. No acute bony abnormality.  IMPRESSION: Negative.   Electronically Signed   By: Marlan Palau M.D.   On: 04/21/2015 07:37   Ct Head Wo Contrast  04/21/2015   CLINICAL DATA:  Altered mental status.  Overdose.  EXAM: CT HEAD WITHOUT CONTRAST  TECHNIQUE: Contiguous axial images were obtained from the base of the skull through the vertex without intravenous contrast.  COMPARISON:  None.  FINDINGS: Ventricle size is normal. Negative for acute or chronic infarction. Negative for hemorrhage or fluid collection. Negative for mass or edema. No shift of the midline structures.  Calvarium is intact. Mucosal edema in the paranasal sinuses without air-fluid level. Pharyngeal edema related to intubation.  IMPRESSION: Normal CT of the head.   Electronically Signed   By: Marlan Palau M.D.   On: 04/21/2015 08:01   Ct Soft Tissue Neck W Contrast  04/21/2015   ADDENDUM REPORT: 04/21/2015 08:37  ADDENDUM: These results were called by telephone at the time of interpretation on 04/21/2015 at 8:36 am to Dr. Effie Shy , who verbally acknowledged these results.   Electronically Signed   By: Marlan Palau M.D.   On: 04/21/2015 08:37   04/21/2015   CLINICAL DATA:  Overdose. Post intubation. Left neck swelling post intubation.  EXAM: CT NECK WITH CONTRAST  TECHNIQUE: Multidetector CT imaging of the neck was performed using the standard protocol following the bolus administration of intravenous contrast.  CONTRAST:  40mL OMNIPAQUE IOHEXOL 300 MG/ML  SOLN  COMPARISON:  None.  FINDINGS: Pharynx and larynx: The patient is intubated. Endotracheal tube extends into the trachea. The endotracheal tube balloon is inflated just below the larynx. The hyoid bone is mildly displaced to the left. The thyroid cartilage is more significantly displaced to the left raising the possibility of thyrohyoid ligament disruption.  Larynx is displaced to the left of midline significantly. Arytenoid cartilage in normal position bilaterally. No gas in the soft tissues. NG tube in the esophagus. Pharyngeal edema is present surrounding the endotracheal tube. There is fluid in the nasopharynx.  Salivary glands: Negative  Thyroid: Thyroid is diffusely abnormal bilaterally with multiple linear areas of low-density throughout the gland. There is fluid density surrounding the thyroid gland bilaterally. Etiology is not clear but this may be related to trauma from intubation or neck manipulation. Thyroiditis less likely.  Lymph nodes: No adenopathy  Vascular: Carotid artery patent bilaterally without stenosis. Jugular vein patent bilaterally.  Limited intracranial: Negative  Visualized orbits: Negative  Mastoids and visualized paranasal sinuses: Mucosal edema in the ethmoid sinuses. Mastoid sinus clear bilaterally.  Skeleton: Negative  Upper chest: Lung apices clear  IMPRESSION: Endotracheal tube extends into the trachea. NG tube in the esophagus.  The hyoid bone is mildly displaced to the left. There is more significant displacement of the thyroid cartilage to the left. This raises the possibility of thyrohyoid ligament disruption or cartilage injury.  Diffuse edema throughout the thyroid gland with fluid surrounding the thyroid gland bilaterally, question trauma to the thyroid gland.  Electronically Signed: By: Marlan Palau M.D. On: 04/21/2015 08:29   Dg Chest Port 1 View  04/22/2015   CLINICAL DATA:  Intubation.  EXAM: PORTABLE CHEST - 1 VIEW  COMPARISON:  04/21/2015.  FINDINGS:  Endotracheal tube and NG tube in stable position. Mediastinum hilar structures are normal. Lungs are clear. Heart size normal. No pleural effusion or pneumothorax.  IMPRESSION: 1. Lines and tubes in stable position. 2. No acute cardiopulmonary disease.   Electronically Signed   By: Maisie Fus  Register   On: 04/22/2015 07:05   Dg Chest Portable 1 View  04/21/2015    CLINICAL DATA:  Intubation drug overdose  EXAM: PORTABLE CHEST - 1 VIEW  COMPARISON:  04/21/2015  FINDINGS: Endotracheal tube in good position.  NG tube enters the stomach.  Hypoventilation with decreased lung volume compared with earlier study. Mild left lower lobe atelectasis. Negative for pneumonia or effusion  IMPRESSION: Endotracheal tube in good position.  NG tube in the stomach  Mild left lower lobe atelectasis secondary to hypoventilation.   Electronically Signed   By: Marlan Palau M.D.   On: 04/21/2015 07:45   Dg Chest Port 1 View  04/21/2015   CLINICAL DATA:  Altered mental status.  Unresponsive  EXAM: PORTABLE CHEST - 1 VIEW  COMPARISON:  None.  FINDINGS: The heart size and mediastinal contours are within normal limits. Both lungs are clear. The visualized skeletal structures are unremarkable.  IMPRESSION: No active disease.   Electronically Signed   By: Marlan Palau M.D.   On: 04/21/2015 06:58     Labs:   Basic Metabolic Panel:  Recent Labs Lab 04/21/15 0730 04/21/15 0947 04/21/15 2015 04/22/15 0335 04/24/15 0424  NA 144 144 143 142 137  K 3.3* 3.2* 4.0 3.8 3.7  CL 105 109 116* 117* 106  CO2  --  26 18* 19* 24  GLUCOSE 237* 167* 151* 161* 96  BUN CREATININE 1.80* 1.08* 0.80 0.74 0.72  CALCIUM  --  8.7* 8.7* 8.5* 8.5*   GFR Estimated Creatinine Clearance: 100.6 mL/min (by C-G formula based on Cr of 0.72). Liver Function Tests:  Recent Labs Lab 04/21/15 0641 04/21/15 0947  AST 27 25  ALT 24 23  ALKPHOS 39 38  BILITOT 0.5 1.0  PROT 6.9 5.9*  ALBUMIN 3.9 3.5    Recent Labs Lab 04/21/15 0641  LIPASE 55*   Coagulation profile  Recent Labs Lab 04/21/15 0641 04/21/15 0947  INR 0.93 1.00    CBC:  Recent Labs Lab 04/21/15 0641 04/21/15 0730 04/22/15 0500  WBC 5.0  --  11.4*  NEUTROABS 2.7  --   --   HGB 10.5* 10.5* 9.7*  HCT 31.8* 31.0* 28.6*  MCV 97.0  --  97.9  PLT 146*  --  163   CBG:  Recent Labs Lab 04/23/15 1656  04/23/15 2131 04/24/15 0807 04/24/15 1156 04/24/15 1633  GLUCAP 116* 104* 87 88 95    Anemia work up  Recent Labs  04/21/15 2015  IRON 44   Microbiology Recent Results (from the past 240 hour(s))  MRSA PCR Screening     Status: None   Collection Time: 04/21/15 11:14 AM  Result Value Ref Range Status   MRSA by PCR NEGATIVE NEGATIVE Final    Comment:        The GeneXpert MRSA Assay (FDA approved for NASAL specimens only), is one component of a comprehensive MRSA colonization surveillance program. It is not intended to diagnose MRSA infection nor to guide or monitor treatment for MRSA infections.   Culture, respiratory (tracheal aspirate)     Status: None   Collection Time: 04/21/15  2:00 PM  Result Value Ref Range Status   Specimen  Description TRACHEAL ASPIRATE  Final   Special Requests NONE  Final   Gram Stain   Final    FEW WBC PRESENT,BOTH PMN AND MONONUCLEAR NO SQUAMOUS EPITHELIAL CELLS SEEN NO ORGANISMS SEEN Performed at [facility]    Culture   Final    Non-Pathogenic Oropharyngeal-type Flora Isolated. Performed at [facility]    Report Status 04/24/2015 FINAL  Final     Discharge Instructions:   Discharge Instructions    Activity as tolerated - No restrictions    Complete by:  As directed      Call MD for:    Complete by:  As directed   Suicidal thoughts or feelings or impulses to harm yourself.     Diet Carb Modified    Complete by:  As directed      Discharge instructions    Complete by:  As directed   Make sure you follow up with your PCP to evaluate your elevated blood sugars.    Avoid alcohol.  Follow up with a rehab program as recommended by the court.            Medication List    STOP taking these medications        acamprosate 333 MG tablet  Commonly known as:  CAMPRAL     buPROPion 150 MG 24 hr tablet  Commonly known as:  WELLBUTRIN XL     FLUoxetine 10 MG capsule  Commonly known as:  PROZAC      HYDROcodone-acetaminophen 5-325 MG per tablet  Commonly known as:  NORCO/VICODIN     promethazine 25 MG tablet  Commonly known as:  PHENERGAN     traZODone 50 MG tablet  Commonly known as:  DESYREL      TAKE these medications        aspirin EC 81 MG tablet  Take 1 tablet (81 mg total) by mouth daily. For heart health     ferrous sulfate 325 (65 FE) MG tablet  Take 1 tablet (325 mg total) by mouth daily with breakfast. For low iron     multivitamin with minerals Tabs tablet  Take 1 tablet by mouth daily. For low vitamin          Time coordinating discharge: 25 minutes.  Signed:  RAMA,CHRISTINA  Pager [phone] [facility] 04/24/2015, 5:35 PM

## 2015-04-24 NOTE — Consult Note (Signed)
[facility] Consult   Reason for Consult:  Alcohol dependence, depression Referring Physician:  Dr. Rockne Menghini Patient Identification: Kelsey [name] MRN:  [id number] Principal Diagnosis: 1. Alcohol use disorder severe. 2. Major depressive disorder recurrent moderate Diagnosis:   Patient Active Problem List   Diagnosis Date Noted  . Acute encephalopathy [G93.40] 04/23/2015  . Metabolic acidosis [J19.1] 04/23/2015  . Hyperglycemia [R73.9] 04/23/2015  . Hypokalemia [E87.6] 04/23/2015  . Normocytic anemia [D64.9] 04/23/2015  . Lactic acidosis [E87.2] 04/23/2015  . Acute ventilatory dependent respiratory failure [J96.00] 04/23/2015  . Hypotension with circulatory shock [I95.9] 04/23/2015  . Hyperchloremia [E87.8] 04/23/2015  . Polysubstance abuse [F19.10] 04/22/2015  . 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 Intentional overdose of multiple pharmaceuticals [T50.901A] 04/21/2015  . GAD (generalized anxiety disorder) [F41.1] 05/18/2014  . Alcohol dependence [F10.20] 05/15/2014    Total Time spent with patient: 50 minutes  Subjective:   Kelsey [name] is a 44 y.o. female patient admitted with intentional overdose on multiple medications  HPI:  Kelsey [name] is a 44 year old female admitted to [facility] long intensive care unit with alcohol intoxication and intentional overdose of multiple medications including fluoxetine, BuSpar, trazodone, Seroquel, Wellbutrin and Topamax. Patient reported she was overwhelmed and stressed out after she got 5th DWI charges with the possibility of going to jail if she does not get her act together. Patient is single, lives with her mother, currently employed Kelsey a Data processing manager at [employer]. She reports being diagnosed with depression since age 44 and started drinking alcohol heavily since age 21. She states that she has been detoxed from alcohol about 5-6 times in the last 10 years and attended at least 2  previous alcohol rehab programs. Patient reports that she has been mandated to get evaluated for DWI and has scheduled an appointment.  Today, she alert and oriented x 4 denies suicidal thoughts, psychosis, delusional thinking or depressive symptoms. She states that she took all those medication in order to get a "good sleep" but not to commit suicide. She states she has a lot to live for, states she will not put her 51 year old son in that mess. Patient expressed her interest chemical dependency rehabilitation treatment when medically and psychiatrically stable.   HPI Elements:   Location:  overdosed, alcoho dependence. Quality:  severe. Duration:  long history of depression and alcohol abuse. Context:  inability to stop drinking.  Past Medical History:  Past Medical History  Diagnosis Date  . Alcohol abuse     History of 5 DWIs.  . Depression   . Suicide attempt   . Intentional overdose of multiple pharmaceuticals 04/21/2015  . Normocytic anemia 04/23/2015  . Acute ventilatory dependent respiratory failure 04/23/2015  . Psychoactive substance-induced mood disorder 04/22/2015    Past Surgical History  Procedure Laterality Date  . Cosmetic surgery     Family History: No family history on file. Social History:  History  Alcohol Use  . 18.0 oz/week  . 30 Glasses of wine per week     History  Drug Use No    History   Social History  . Marital Status: Single    Spouse Name: N/A  . Number of Children: N/A  . Years of Education: N/A   Social History Main Topics  . Smoking status: Never Smoker   . Smokeless tobacco: Not on file  . Alcohol Use: 18.0 oz/week    30 Glasses of wine per week  . Drug Use: No  .  Sexual Activity: No   Other Topics Concern  . None   Social History Narrative   Additional Social History:                          Allergies:  No Known Allergies  Labs:  Results for orders placed or performed during the hospital encounter of 04/21/15  (from the past 48 hour(s))  Glucose, capillary     Status: Abnormal   Collection Time: 04/22/15  3:40 PM  Result Value Ref Range   Glucose-Capillary 148 (H) 65 - 99 mg/dL  Glucose, capillary     Status: Abnormal   Collection Time: 04/22/15  4:44 PM  Result Value Ref Range   Glucose-Capillary 124 (H) 65 - 99 mg/dL  Glucose, capillary     Status: Abnormal   Collection Time: 04/22/15  7:51 PM  Result Value Ref Range   Glucose-Capillary 131 (H) 65 - 99 mg/dL   Comment 1 Notify RN    Comment 2 Document in Chart   Glucose, capillary     Status: Abnormal   Collection Time: 04/22/15 11:33 PM  Result Value Ref Range   Glucose-Capillary 132 (H) 65 - 99 mg/dL   Comment 1 Notify RN    Comment 2 Document in Chart   Glucose, capillary     Status: Abnormal   Collection Time: 04/23/15  3:39 AM  Result Value Ref Range   Glucose-Capillary 134 (H) 65 - 99 mg/dL  Procalcitonin     Status: None   Collection Time: 04/23/15  3:51 AM  Result Value Ref Range   Procalcitonin <0.10 ng/mL    Comment:        Interpretation: PCT (Procalcitonin) <= 0.5 ng/mL: Systemic infection (sepsis) is not likely. Local bacterial infection is possible. (NOTE)         ICU PCT Algorithm               Non ICU PCT Algorithm    ----------------------------     ------------------------------         PCT < 0.25 ng/mL                 PCT < 0.1 ng/mL     Stopping of antibiotics            Stopping of antibiotics       strongly encouraged.               strongly encouraged.    ----------------------------     ------------------------------       PCT level decrease by               PCT < 0.25 ng/mL       >= 80% from peak PCT       OR PCT 0.25 - 0.5 ng/mL          Stopping of antibiotics                                             encouraged.     Stopping of antibiotics           encouraged.    ----------------------------     ------------------------------       PCT level decrease by              PCT >= 0.25 ng/mL       <  80% from peak PCT        AND PCT >= 0.5 ng/mL            Continuin g antibiotics                                              encouraged.       Continuing antibiotics            encouraged.    ----------------------------     ------------------------------     PCT level increase compared          PCT > 0.5 ng/mL         with peak PCT AND          PCT >= 0.5 ng/mL             Escalation of antibiotics                                          strongly encouraged.      Escalation of antibiotics        strongly encouraged.   Glucose, capillary     Status: Abnormal   Collection Time: 04/23/15  7:39 AM  Result Value Ref Range   Glucose-Capillary 120 (H) 65 - 99 mg/dL   Comment 1 Notify RN    Comment 2 Document in Chart   Glucose, capillary     Status: Abnormal   Collection Time: 04/23/15 11:16 AM  Result Value Ref Range   Glucose-Capillary 126 (H) 65 - 99 mg/dL  Glucose, capillary     Status: Abnormal   Collection Time: 04/23/15  4:56 PM  Result Value Ref Range   Glucose-Capillary 116 (H) 65 - 99 mg/dL  Glucose, capillary     Status: Abnormal   Collection Time: 04/23/15  9:31 PM  Result Value Ref Range   Glucose-Capillary 104 (H) 65 - 99 mg/dL   Comment 1 Notify RN    Comment 2 Document in Chart   Basic metabolic panel     Status: Abnormal   Collection Time: 04/24/15  4:24 AM  Result Value Ref Range   Sodium 137 135 - 145 mmol/L   Potassium 3.7 3.5 - 5.1 mmol/L   Chloride 106 101 - 111 mmol/L   CO2 24 22 - 32 mmol/L   Glucose, Bld 96 65 - 99 mg/dL   BUN 13 6 - 20 mg/dL   Creatinine, Ser 0.72 0.44 - 1.00 mg/dL   Calcium 8.5 (L) 8.9 - 10.3 mg/dL   GFR calc non Af Amer >60 >60 mL/min   GFR calc Af Amer >60 >60 mL/min    Comment: (NOTE) The eGFR has been calculated using the CKD EPI equation. This calculation has not been validated in all clinical situations. eGFR's persistently <60 mL/min signify possible Chronic Kidney Disease.    Anion gap 7 5 - 15  Glucose, capillary      Status: None   Collection Time: 04/24/15  8:07 AM  Result Value Ref Range   Glucose-Capillary 87 65 - 99 mg/dL  Glucose, capillary     Status: None   Collection Time: 04/24/15 11:56 AM  Result Value Ref Range   Glucose-Capillary 88 65 - 99 mg/dL    Vitals: Blood pressure 140/91, pulse  72, temperature 98.4 F (36.9 C), temperature source Oral, resp. rate 20, height $RemoveBe'5\' 8"'VTfyjvQki$  (1.727 m), weight 79.9 kg (176 lb 2.4 oz), last menstrual period 03/06/2015, SpO2 98 %.  Risk to Self: Is patient at risk for suicide?: Yes Risk to Others:   Prior Inpatient Therapy:   Prior Outpatient Therapy:    Current Facility-Administered Medications  Medication Dose Route Frequency Provider Last Rate Last Dose  . heparin injection 5,000 Units  5,000 Units Subcutaneous 3 times per day Erick Colace, NP   5,000 Units at 04/24/15 1359  . insulin aspart (novoLOG) injection 0-5 Units  0-5 Units Subcutaneous QHS Venetia Maxon Rama, MD   0 Units at 04/23/15 2214  . insulin aspart (novoLOG) injection 0-9 Units  0-9 Units Subcutaneous TID WC Venetia Maxon Rama, MD   1 Units at 04/23/15 1123  . lip balm (CARMEX) ointment   Topical PRN Erick Colace, NP        Musculoskeletal: Strength & Muscle Tone: within normal limits and decreased Gait & Station: normal, unable to stand Patient leans: N/A  Psychiatric Specialty Exam: Physical Exam Kelsey per history and physical   ROS drowsy, tired, thirsty and hungry but denied nausea, vomiting, chest pain, shortness of breath. No Fever-chills, No Headache, No changes with Vision or hearing, reports vertigo No problems swallowing food or Liquids, No Chest pain, Cough or Shortness of Breath, No Abdominal pain, No Nausea or Vommitting, Bowel movements are regular, No Blood in stool or Urine, No dysuria, No new skin rashes or bruises, No new joints pains-aches,  No new weakness, tingling, numbness in any extremity, No recent weight gain or loss, No polyuria, polydypsia or  polyphagia,   A full 10 point Review of Systems was done, except Kelsey stated above, all other Review of Systems were negative.  Blood pressure 140/91, pulse 72, temperature 98.4 F (36.9 C), temperature source Oral, resp. rate 20, height $RemoveBe'5\' 8"'KPkxJjVts$  (1.727 m), weight 79.9 kg (176 lb 2.4 oz), last menstrual period 03/06/2015, SpO2 98 %.Body mass index is 26.79 kg/(m^2).  General Appearance: Casual  Eye Contact::  Good  Speech:  Clear and Coherent  Volume:  Normal  Mood:  Euthymic  Affect:  Appropriate  Thought Process:  Coherent and Goal Directed  Orientation:  Full (Time, Place, and Person)  Thought Content:  Negative  Suicidal Thoughts:  No  Homicidal Thoughts:  No  Memory:  Immediate;   Fair Recent;   Fair  Judgement:  Fair  Insight:  Fair  Psychomotor Activity:  Normal and Decreased  Concentration:  Good  Recall:  Good  Fund of Knowledge:Good  Language: Good  Akathisia:  Negative  Handed:  Right  AIMS (if indicated):     Assets:  Financial Resources/Insurance Housing Leisure Time Physical Health Resilience Talents/Skills Transportation  ADL's:  Intact  Cognition: WNL  Sleep:   fair   Medical Decision Making: Established Problem, Stable/Improving (1), Review of Medication Regimen & Side Effects (2) and Review of New Medication or Change in Dosage (2) Daily contact with patient to assess and evaluate symptoms and progress in treatment and Medication management  Plan: No evidence of imminent risk to self or others at present.   Patient does not meet criteria for psychiatric inpatient admission.  Appreciate psychiatric consultation and follow up Kelsey clinically required Please contact [phone] or [phone] if needs further assistance  Disposition: Patient should be referred to outpatient substance abuse rehabilitation treatment after medically cleared.  Corena Pilgrim, MD 04/24/2015  3:21 PM

## 2015-04-24 NOTE — Plan of Care (Signed)
Problem: Phase II Progression Outcomes Goal: Discharge plan established Outcome: Completed/Met Date Met:  04/24/15 IOP residential substance abuse rehab  Problem: Phase III Progression Outcomes Goal: Activity at appropriate level-compared to baseline (UP IN CHAIR FOR HEMODIALYSIS) Outcome: Completed/Met Date Met:  04/24/15 Walking halls with sitter

## 2015-04-24 NOTE — Clinical Social Work Note (Signed)
Clinical Social Work Assessment  Patient Details  Name: Kelsey [name] MRN: [id number] Date of Birth: Sep 18, 1971  Date of referral:  04/24/15               Reason for consult:  Substance Use/ETOH Abuse                Permission sought to share information with:    Permission granted to share information::     Name::        Agency::     Relationship::     Contact Information:     Housing/Transportation Living arrangements for the past 2 months:  Single Family Home Source of Information:  Patient Patient Interpreter Needed:    Criminal Activity/Legal Involvement Pertinent to Current Situation/Hospitalization:    Significant Relationships:  Parents Lives with:  Parents Do you feel safe going back to the place where you live?    Need for family participation in patient care:     Care giving concerns:  No caregiver   Facilities manager / plan:  CSW met with pt at bedside to assess for in patient alcohol related rehab.  CSW prompted pt to discuss history and needs.  CSW encouraged pt to explore alcohol use/abuse.  CSW discussed possible residential placements with pt. CSW placed a call to TTS to have psychiatrist re visit pt so that MD can discharge  Employment status:  Part-Time Insurance information:  Medicaid In [place] PT Recommendations:    Information / Referral to community resources:     Patient/Family's Response to care:  Pt discussed having 5 DWI's and dealing with an attorney at this time.  Pt stated that she needs to go for a DWI assessment (costs 100 dollars) and then turn that assessment into her attorney who will turn it into the courts.  Pt stated that there are many places for her to go for this assessment in town and she is aware of them.  She stated that she had appointment on Wednesday of last week but missed it.  Pt stated that she drinks a bottle of wine about 1 - days a weeks.  Pt stated that she has been to [facility] for 30 day rehab a couple of times  and [facility].  She has no therapist at this time but stated that when the assessment is complete she will be referred to treatment at that time.  She refused any services stating that she needs to complete the DWI assessment per the courts and she did not want to stray from this recommendation.  Her court date is July 12th and if she is in rehab her attorney will attend for her.    Patient/Family's Understanding of and Emotional Response to Diagnosis, Current Treatment, and Prognosis:  Pt appears to understand she needs help with her drinking.  Pt appears to understand that this hospital stay might have cost her her job  Emotional Assessment Appearance:  Appears stated age Attitude/Demeanor/Rapport:  Apprehensive Affect (typically observed):  Anxious Orientation:  Oriented to Self, Oriented to Place, Oriented to  Time, Oriented to Situation Alcohol / Substance use:    Psych involvement (Current and /or in the community):  Yes (Comment)  Discharge Needs  Concerns to be addressed:    Readmission within the last 30 days:    Current discharge risk:    Barriers to Discharge:      Caswell Corwin 04/24/2015, 4:49 PM

## 2015-04-24 NOTE — Progress Notes (Signed)
Progress Note   LATANZA PFEFFERKORN KGM:[id number] DOB: 1971-01-30 DOA: 04/21/2015 PCP: PROVIDER NOT IN SYSTEM   Brief Narrative:   Kelsey [name] is an 44 y.o. female with a PMH of alcohol abuse, depression, and suicide attempts who was admitted by the critical care team on 04/21/15 after intentionally overdosing on fluoxetine, hydrocodone, BuSpar, trazodone, Seroquel, bupropion and topiramate. She was hypotensive and unresponsive on admission and intubated for airway protection. She was successfully extubated 04/22/15. She was evaluated by the psychiatrist and not felt to meet criteria for psychiatric inpatient admission.  Assessment/Plan:   Principal Problem:   Intentional overdose of multiple pharmaceuticals with acute ventilatory dependent respiratory failure/hypotension with circulatory shock - Admitted by critical care team. Hydrated. - Intubated on admission. Extubated 04/22/15. - Medically stable for discharge.  - Awaiting final medical clearance from psychiatry for discharge, since they have recommended ongoing use of a Recruitment consultant.  Active Problems:   Neck swelling - CT of the head shows possible injury to the neck swelling about the thyroid gland. - Supportive care.    Polysubstance abuse/Psychoactive substance-induced mood disorder/Acute encephalopathy - CT of the head negative on admission.  - Evaluated by psychiatrist 04/22/15. - Not felt to meet criteria for inpatient psychiatric hospitalization.    Metabolic acidosis/ lactic acidosis / hyperchloremia - Non-anion gap in the setting of massive normal saline resuscitation causing hyperchloremia. - Stop IV fluids.    Hyperglycemia - Follow-up hemoglobin A1c. - Currently being managed with SSI, insulin sensitive scale every 4 hours. CBGs 124-164. - Continue SSI, sensitive scale, before meals and at bedtime.    Hypokalemia - Resolved with supplementation.    Normocytic anemia - Likely secondary to menstrual  losses in this 44 year old female. Would not workup further.    DVT Prophylaxis - Continue heparin.  Code Status: Full. Family Communication: No family currently at the bedside. Disposition Plan: Home 04/24/15 if she remains stable.   IV Access:    Peripheral IV   Procedures and diagnostic studies:   Dg Abd 1 View  04/21/2015   CLINICAL DATA:  Drug overdose  EXAM: ABDOMEN - 1 VIEW  COMPARISON:  None.  FINDINGS: NG tube in the stomach. Normal bowel gas pattern. No renal calculi. Bilateral pelvic calcifications most likely phleboliths. No acute bony abnormality.  IMPRESSION: Negative.   Electronically Signed   By: Marlan Palau M.D.   On: 04/21/2015 07:37   Ct Head Wo Contrast  04/21/2015   CLINICAL DATA:  Altered mental status.  Overdose.  EXAM: CT HEAD WITHOUT CONTRAST  TECHNIQUE: Contiguous axial images were obtained from the base of the skull through the vertex without intravenous contrast.  COMPARISON:  None.  FINDINGS: Ventricle size is normal. Negative for acute or chronic infarction. Negative for hemorrhage or fluid collection. Negative for mass or edema. No shift of the midline structures.  Calvarium is intact. Mucosal edema in the paranasal sinuses without air-fluid level. Pharyngeal edema related to intubation.  IMPRESSION: Normal CT of the head.   Electronically Signed   By: Marlan Palau M.D.   On: 04/21/2015 08:01   Ct Soft Tissue Neck W Contrast  04/21/2015   ADDENDUM REPORT: 04/21/2015 08:37  ADDENDUM: These results were called by telephone at the time of interpretation on 04/21/2015 at 8:36 am to Dr. Effie Shy , who verbally acknowledged these results.   Electronically Signed   By: Marlan Palau M.D.   On: 04/21/2015 08:37   04/21/2015   CLINICAL DATA:  Overdose.  Post intubation. Left neck swelling post intubation.  EXAM: CT NECK WITH CONTRAST  TECHNIQUE: Multidetector CT imaging of the neck was performed using the standard protocol following the bolus administration of  intravenous contrast.  CONTRAST:  80mL OMNIPAQUE IOHEXOL 300 MG/ML  SOLN  COMPARISON:  None.  FINDINGS: Pharynx and larynx: The patient is intubated. Endotracheal tube extends into the trachea. The endotracheal tube balloon is inflated just below the larynx. The hyoid bone is mildly displaced to the left. The thyroid cartilage is more significantly displaced to the left raising the possibility of thyrohyoid ligament disruption. Larynx is displaced to the left of midline significantly. Arytenoid cartilage in normal position bilaterally. No gas in the soft tissues. NG tube in the esophagus. Pharyngeal edema is present surrounding the endotracheal tube. There is fluid in the nasopharynx.  Salivary glands: Negative  Thyroid: Thyroid is diffusely abnormal bilaterally with multiple linear areas of low-density throughout the gland. There is fluid density surrounding the thyroid gland bilaterally. Etiology is not clear but this may be related to trauma from intubation or neck manipulation. Thyroiditis less likely.  Lymph nodes: No adenopathy  Vascular: Carotid artery patent bilaterally without stenosis. Jugular vein patent bilaterally.  Limited intracranial: Negative  Visualized orbits: Negative  Mastoids and visualized paranasal sinuses: Mucosal edema in the ethmoid sinuses. Mastoid sinus clear bilaterally.  Skeleton: Negative  Upper chest: Lung apices clear  IMPRESSION: Endotracheal tube extends into the trachea. NG tube in the esophagus.  The hyoid bone is mildly displaced to the left. There is more significant displacement of the thyroid cartilage to the left. This raises the possibility of thyrohyoid ligament disruption or cartilage injury.  Diffuse edema throughout the thyroid gland with fluid surrounding the thyroid gland bilaterally, question trauma to the thyroid gland.  Electronically Signed: By: Marlan Palau M.D. On: 04/21/2015 08:29   Dg Chest Port 1 View  04/22/2015   CLINICAL DATA:  Intubation.  EXAM:  PORTABLE CHEST - 1 VIEW  COMPARISON:  04/21/2015.  FINDINGS: Endotracheal tube and NG tube in stable position. Mediastinum hilar structures are normal. Lungs are clear. Heart size normal. No pleural effusion or pneumothorax.  IMPRESSION: 1. Lines and tubes in stable position. 2. No acute cardiopulmonary disease.   Electronically Signed   By: Maisie Fus  Register   On: 04/22/2015 07:05   Dg Chest Portable 1 View  04/21/2015   CLINICAL DATA:  Intubation drug overdose  EXAM: PORTABLE CHEST - 1 VIEW  COMPARISON:  04/21/2015  FINDINGS: Endotracheal tube in good position.  NG tube enters the stomach.  Hypoventilation with decreased lung volume compared with earlier study. Mild left lower lobe atelectasis. Negative for pneumonia or effusion  IMPRESSION: Endotracheal tube in good position.  NG tube in the stomach  Mild left lower lobe atelectasis secondary to hypoventilation.   Electronically Signed   By: Marlan Palau M.D.   On: 04/21/2015 07:45   Dg Chest Port 1 View  04/21/2015   CLINICAL DATA:  Altered mental status.  Unresponsive  EXAM: PORTABLE CHEST - 1 VIEW  COMPARISON:  None.  FINDINGS: The heart size and mediastinal contours are within normal limits. Both lungs are clear. The visualized skeletal structures are unremarkable.  IMPRESSION: No active disease.   Electronically Signed   By: Marlan Palau M.D.   On: 04/21/2015 06:58     Medical Consultants:    Psychiatry  Anti-Infectives:    None.  Subjective:   DEAMBER BUCKHALTER feels well and denies feeling suicidal or depressed  at this time. No nausea, vomiting, dyspnea or pain.  Objective:    Filed Vitals:   04/23/15 1200 04/23/15 1400 04/23/15 2130 04/24/15 0530  BP: 151/102 134/86 132/89 124/91  Pulse: 76 79 76 72  Temp: 98.1 F (36.7 C) 98.3 F (36.8 C) 98.5 F (36.9 C) 98.2 F (36.8 C)  TempSrc: Oral Oral Oral Oral  Resp: Height:      Weight:      SpO2: 98% 98% 99% 99%    Intake/Output Summary (Last 24  hours) at 04/24/15 0742 Last data filed at 04/24/15 0615  Gross per 24 hour  Intake 2121.67 ml  Output   5475 ml  Net -3353.33 ml    Exam: Gen:  NAD Psych: Mood stable Cardiovascular:  RRR, No M/R/G Respiratory:  Lungs CTAB Gastrointestinal:  Abdomen soft, NT/ND, + BS Extremities:  No C/E/C   Data Reviewed:    Labs: Basic Metabolic Panel:  Recent Labs Lab 04/21/15 0730 04/21/15 0947 04/21/15 2015 04/22/15 0335 04/24/15 0424  NA 144 144 143 142 137  K 3.3* 3.2* 4.0 3.8 3.7  CL 105 109 116* 117* 106  CO2  --  26 18* 19* 24  GLUCOSE 237* 167* 151* 161* 96  BUN CREATININE 1.80* 1.08* 0.80 0.74 0.72  CALCIUM  --  8.7* 8.7* 8.5* 8.5*   GFR Estimated Creatinine Clearance: 100.6 mL/min (by C-G formula based on Cr of 0.72). Liver Function Tests:  Recent Labs Lab 04/21/15 0641 04/21/15 0947  AST 27 25  ALT 24 23  ALKPHOS 39 38  BILITOT 0.5 1.0  PROT 6.9 5.9*  ALBUMIN 3.9 3.5    Recent Labs Lab 04/21/15 0641  LIPASE 55*   Coagulation profile  Recent Labs Lab 04/21/15 0641 04/21/15 0947  INR 0.93 1.00    CBC:  Recent Labs Lab 04/21/15 0641 04/21/15 0730 04/22/15 0500  WBC 5.0  --  11.4*  NEUTROABS 2.7  --   --   HGB 10.5* 10.5* 9.7*  HCT 31.8* 31.0* 28.6*  MCV 97.0  --  97.9  PLT 146*  --  163   CBG:  Recent Labs Lab 04/23/15 0339 04/23/15 0739 04/23/15 1116 04/23/15 1656 04/23/15 2131  GLUCAP 134* 120* 126* 116* 104*   Hgb A1c: No results for input(s): HGBA1C in the last 72 hours.  Anemia work up:  Recent Labs  04/21/15 2015  IRON 44   Sepsis Labs:  Recent Labs Lab 04/21/15 0641 04/21/15 0731 04/21/15 0947 04/22/15 0335 04/22/15 0500 04/23/15 0351  PROCALCITON  --   --  <0.10 <0.10  --  <0.10  WBC 5.0  --   --   --  11.4*  --   LATICACIDVEN  --  2.72* 2.7*  --   --   --    Microbiology Recent Results (from the past 240 hour(s))  MRSA PCR Screening     Status: None   Collection Time: 04/21/15  11:14 AM  Result Value Ref Range Status   MRSA by PCR NEGATIVE NEGATIVE Final    Comment:        The GeneXpert MRSA Assay (FDA approved for NASAL specimens only), is one component of a comprehensive MRSA colonization surveillance program. It is not intended to diagnose MRSA infection nor to guide or monitor treatment for MRSA infections.   Culture, respiratory (tracheal aspirate)     Status: None (Preliminary result)   Collection Time: 04/21/15  2:00 PM  Result Value Ref Range Status   Specimen Description TRACHEAL ASPIRATE  Final   Special Requests NONE  Final   Gram Stain   Final    FEW WBC PRESENT,BOTH PMN AND MONONUCLEAR NO SQUAMOUS EPITHELIAL CELLS SEEN NO ORGANISMS SEEN Performed at [facility]    Culture   Final    Culture reincubated for better growth Performed at [facility]    Report Status PENDING  Incomplete     Medications:   . heparin  5,000 Units Subcutaneous 3 times per day  . insulin aspart  0-5 Units Subcutaneous QHS  . insulin aspart  0-9 Units Subcutaneous TID WC   Continuous Infusions:    Time spent: 25 minutes.   LOS: 3 days   RAMA,CHRISTINA  [facility] Pager [phone]. If unable to reach me by pager, please call my cell phone at [phone].  *Please refer to [email or web address], password [email or web address] to get updated schedule on who will round on this patient, as hospitalists switch teams weekly. If 7PM-7AM, please contact night-coverage at [email or web address], password [email or web address] for any overnight needs.  04/24/2015, 7:42 AM

## 2015-04-24 NOTE — Discharge Instructions (Signed)
Dysphoria Dysphoria is a condition in which a person feels unpleasant or uncomfortable.  CAUSES  Dysphoria has many possible causes, including:  Anxiety disorders.  Psychiatric disorders.  Withdrawal from drugs or alcohol.  Mood disorders.  Manic disorders.  Transgender disorders.  Reactions to medications.  Personality disorders.  Body dysmorphic disorders. SYMPTOMS  It may include mood changes such as:  Sadness.  Anxiety.  Irritability.  Restlessness. It may just be a feeling of wanting to crawl out of your skin. TREATMENT  You can discuss the treatment of how you feel with your caregiver. Once they have diagnosed the cause, they can treat it. Document Released: 04/02/2006 Document Revised: 01/14/2012 Document Reviewed: 08/08/2006 [facility] Patient Information 2015 [place], [place]. This information is not intended to replace advice given to you by your health care provider. Make sure you discuss any questions you have with your health care provider.  Hyperglycemia Hyperglycemia occurs when the glucose (sugar) in your blood is too high. Hyperglycemia can happen for many reasons, but it most often happens to people who do not know they have diabetes or are not managing their diabetes properly.  CAUSES  Whether you have diabetes or not, there are other causes of hyperglycemia. Hyperglycemia can occur when you have diabetes, but it can also occur in other situations that you might not be as aware of, such as: Diabetes  If you have diabetes and are having problems controlling your blood glucose, hyperglycemia could occur because of some of the following reasons:  Not following your meal plan.  Not taking your diabetes medications or not taking it properly.  Exercising less or doing less activity than you normally do.  Being sick. Pre-diabetes  This cannot be ignored. Before people develop Type 2 diabetes, they almost always have "pre-diabetes." This is when your  blood glucose levels are higher than normal, but not yet high enough to be diagnosed as diabetes. Research has shown that some long-term damage to the body, especially the heart and circulatory system, may already be occurring during pre-diabetes. If you take action to manage your blood glucose when you have pre-diabetes, you may delay or prevent Type 2 diabetes from developing. Stress  If you have diabetes, you may be "diet" controlled or on oral medications or insulin to control your diabetes. However, you may find that your blood glucose is higher than usual in the hospital whether you have diabetes or not. This is often referred to as "stress hyperglycemia." Stress can elevate your blood glucose. This happens because of hormones put out by the body during times of stress. If stress has been the cause of your high blood glucose, it can be followed regularly by your caregiver. That way he/she can make sure your hyperglycemia does not continue to get worse or progress to diabetes. Steroids  Steroids are medications that act on the infection fighting system (immune system) to block inflammation or infection. One side effect can be a rise in blood glucose. Most people can produce enough extra insulin to allow for this rise, but for those who cannot, steroids make blood glucose levels go even higher. It is not unusual for steroid treatments to "uncover" diabetes that is developing. It is not always possible to determine if the hyperglycemia will go away after the steroids are stopped. A special blood test called an A1c is sometimes done to determine if your blood glucose was elevated before the steroids were started. SYMPTOMS  Thirsty.  Frequent urination.  Dry mouth.  Blurred vision.  Tired or fatigue.  Weakness.  Sleepy.  Tingling in feet or leg. DIAGNOSIS  Diagnosis is made by monitoring blood glucose in one or all of the following ways:  A1c test. This is a chemical found in your  blood.  Fingerstick blood glucose monitoring.  Laboratory results. TREATMENT  First, knowing the cause of the hyperglycemia is important before the hyperglycemia can be treated. Treatment may include, but is not be limited to:  Education.  Change or adjustment in medications.  Change or adjustment in meal plan.  Treatment for an illness, infection, etc.  More frequent blood glucose monitoring.  Change in exercise plan.  Decreasing or stopping steroids.  Lifestyle changes. HOME CARE INSTRUCTIONS   Test your blood glucose as directed.  Exercise regularly. Your caregiver will give you instructions about exercise. Pre-diabetes or diabetes which comes on with stress is helped by exercising.  Eat wholesome, balanced meals. Eat often and at regular, fixed times. Your caregiver or nutritionist will give you a meal plan to guide your sugar intake.  Being at an ideal weight is important. If needed, losing as little as 10 to 15 pounds may help improve blood glucose levels. SEEK MEDICAL CARE IF:   You have questions about medicine, activity, or diet.  You continue to have symptoms (problems such as increased thirst, urination, or weight gain). SEEK IMMEDIATE MEDICAL CARE IF:   You are vomiting or have diarrhea.  Your breath smells fruity.  You are breathing faster or slower.  You are very sleepy or incoherent.  You have numbness, tingling, or pain in your feet or hands.  You have chest pain.  Your symptoms get worse even though you have been following your caregiver's orders.  If you have any other questions or concerns. Document Released: 04/17/2001 Document Revised: 01/14/2012 Document Reviewed: 02/18/2012 [facility] Patient Information 2015 [place], [place]. This information is not intended to replace advice given to you by your health care provider. Make sure you discuss any questions you have with your health care provider.

## 2015-04-24 NOTE — Clinical Social Work Note (Signed)
The following in patient rehab for alcohol were contacted for the pt:  [facility]:  Left message  [facility]:  Only accept pt's on weekdays  Can fax tomorrow to [phone]  [facility]: 2 month waiting list can fax to [phone]  [facility]:  They only do intakes on week days might have a bed Wed. Fax info to [phone]  .Elray Buba, LCSW [facility] Clinical Social Worker - Weekend Coverage cell #: [phone]

## 2015-04-25 LAB — HEMOGLOBIN A1C
Hgb A1c MFr Bld: 5.4 % (ref 4.8–5.6)
Mean Plasma Glucose: 108 mg/dL

## 2015-05-04 ENCOUNTER — Inpatient Hospital Stay (HOSPITAL_COMMUNITY)
Admission: EM | Admit: 2015-05-04 | Discharge: 2015-05-05 | DRG: 682 | Disposition: A | Discharge status: Home / Self Care | Payer: Medicaid Other | Attending: Internal Medicine | Admitting: Internal Medicine

## 2015-05-04 ENCOUNTER — Emergency Department (HOSPITAL_COMMUNITY): Payer: Medicaid Other

## 2015-05-04 ENCOUNTER — Encounter (HOSPITAL_COMMUNITY): Payer: Self-pay | Admitting: Emergency Medicine

## 2015-05-04 DIAGNOSIS — F10229 Alcohol dependence with intoxication, unspecified: Secondary | ICD-10-CM | POA: Diagnosis present

## 2015-05-04 DIAGNOSIS — Z915 Personal history of self-harm: Secondary | ICD-10-CM | POA: Diagnosis not present

## 2015-05-04 DIAGNOSIS — Y908 Blood alcohol level of 240 mg/100 ml or more: Secondary | ICD-10-CM | POA: Diagnosis present

## 2015-05-04 DIAGNOSIS — F329 Major depressive disorder, single episode, unspecified: Secondary | ICD-10-CM | POA: Diagnosis present

## 2015-05-04 DIAGNOSIS — R45851 Suicidal ideations: Secondary | ICD-10-CM

## 2015-05-04 DIAGNOSIS — D638 Anemia in other chronic diseases classified elsewhere: Secondary | ICD-10-CM | POA: Diagnosis present

## 2015-05-04 DIAGNOSIS — G9349 Other encephalopathy: Secondary | ICD-10-CM | POA: Diagnosis present

## 2015-05-04 DIAGNOSIS — Z79899 Other long term (current) drug therapy: Secondary | ICD-10-CM

## 2015-05-04 DIAGNOSIS — G9340 Encephalopathy, unspecified: Secondary | ICD-10-CM

## 2015-05-04 DIAGNOSIS — I959 Hypotension, unspecified: Secondary | ICD-10-CM | POA: Diagnosis present

## 2015-05-04 DIAGNOSIS — E870 Hyperosmolality and hypernatremia: Secondary | ICD-10-CM | POA: Diagnosis present

## 2015-05-04 DIAGNOSIS — E860 Dehydration: Secondary | ICD-10-CM | POA: Diagnosis present

## 2015-05-04 DIAGNOSIS — F10129 Alcohol abuse with intoxication, unspecified: Secondary | ICD-10-CM | POA: Diagnosis not present

## 2015-05-04 DIAGNOSIS — Z7982 Long term (current) use of aspirin: Secondary | ICD-10-CM

## 2015-05-04 DIAGNOSIS — F1020 Alcohol dependence, uncomplicated: Secondary | ICD-10-CM

## 2015-05-04 DIAGNOSIS — N178 Other acute kidney failure: Principal | ICD-10-CM | POA: Diagnosis present

## 2015-05-04 DIAGNOSIS — N179 Acute kidney failure, unspecified: Secondary | ICD-10-CM

## 2015-05-04 DIAGNOSIS — F10929 Alcohol use, unspecified with intoxication, unspecified: Secondary | ICD-10-CM | POA: Diagnosis present

## 2015-05-04 DIAGNOSIS — R4182 Altered mental status, unspecified: Secondary | ICD-10-CM | POA: Diagnosis present

## 2015-05-04 DIAGNOSIS — IMO0002 Reserved for concepts with insufficient information to code with codable children: Secondary | ICD-10-CM

## 2015-05-04 DIAGNOSIS — E871 Hypo-osmolality and hyponatremia: Secondary | ICD-10-CM

## 2015-05-04 LAB — CBC WITH DIFFERENTIAL/PLATELET
Basophils Absolute: 0 10*3/uL (ref 0.0–0.1)
Basophils Absolute: 0 10*3/uL (ref 0.0–0.1)
Basophils Relative: 1 % (ref 0–1)
Basophils Relative: 0 % (ref 0–1)
Eosinophils Absolute: 0 10*3/uL (ref 0.0–0.7)
Eosinophils Absolute: 0 10*3/uL (ref 0.0–0.7)
Eosinophils Relative: 1 % (ref 0–5)
Eosinophils Relative: 0 % (ref 0–5)
HCT: 31 % — ABNORMAL LOW (ref 36.0–46.0)
HCT: 29.3 % — ABNORMAL LOW (ref 36.0–46.0)
Hemoglobin: 10.1 g/dL — ABNORMAL LOW (ref 12.0–15.0)
Hemoglobin: 9.7 g/dL — ABNORMAL LOW (ref 12.0–15.0)
Lymphocytes Relative: 40 % (ref 12–46)
Lymphocytes Relative: 23 % (ref 12–46)
Lymphs Abs: 2 10*3/uL (ref 0.7–4.0)
Lymphs Abs: 1.5 10*3/uL (ref 0.7–4.0)
MCH: 32 pg (ref 26.0–34.0)
MCH: 32.3 pg (ref 26.0–34.0)
MCHC: 32.6 g/dL (ref 30.0–36.0)
MCHC: 33.1 g/dL (ref 30.0–36.0)
MCV: 98.1 fL (ref 78.0–100.0)
MCV: 97.7 fL (ref 78.0–100.0)
Monocytes Absolute: 0.4 10*3/uL (ref 0.1–1.0)
Monocytes Absolute: 0.2 10*3/uL (ref 0.1–1.0)
Monocytes Relative: 7 % (ref 3–12)
Monocytes Relative: 3 % (ref 3–12)
Neutro Abs: 2.7 10*3/uL (ref 1.7–7.7)
Neutro Abs: 4.9 10*3/uL (ref 1.7–7.7)
Neutrophils Relative %: 51 % (ref 43–77)
Neutrophils Relative %: 74 % (ref 43–77)
Platelets: 278 10*3/uL (ref 150–400)
Platelets: 260 10*3/uL (ref 150–400)
RBC: 3.16 MIL/uL — ABNORMAL LOW (ref 3.87–5.11)
RBC: 3 MIL/uL — ABNORMAL LOW (ref 3.87–5.11)
RDW: 15.9 % — ABNORMAL HIGH (ref 11.5–15.5)
RDW: 15.9 % — ABNORMAL HIGH (ref 11.5–15.5)
WBC: 5.1 10*3/uL (ref 4.0–10.5)
WBC: 6.7 10*3/uL (ref 4.0–10.5)

## 2015-05-04 LAB — SALICYLATE LEVEL: Salicylate Lvl: <4 mg/dL (ref 2.8–30.0)

## 2015-05-04 LAB — I-STAT CHEM 8, ED
BUN: 6 mg/dL (ref 6–20)
Calcium, Ion: 1.09 mmol/L — ABNORMAL LOW (ref 1.12–1.23)
Chloride: 114 mmol/L — ABNORMAL HIGH (ref 101–111)
Creatinine, Ser: 1.8 mg/dL — ABNORMAL HIGH (ref 0.44–1.00)
Glucose, Bld: 90 mg/dL (ref 65–99)
HCT: 34 % — ABNORMAL LOW (ref 36.0–46.0)
Hemoglobin: 11.6 g/dL — ABNORMAL LOW (ref 12.0–15.0)
Potassium: 3.9 mmol/L (ref 3.5–5.1)
Sodium: 150 mmol/L — ABNORMAL HIGH (ref 135–145)
TCO2: 23 mmol/L (ref 0–100)

## 2015-05-04 LAB — APTT: aPTT: 29 seconds (ref 24–37)

## 2015-05-04 LAB — AMMONIA: Ammonia: 23 umol/L (ref 9–35)

## 2015-05-04 LAB — MAGNESIUM: Magnesium: 1.7 mg/dL (ref 1.7–2.4)

## 2015-05-04 LAB — COMPREHENSIVE METABOLIC PANEL
ALT: 18 U/L (ref 14–54)
ALT: 18 U/L (ref 14–54)
AST: 23 U/L (ref 15–41)
AST: 26 U/L (ref 15–41)
Albumin: 3.8 g/dL (ref 3.5–5.0)
Albumin: 3.4 g/dL — ABNORMAL LOW (ref 3.5–5.0)
Alkaline Phosphatase: 34 U/L — ABNORMAL LOW (ref 38–126)
Alkaline Phosphatase: 30 U/L — ABNORMAL LOW (ref 38–126)
Anion gap: 7 (ref 5–15)
Anion gap: 7 (ref 5–15)
BUN: 8 mg/dL (ref 6–20)
BUN: 9 mg/dL (ref 6–20)
CO2: 25 mmol/L (ref 22–32)
CO2: 25 mmol/L (ref 22–32)
Calcium: 8.1 mg/dL — ABNORMAL LOW (ref 8.9–10.3)
Calcium: 7.8 mg/dL — ABNORMAL LOW (ref 8.9–10.3)
Chloride: 118 mmol/L — ABNORMAL HIGH (ref 101–111)
Chloride: 112 mmol/L — ABNORMAL HIGH (ref 101–111)
Creatinine, Ser: 0.72 mg/dL (ref 0.44–1.00)
Creatinine, Ser: 0.67 mg/dL (ref 0.44–1.00)
GFR calc Af Amer: >60 mL/min (ref >60)
GFR calc Af Amer: >60 mL/min (ref >60)
GFR calc non Af Amer: >60 mL/min (ref >60)
GFR calc non Af Amer: >60 mL/min (ref >60)
Glucose, Bld: 93 mg/dL (ref 65–99)
Glucose, Bld: 95 mg/dL (ref 65–99)
Potassium: 3.9 mmol/L (ref 3.5–5.1)
Potassium: 3.6 mmol/L (ref 3.5–5.1)
Sodium: 150 mmol/L — ABNORMAL HIGH (ref 135–145)
Sodium: 144 mmol/L (ref 135–145)
Total Bilirubin: 0.5 mg/dL (ref 0.3–1.2)
Total Bilirubin: 0.5 mg/dL (ref 0.3–1.2)
Total Protein: 6.4 g/dL — ABNORMAL LOW (ref 6.5–8.1)
Total Protein: 6.1 g/dL — ABNORMAL LOW (ref 6.5–8.1)

## 2015-05-04 LAB — PROTIME-INR
INR: 1.03 (ref 0.00–1.49)
Prothrombin Time: 13.7 seconds (ref 11.6–15.2)

## 2015-05-04 LAB — I-STAT CG4 LACTIC ACID, ED: Lactic Acid, Venous: 1.22 mmol/L (ref 0.5–2.0)

## 2015-05-04 LAB — ETHANOL: Alcohol, Ethyl (B): 446 mg/dL (ref <5)

## 2015-05-04 LAB — PHOSPHORUS: Phosphorus: 4.3 mg/dL (ref 2.5–4.6)

## 2015-05-04 LAB — TSH: TSH: 0.156 u[IU]/mL — ABNORMAL LOW (ref 0.350–4.500)

## 2015-05-04 LAB — ACETAMINOPHEN LEVEL: Acetaminophen (Tylenol), Serum: <10 ug/mL — ABNORMAL LOW (ref 10–30)

## 2015-05-04 LAB — LIPASE, BLOOD: Lipase: 584 U/L — ABNORMAL HIGH (ref 22–51)

## 2015-05-04 MED ORDER — LORAZEPAM 2 MG/ML IJ SOLN
1.0000 mg | Freq: Four times a day (QID) | INTRAMUSCULAR | Status: DC | PRN
  Administered 2015-05-04: 1 mg via INTRAVENOUS
  Filled 2015-05-04: qty 1

## 2015-05-04 MED ORDER — SODIUM CHLORIDE 0.45 % IV SOLN
INTRAVENOUS | Status: DC
  Administered 2015-05-04: 150 mL/h via INTRAVENOUS

## 2015-05-04 MED ORDER — THIAMINE HCL 100 MG/ML IJ SOLN
Freq: Once | INTRAVENOUS | Status: DC
  Filled 2015-05-04: qty 1000

## 2015-05-04 MED ORDER — SODIUM CHLORIDE 0.9 % IJ SOLN: 3.0000 mL | Freq: Two times a day (BID) | INTRAMUSCULAR | Status: DC

## 2015-05-04 MED ORDER — ONDANSETRON HCL 4 MG/2ML IJ SOLN
4.0000 mg | Freq: Four times a day (QID) | INTRAMUSCULAR | Status: DC | PRN
  Administered 2015-05-04: 4 mg via INTRAVENOUS
  Filled 2015-05-04: qty 2

## 2015-05-04 MED ORDER — FOLIC ACID 1 MG PO TABS
1.0000 mg | ORAL_TABLET | Freq: Every day | ORAL | Status: DC
  Administered 2015-05-04 – 2015-05-05 (×2): 1 mg via ORAL
  Filled 2015-05-04 (×2): qty 1

## 2015-05-04 MED ORDER — SODIUM CHLORIDE 0.9 % IV BOLUS (SEPSIS): 1000.0000 mL | Freq: Once | INTRAVENOUS | Status: DC

## 2015-05-04 MED ORDER — THIAMINE HCL 100 MG/ML IJ SOLN: 100.0000 mg | Freq: Every day | INTRAMUSCULAR | Status: DC

## 2015-05-04 MED ORDER — LORAZEPAM 1 MG PO TABS: 1.0000 mg | ORAL_TABLET | Freq: Four times a day (QID) | ORAL | Status: DC | PRN

## 2015-05-04 MED ORDER — ADULT MULTIVITAMIN W/MINERALS CH
1.0000 | ORAL_TABLET | Freq: Every day | ORAL | Status: DC
  Administered 2015-05-04 – 2015-05-05 (×2): 1 via ORAL
  Filled 2015-05-04 (×2): qty 1

## 2015-05-04 MED ORDER — VITAMIN B-1 100 MG PO TABS
100.0000 mg | ORAL_TABLET | Freq: Every day | ORAL | Status: DC
  Administered 2015-05-04 – 2015-05-05 (×2): 100 mg via ORAL
  Filled 2015-05-04 (×2): qty 1

## 2015-05-04 MED ORDER — SODIUM CHLORIDE 0.9 % IV SOLN
INTRAVENOUS | Status: DC
Start: 2015-05-04 — End: 2015-05-05
  Administered 2015-05-04: 22:00:00 via INTRAVENOUS

## 2015-05-04 MED ORDER — ONDANSETRON HCL 4 MG PO TABS: 4.0000 mg | ORAL_TABLET | Freq: Four times a day (QID) | ORAL | Status: DC | PRN

## 2015-05-04 MED ORDER — ACETAMINOPHEN 325 MG PO TABS: 975.0000 mg | ORAL_TABLET | Freq: Once | ORAL | Status: DC

## 2015-05-04 NOTE — Progress Notes (Signed)
05/04/15 1848 Called to get report from ED. [facility] placed on hold, [facility]. Held for 8 minutes to get report. No one ever came back to phone.

## 2015-05-04 NOTE — ED Notes (Signed)
Resting quietly with eye closed. Easily arousable. Verbally responsive. Resp even and unlabored. ABC's intact. No behavior problems noted. NAD noted. IV infusing NS at 14550ml/hr without difficulty. Family at bedside.

## 2015-05-04 NOTE — ED Notes (Signed)
[facility] Expected date:  Expected time:  Means of arrival:  Comments: EMS- ETOH intoxication

## 2015-05-04 NOTE — ED Notes (Signed)
Resting quietly with eye closed. Easily arousable. Verbally responsive. Resp even and unlabored. ABC's intact. No behavior problems noted. Pt had n/v episode x1. IV continues to infuse NS without difficulty.

## 2015-05-04 NOTE — ED Notes (Signed)
Patient transported to CT 

## 2015-05-04 NOTE — ED Provider Notes (Signed)
Medical screening examination/treatment/procedure(s) were conducted as a shared visit with non-physician practitioner(s) and myself.  I personally evaluated the patient during the encounter.   EKG Interpretation None     Patient here with increased weakness in public intoxication. Electrolytes shows hypernatremia and acute kidney injury. Will be admitted to medicine  Lorre NickAnthony Allen, MD 05/04/15 1615

## 2015-05-04 NOTE — H&P (Addendum)
[facility] History and Physical  Kelsey BarryVictoria K Shaneyfelt WUJ:[id number] DOB: 07/03/1971 DOA: 05/04/2015  Referring physician: ER physician: Dr. Lorre NickAnthony Allen PCP: Salli RealSUN,YUN, MD  Chief Complaint: Altered mental status  HPI:  44 year old female with past medical history of alcohol abuse, history of depression, has 1 child who is 43 years of age with Mayford KnifeWilliams syndrome and he is in his father's custody. Patient's mother at the bedside and told me that [place] has a chronic problem with drinking and nothing that they have tried so far work. She temporarily gets better but then goes back to drinking. Her mother reports she's probably trying to end her life but she is not sure what is causing her to be like this. She says that ever since she was a child she had a troubled personality. Her ongoing drinking has led to frequent job losses and ultimately not being able to take care of her child. Patient is not able to provide any history because of lethargy. Mother found her in the car at around 6511 AM. Apparently she stayed in the driveway for 1 hour and was unresponsive when her mother saw her. In ER, patient was slightly hypotensive, 85/45 but blood pressure improved with IV fluids 211/76. Blood work was significant for hemoglobin of 10.1, sodium 150, chloride 118, initially normal creatinine but on repeat blood work it was 1.8. CT head without show acute intracranial abnormalities. Chest x-ray showed no acute cardiopulmonary disease. Alcohol level, lipase level, urine drug screen are pending at this time. Due to possible alcohol intoxication, worsening in mental status we will admit to step down unit for first 24 hours for monitoring.   Assessment & Plan    Principal problem: Acute encephalopathy / acute alcohol intoxication / alcohol dependence and abuse - Likely secondary to alcohol intoxication although we don't have alcohol level result available at this time. - Her mental status is such that she  is lethargic and unable to provide any history - We will continue IV fluids, supportive care - Ammonia level was within normal limits. - Admission to stepdown unit  Active Problems: Possible suicidal ideations / suicidal attempt - Patient is lethargic at this time, not verbal. While monitoring in step down unit if she becomes more agitated and then will get a sitter at the bedside.  - Obtain psychiatry consult tomorrow if patient is more alert.  Hypernatremia - Likely dehydration from alcohol abuse / use - Continue IV fluids - Follow-up BMP tomorrow morning  Acute renal failure - Likely from acute alcohol intoxication - Check urine drug screen - Continue IV fluids - Follow-up BMP tomorrow morning  Anemia of chronic disease - Likely from bone marrow suppression from alcohol abuse - Hemoglobin is 10.1 - No current indications for transfusion    DVT prophylaxis:  - SCDs bilaterally  Radiological Exams on Admission: No results found.  EKG: I have personally reviewed EKG. EKG shows sinus rhythm  Code Status: Full Family Communication: Plan of care discussed with the patient's  mother at the bedside  Disposition Plan: Admit for further evaluationStepdown unit ;   Manson PasseyEVINE, ALMA, MD  [facility] Pager [phone]  Time spent in minutes: 75 minutes  Review of Systems:  Unable to obtain because of patient's altered mental status and lethargy  Past Medical History  Diagnosis Date  . Alcohol abuse     History of 5 DWIs.  . Depression   . Suicide attempt   . Intentional overdose of multiple pharmaceuticals 04/21/2015  . Normocytic anemia  04/23/2015  . Acute ventilatory dependent respiratory failure 04/23/2015  . 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 Cosmetic surgery     Social History:  reports that she has never smoked. She does not have any smokeless tobacco history on file. She reports that she  drinks about 18.0 oz of alcohol per week. She reports that she does not use illicit drugs.  No Known Allergies  Family History: Williams syndrome in her son   Prior to Admission medications   Medication Sig Start Date End Date Taking? Authorizing Provider  aspirin EC 81 MG tablet Take 1 tablet (81 mg total) by mouth daily. For heart health Patient not taking: Reported on 10/08/2014 05/18/14   Sanjuana Kava, NP  ferrous sulfate 325 (65 FE) MG tablet Take 1 tablet (325 mg total) by mouth daily with breakfast. For low iron Patient not taking: Reported on 10/08/2014 05/18/14   Sanjuana Kava, NP  Multiple Vitamin (MULTIVITAMIN WITH MINERALS) TABS tablet Take 1 tablet by mouth daily. For low vitamin Patient not taking: Reported on 05/04/2015 05/18/14   Sanjuana Kava, NP   Physical Exam: Filed Vitals:   05/04/15 1429 05/04/15 1500  BP: 95/59   Pulse: 92   Temp: 98.6 F (37 C)   TempSrc: Oral   Resp: 14   SpO2: 98% 95%    Physical Exam  Constitutional: Patient is lethargic, non-verbal at this time, responds to painful stimuli by moving extremities, does not respond to verbal stimuli HENT: Normocephalic. His able to visualize, no throat erythema Eyes: Conjunctivae are normal. No scleral icterus.  Neck: Normal ROM. Neck supple. No JVD. No tracheal deviation. No thyromegaly.  CVS: Tachycardic, S1/S2 +, no murmurs, no gallops, no carotid bruit.  Pulmonary: Effort and breath sounds normal, no stridor, rhonchi, wheezes, rales.  Abdominal: Soft. BS +,  no distension, tenderness, rebound or guarding.  Musculoskeletal: Patient lethargic and unable to test range of motion. No edema and no tenderness.  Lymphadenopathy: No lymphadenopathy noted, cervical, inguinal. Neuro: Patient lethargic, normal reflexes Skin: Skin is warm and dry. No rash noted.  No erythema. No pallor.  Psychiatric: Unable to test because of patient's altered mental status   Labs on Admission:  Basic Metabolic Panel:  Recent  Labs Lab 05/04/15 1548  NA 150*  K 3.9  CL 114*  GLUCOSE 90  BUN 6  CREATININE 1.80*   Liver Function Tests: No results for input(s): AST, ALT, ALKPHOS, BILITOT, PROT, ALBUMIN in the last 168 hours. No results for input(s): LIPASE, AMYLASE in the last 168 hours. No results for input(s): AMMONIA in the last 168 hours. CBC:  Recent Labs Lab 05/04/15 1543 05/04/15 1548  WBC 5.1  --   NEUTROABS 2.7  --   HGB 10.1* 11.6*  HCT 31.0* 34.0*  MCV 98.1  --   PLT 278  --    Cardiac Enzymes: No results for input(s): CKTOTAL, CKMB, CKMBINDEX, TROPONINI in the last 168 hours. BNP: Invalid input(s): POCBNP CBG: No results for input(s): GLUCAP in the last 168 hours.  If 7PM-7AM, please contact night-coverage [email or web address] Password [email or web address] 05/04/2015, 4:20 PM

## 2015-05-04 NOTE — ED Notes (Signed)
Pt arrived via EMS with report of finding pt in front yard. Pt had consume 3-5 bottles of cooking wine.

## 2015-05-04 NOTE — Progress Notes (Signed)
Arrived to unit bed 1224, patient vomiting after placing fingers in the back of throat to make herself vomited. Agitated with staff when staff member stopped this.

## 2015-05-04 NOTE — ED Notes (Signed)
Resting quietly with eye closed. Easily arousable. Verbally responsive. Resp even and unlabored. ABC's intact. No behavior problems noted. IV infusing NS at 16550ml/hr without difficulty.

## 2015-05-04 NOTE — ED Provider Notes (Signed)
CSN: [id number]     Arrival date & time 05/04/15  1416 History   None    Chief Complaint  Patient presents with  . Altered Mental Status     (Consider location/radiation/quality/duration/timing/severity/associated sxs/prior Treatment) Patient is a 44 y.o. female presenting with altered mental status.  Altered Mental Status    Blood pressure 95/59, pulse 92, temperature 98.6 F (37 C), temperature source Oral, resp. rate 14, SpO2 95 %.  Kelsey [name] is a 44 y.o. female accompanied by mother complaining of intoxication and possible suicide attempt. Patient was in her car, she was heading to work this morning at around 11 AM, she stayed in the driveway for an hour or so, her mother went to check on her and she was nonresponsive, she had 5 cooking sherry bottles in the car around her.  Past Medical History  Diagnosis Date  . Alcohol abuse     History of 5 DWIs.  . Depression   . Suicide attempt   . Intentional overdose of multiple pharmaceuticals 04/21/2015  . Normocytic anemia 04/23/2015  . Acute ventilatory dependent respiratory failure 04/23/2015  . 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 Cosmetic surgery     No family history on file. History  Substance Use Topics  . Smoking status: Never Smoker   . Smokeless tobacco: Not on file  . Alcohol Use: 18.0 oz/week    30 Glasses of wine per week   OB History    No data available     Review of Systems  Unable to perform ROS: Mental status change      Allergies  Review of patient's allergies indicates no known allergies.  Home Medications   Prior to Admission medications   Medication Sig Start Date End Date Taking? Authorizing Provider  aspirin EC 81 MG tablet Take 1 tablet (81 mg total) by mouth daily. For heart health Patient not taking: Reported on 10/08/2014 05/18/14   Sanjuana KavaAgnes I Nwoko, NP  ferrous sulfate 325 (65 FE) MG tablet Take 1 tablet  (325 mg total) by mouth daily with breakfast. For low iron Patient not taking: Reported on 10/08/2014 05/18/14   Sanjuana KavaAgnes I Nwoko, NP  Multiple Vitamin (MULTIVITAMIN WITH MINERALS) TABS tablet Take 1 tablet by mouth daily. For low vitamin 05/18/14   Sanjuana KavaAgnes I Nwoko, NP   BP 95/59 mmHg  Pulse 92  Temp(Src) 98.6 F (37 C) (Oral)  Resp 14  SpO2 95% Physical Exam  Constitutional: She appears well-developed and well-nourished. No distress.  HENT:  Head: Normocephalic and atraumatic.  Mouth/Throat: Oropharynx is clear and moist.  Eyes: Conjunctivae and EOM are normal. Pupils are equal, round, and reactive to light.  Neck: Normal range of motion.  Cardiovascular: Normal rate, regular rhythm and intact distal pulses.   Pulmonary/Chest: Effort normal and breath sounds normal. No stridor. No respiratory distress. She has no wheezes. She has no rales.  Abdominal: Soft.  Musculoskeletal: Normal range of motion.  Neurological: She is alert.  Somnolent but arousable, oriented 3. Follows commands. Moves all extremities, sensation intact  Psychiatric: She has a normal mood and affect.  Patient has urinated on herself  Nursing note and vitals reviewed.    ED Course  Procedures (including critical care time)  CRITICAL CARE Performed by: Wynetta EmeryPISCIOTTA, NICOLE   Total critical care time: 35  Critical care time was exclusive of separately billable procedures and treating other patients.  Critical care was necessary to treat or  prevent imminent or life-threatening deterioration.  Critical care was time spent personally by me on the following activities: development of treatment plan with patient and/or surrogate as well as nursing, discussions with consultants, evaluation of patient's response to treatment, examination of patient, obtaining history from patient or surrogate, ordering and performing treatments and interventions, ordering and review of laboratory studies, ordering and review of radiographic  studies, pulse oximetry and re-evaluation of patient's condition.   Labs Review Labs Reviewed  CBC WITH DIFFERENTIAL/PLATELET - Abnormal; Notable for the following:    RBC 3.16 (*)    Hemoglobin 10.1 (*)    HCT 31.0 (*)    RDW 15.9 (*)    All other components within normal limits  I-STAT CHEM 8, ED - Abnormal; Notable for the following:    Sodium 150 (*)    Chloride 114 (*)    Creatinine, Ser 1.80 (*)    Calcium, Ion 1.09 (*)    Hemoglobin 11.6 (*)    HCT 34.0 (*)    All other components within normal limits  URINE CULTURE  ETHANOL  COMPREHENSIVE METABOLIC PANEL  LIPASE, BLOOD  URINE RAPID DRUG SCREEN, HOSP PERFORMED  SALICYLATE LEVEL  AMMONIA  ACETAMINOPHEN LEVEL  SODIUM, URINE, RANDOM  OSMOLALITY  URINALYSIS, ROUTINE W REFLEX MICROSCOPIC (NOT AT [facility])  PREGNANCY, URINE  I-STAT CG4 LACTIC ACID, ED    Imaging Review No results found.   EKG Interpretation None      MDM   Final diagnoses:  Intoxication  Hyponatremia  AKI (acute kidney injury)  Alcohol use disorder, severe, dependence    Filed Vitals:   05/04/15 1429 05/04/15 1500  BP: 95/59   Pulse: 92   Temp: 98.6 F (37 C)   TempSrc: Oral   Resp: 14   SpO2: 98% 95%    Medications  0.45 % sodium chloride infusion (not administered)  LORazepam (ATIVAN) tablet 1 mg (not administered)    Or  LORazepam (ATIVAN) injection 1 mg (not administered)  thiamine (VITAMIN B-1) tablet 100 mg (not administered)    Or  thiamine (B-1) injection 100 mg (not administered)  folic acid (FOLVITE) tablet 1 mg (not administered)  multivitamin with minerals tablet 1 tablet (not administered)    Kelsey [name] is a pleasant 44 y.o. female presenting with alcohol intoxication and altered mental status. This may have been a suicide attempt. Blood pressure is slightly soft but vitals otherwise unremarkable. Patient is arousable, protecting her airway.  Discussed case with attending physician who agrees with care  plan and disposition.   His mother is very upset, frustrated and states that she needs help dealing with her daughter who lives in the home with her. Social worker is consulted.  I-STAT chemistry shows sodium of 150 and a creatinine of 1.8. Patient started on half name is normal saline at 150 mL an hour. Case discussed with [facility] Dr. Elisabeth Pigeon who admission to a step down bed.      Wynetta Emery, PA-C 05/04/15 [phone]

## 2015-05-04 NOTE — Progress Notes (Signed)
CSW attempted to speak with patient. However, she was not communicative. Mom was present. CSW spoke with mom in ED Conference.   Mom informed CSW that she called police today because she found the patient at home  intoxicated in her truck. Per note, the patient consumed 3-5 bottles of cooking wine.  Mom informed CSW that the pt often abuses alcohol. She also states that the pt has a past hx of depression. He says that the pt has never been able to support herself. She says that the pt cannot keep a steady job and gets 5-6 jobs per year.  Mom informed CSW that the pt urinates on herself often. She also states that the pt defecates on herself at times and sleeps in her urine.  Mom informed CSW that the pt is manipulative and does not have a consistent routine for her life. She states that she is not sure if the pt always takes her medications. Mom informed CSW that she believes the patient feels powerless. She states that the pt never expresses her feelings.  Kelsey RhodesBetty " June" / Mom [phone]  Trish MageBrittney [name], LCSWA [phone] ED CSW 05/04/2015 10:47 PM

## 2015-05-05 DIAGNOSIS — E870 Hyperosmolality and hypernatremia: Secondary | ICD-10-CM | POA: Insufficient documentation

## 2015-05-05 DIAGNOSIS — D638 Anemia in other chronic diseases classified elsewhere: Secondary | ICD-10-CM | POA: Insufficient documentation

## 2015-05-05 DIAGNOSIS — N179 Acute kidney failure, unspecified: Secondary | ICD-10-CM | POA: Insufficient documentation

## 2015-05-05 LAB — COMPREHENSIVE METABOLIC PANEL
ALT: 17 U/L (ref 14–54)
AST: 22 U/L (ref 15–41)
Albumin: 3.4 g/dL — ABNORMAL LOW (ref 3.5–5.0)
Alkaline Phosphatase: 28 U/L — ABNORMAL LOW (ref 38–126)
Anion gap: 7 (ref 5–15)
BUN: 10 mg/dL (ref 6–20)
CO2: 24 mmol/L (ref 22–32)
Calcium: 7.7 mg/dL — ABNORMAL LOW (ref 8.9–10.3)
Chloride: 113 mmol/L — ABNORMAL HIGH (ref 101–111)
Creatinine, Ser: 0.63 mg/dL (ref 0.44–1.00)
GFR calc Af Amer: >60 mL/min (ref >60)
GFR calc non Af Amer: >60 mL/min (ref >60)
Glucose, Bld: 82 mg/dL (ref 65–99)
Potassium: 3.9 mmol/L (ref 3.5–5.1)
Sodium: 144 mmol/L (ref 135–145)
Total Bilirubin: 0.7 mg/dL (ref 0.3–1.2)
Total Protein: 5.8 g/dL — ABNORMAL LOW (ref 6.5–8.1)

## 2015-05-05 LAB — URINALYSIS, ROUTINE W REFLEX MICROSCOPIC
Bilirubin Urine: NEGATIVE
Glucose, UA: NEGATIVE mg/dL
Hgb urine dipstick: NEGATIVE
Ketones, ur: NEGATIVE mg/dL
Nitrite: NEGATIVE
Protein, ur: NEGATIVE mg/dL
Specific Gravity, Urine: 1.015 (ref 1.005–1.030)
Urobilinogen, UA: 0.2 mg/dL (ref 0.0–1.0)
pH: 5 (ref 5.0–8.0)

## 2015-05-05 LAB — GLUCOSE, CAPILLARY: Glucose-Capillary: 61 mg/dL — ABNORMAL LOW (ref 65–99)

## 2015-05-05 LAB — URINE MICROSCOPIC-ADD ON

## 2015-05-05 LAB — RAPID URINE DRUG SCREEN, HOSP PERFORMED
Amphetamines: NOT DETECTED
Barbiturates: NOT DETECTED
Benzodiazepines: NOT DETECTED
Cocaine: NOT DETECTED
Opiates: NOT DETECTED
Tetrahydrocannabinol: NOT DETECTED

## 2015-05-05 LAB — PREGNANCY, URINE: Preg Test, Ur: NEGATIVE

## 2015-05-05 LAB — SODIUM, URINE, RANDOM: Sodium, Ur: 191 mmol/L

## 2015-05-05 LAB — OSMOLALITY: Osmolality: 420 mOsm/kg — ABNORMAL HIGH (ref 275–300)

## 2015-05-05 MED ORDER — FOLIC ACID 1 MG PO TABS: 1.0000 mg | ORAL_TABLET | Freq: Every day | ORAL | Status: DC

## 2015-05-05 MED ORDER — THIAMINE HCL 100 MG PO TABS: 100.0000 mg | ORAL_TABLET | Freq: Every day | ORAL | Status: DC

## 2015-05-05 NOTE — Progress Notes (Signed)
05/05/15 1230  Reviewed discharge instructions with patient. Patient verbalized understanding of discharge instructions. Copy of discharge instructions, work note, and prescription given to patient. Also, Child psychotherapistsocial worker gave patient ETOH resources.

## 2015-05-05 NOTE — Discharge Summary (Signed)
Physician Discharge Summary  Kelsey [name]:[id number] DOB: 1971-01-28 DOA: 05/04/2015  PCP: Salli Real, MD  Admit date: 05/04/2015 Discharge date: 05/05/2015  Recommendations for Outpatient Follow-up:  1. No changes in medications on discharge   Discharge Diagnoses:  Active Problems:   Acute alcohol intoxication    Discharge Condition: stable   Diet recommendation: as tolerated   History of present illness:  44 year old female with past medical history of alcohol abuse, history of depression who was found by her mother in the car unresponsive with alcohol bottles around her in the car. On admission, pt was lethargic and unable to provide history.  In ER, patient was slightly hypotensive, 85/45 but blood pressure improved with IV fluids 211/76. Blood work was significant for hemoglobin of 10.1, sodium 150, chloride 118, initially normal creatinine but on repeat blood work it was 1.8. CT head without show acute intracranial abnormalities. Chest x-ray showed no acute cardiopulmonary disease.   Hospital Course:   Assessment & Plan    Principal problem: Acute encephalopathy / acute alcohol intoxication / alcohol dependence and abuse - Likely secondary to alcohol intoxication. - Alcohol level 446 on admission - She was placed on CIWA on admission - Mental status good this am, no withdrawals, she is alert, oriented to time, place and person - Pt wants to go home and she is medically stable for discharge after SW provides pt with information on help with alcohol abuse  Active Problems: Possible suicidal ideations  - Patient is not suicidal. She drinks regularly but is not depressed and did not have intention to harm herself or anyone else   Hypernatremia - Likely dehydration from alcohol abuse / use - Resolved with IV fluids   Acute renal failure - Likely from acute alcohol intoxication - Improved with IV fluids   Anemia of chronic disease - Likely from bone marrow  suppression from alcohol abuse - Hemoglobin is 10.1 - No current indications for transfusion    DVT prophylaxis:  - SCDs bilaterally   Signed:  Manson Passey, MD  [facility] 05/05/2015, 10:34 AM  Pager #: [phone]  Time spent in minutes: less than 30 minutes  Procedures:  None   Consultations:  None   Discharge Exam: Filed Vitals:   05/05/15 0900  BP: 118/58  Pulse: 96  Temp:   Resp: 16   Filed Vitals:   05/05/15 0400 05/05/15 0500 05/05/15 0600 05/05/15 0900  BP: 104/78 113/70 112/79 118/58  Pulse: 81 95 89 96  Temp:      TempSrc:      Resp: 15 15 15 16   Height:      Weight:      SpO2: 98% 97% 96% 97%    General: Pt is alert, follows commands appropriately, not in acute distress Cardiovascular: Regular rate and rhythm, S1/S2 +, no murmurs Respiratory: Clear to auscultation bilaterally, no wheezing, no crackles, no rhonchi Abdominal: Soft, non tender, non distended, bowel sounds +, no guarding Extremities: no edema, no cyanosis, pulses palpable bilaterally DP and PT Neuro: Grossly nonfocal  Discharge Instructions  Discharge Instructions    Call MD for:  difficulty breathing, headache or visual disturbances    Complete by:  As directed      Call MD for:  persistant nausea and vomiting    Complete by:  As directed      Call MD for:  severe uncontrolled pain    Complete by:  As directed      Diet - low sodium heart healthy  Complete by:  As directed      Increase activity slowly    Complete by:  As directed             Medication List    TAKE these medications        aspirin EC 81 MG tablet  Take 1 tablet (81 mg total) by mouth daily. For heart health     ferrous sulfate 325 (65 FE) MG tablet  Take 1 tablet (325 mg total) by mouth daily with breakfast. For low iron     folic acid 1 MG tablet  Commonly known as:  FOLVITE  Take 1 tablet (1 mg total) by mouth daily.     multivitamin with minerals Tabs tablet  Take 1 tablet  by mouth daily. For low vitamin     thiamine 100 MG tablet  Take 1 tablet (100 mg total) by mouth daily.           Follow-up Information    Follow up with SUN,YUN, MD. Schedule an appointment as soon as possible for a visit in 1 week.   Specialty:  Internal Medicine   Why:  Follow up appt after recent hospitalization   Contact information:   [address] [place] [address] [phone]        The results of significant diagnostics from this hospitalization (including imaging, microbiology, ancillary and laboratory) are listed below for reference.    Significant Diagnostic Studies: Dg Chest 1 View  05/04/2015   CLINICAL DATA:  Intoxication.  EXAM: CHEST  1 VIEW  COMPARISON:  04/22/2015  FINDINGS: Numerous leads and wires project over the chest. Mild right hemidiaphragm elevation. Midline trachea. Normal heart size and mediastinal contours. No pleural effusion or pneumothorax. Clear lungs. No free intraperitoneal air.  IMPRESSION: No acute cardiopulmonary disease.   Electronically Signed   By: Jeronimo Greaves M.D.   On: 05/04/2015 16:57   Dg Abd 1 View  04/21/2015   CLINICAL DATA:  Drug overdose  EXAM: ABDOMEN - 1 VIEW  COMPARISON:  None.  FINDINGS: NG tube in the stomach. Normal bowel gas pattern. No renal calculi. Bilateral pelvic calcifications most likely phleboliths. No acute bony abnormality.  IMPRESSION: Negative.   Electronically Signed   By: Marlan Palau M.D.   On: 04/21/2015 07:37   Ct Head Wo Contrast  05/04/2015   CLINICAL DATA:  Altered mental status  EXAM: CT HEAD WITHOUT CONTRAST  CT CERVICAL SPINE WITHOUT CONTRAST  TECHNIQUE: Multidetector CT imaging of the head and cervical spine was performed following the standard protocol without intravenous contrast. Multiplanar CT image reconstructions of the cervical spine were also generated.  COMPARISON:  04/21/2015.  FINDINGS: CT HEAD FINDINGS  No skull fracture is noted. No intracranial hemorrhage, mass effect or midline  shift. No acute cortical infarction. No hydrocephalus. No mass lesion is noted on this unenhanced scan. Paranasal sinuses and mastoid air cells are unremarkable. No intra or extra-axial fluid collection.  CT CERVICAL SPINE FINDINGS  Axial images of the cervical spine shows no acute fracture or subluxation.  There is no pneumothorax in visualized lung apices. Computer processed images shows no acute fracture or subluxation. Alignment and vertebral body heights are preserved. No prevertebral soft tissue swelling. Cervical airway is patent. There is disc space flattening with mild anterior and mild posterior spurring at C4-C5 level. C1-C2 relationship is unremarkable.  IMPRESSION: 1. No acute intracranial abnormality.  No significant change. 2. No cervical spine acute fracture or subluxation. 3. Degenerative  changes at C4-C5 level. No prevertebral soft tissue swelling.   Electronically Signed   By: Natasha Mead M.D.   On: 05/04/2015 16:53   Ct Head Wo Contrast  04/21/2015   CLINICAL DATA:  Altered mental status.  Overdose.  EXAM: CT HEAD WITHOUT CONTRAST  TECHNIQUE: Contiguous axial images were obtained from the base of the skull through the vertex without intravenous contrast.  COMPARISON:  None.  FINDINGS: Ventricle size is normal. Negative for acute or chronic infarction. Negative for hemorrhage or fluid collection. Negative for mass or edema. No shift of the midline structures.  Calvarium is intact. Mucosal edema in the paranasal sinuses without air-fluid level. Pharyngeal edema related to intubation.  IMPRESSION: Normal CT of the head.   Electronically Signed   By: Marlan Palau M.D.   On: 04/21/2015 08:01   Ct Soft Tissue Neck W Contrast  04/21/2015   ADDENDUM REPORT: 04/21/2015 08:37  ADDENDUM: These results were called by telephone at the time of interpretation on 04/21/2015 at 8:36 am to Dr. Effie Shy , who verbally acknowledged these results.   Electronically Signed   By: Marlan Palau M.D.   On: 04/21/2015  08:37   04/21/2015   CLINICAL DATA:  Overdose. Post intubation. Left neck swelling post intubation.  EXAM: CT NECK WITH CONTRAST  TECHNIQUE: Multidetector CT imaging of the neck was performed using the standard protocol following the bolus administration of intravenous contrast.  CONTRAST:  80mL OMNIPAQUE IOHEXOL 300 MG/ML  SOLN  COMPARISON:  None.  FINDINGS: Pharynx and larynx: The patient is intubated. Endotracheal tube extends into the trachea. The endotracheal tube balloon is inflated just below the larynx. The hyoid bone is mildly displaced to the left. The thyroid cartilage is more significantly displaced to the left raising the possibility of thyrohyoid ligament disruption. Larynx is displaced to the left of midline significantly. Arytenoid cartilage in normal position bilaterally. No gas in the soft tissues. NG tube in the esophagus. Pharyngeal edema is present surrounding the endotracheal tube. There is fluid in the nasopharynx.  Salivary glands: Negative  Thyroid: Thyroid is diffusely abnormal bilaterally with multiple linear areas of low-density throughout the gland. There is fluid density surrounding the thyroid gland bilaterally. Etiology is not clear but this may be related to trauma from intubation or neck manipulation. Thyroiditis less likely.  Lymph nodes: No adenopathy  Vascular: Carotid artery patent bilaterally without stenosis. Jugular vein patent bilaterally.  Limited intracranial: Negative  Visualized orbits: Negative  Mastoids and visualized paranasal sinuses: Mucosal edema in the ethmoid sinuses. Mastoid sinus clear bilaterally.  Skeleton: Negative  Upper chest: Lung apices clear  IMPRESSION: Endotracheal tube extends into the trachea. NG tube in the esophagus.  The hyoid bone is mildly displaced to the left. There is more significant displacement of the thyroid cartilage to the left. This raises the possibility of thyrohyoid ligament disruption or cartilage injury.  Diffuse edema  throughout the thyroid gland with fluid surrounding the thyroid gland bilaterally, question trauma to the thyroid gland.  Electronically Signed: By: Marlan Palau M.D. On: 04/21/2015 08:29   Ct Cervical Spine Wo Contrast  05/04/2015   CLINICAL DATA:  Altered mental status  EXAM: CT HEAD WITHOUT CONTRAST  CT CERVICAL SPINE WITHOUT CONTRAST  TECHNIQUE: Multidetector CT imaging of the head and cervical spine was performed following the standard protocol without intravenous contrast. Multiplanar CT image reconstructions of the cervical spine were also generated.  COMPARISON:  04/21/2015.  FINDINGS: CT HEAD FINDINGS  No skull fracture is  noted. No intracranial hemorrhage, mass effect or midline shift. No acute cortical infarction. No hydrocephalus. No mass lesion is noted on this unenhanced scan. Paranasal sinuses and mastoid air cells are unremarkable. No intra or extra-axial fluid collection.  CT CERVICAL SPINE FINDINGS  Axial images of the cervical spine shows no acute fracture or subluxation.  There is no pneumothorax in visualized lung apices. Computer processed images shows no acute fracture or subluxation. Alignment and vertebral body heights are preserved. No prevertebral soft tissue swelling. Cervical airway is patent. There is disc space flattening with mild anterior and mild posterior spurring at C4-C5 level. C1-C2 relationship is unremarkable.  IMPRESSION: 1. No acute intracranial abnormality.  No significant change. 2. No cervical spine acute fracture or subluxation. 3. Degenerative changes at C4-C5 level. No prevertebral soft tissue swelling.   Electronically Signed   By: Natasha MeadLiviu  Pop M.D.   On: 05/04/2015 16:53   Dg Chest Port 1 View  04/22/2015   CLINICAL DATA:  Intubation.  EXAM: PORTABLE CHEST - 1 VIEW  COMPARISON:  04/21/2015.  FINDINGS: Endotracheal tube and NG tube in stable position. Mediastinum hilar structures are normal. Lungs are clear. Heart size normal. No pleural effusion or  pneumothorax.  IMPRESSION: 1. Lines and tubes in stable position. 2. No acute cardiopulmonary disease.   Electronically Signed   By: Maisie Fushomas  Register   On: 04/22/2015 07:05   Dg Chest Portable 1 View  04/21/2015   CLINICAL DATA:  Intubation drug overdose  EXAM: PORTABLE CHEST - 1 VIEW  COMPARISON:  04/21/2015  FINDINGS: Endotracheal tube in good position.  NG tube enters the stomach.  Hypoventilation with decreased lung volume compared with earlier study. Mild left lower lobe atelectasis. Negative for pneumonia or effusion  IMPRESSION: Endotracheal tube in good position.  NG tube in the stomach  Mild left lower lobe atelectasis secondary to hypoventilation.   Electronically Signed   By: Marlan Palauharles  Clark M.D.   On: 04/21/2015 07:45   Dg Chest Port 1 View  04/21/2015   CLINICAL DATA:  Altered mental status.  Unresponsive  EXAM: PORTABLE CHEST - 1 VIEW  COMPARISON:  None.  FINDINGS: The heart size and mediastinal contours are within normal limits. Both lungs are clear. The visualized skeletal structures are unremarkable.  IMPRESSION: No active disease.   Electronically Signed   By: Marlan Palauharles  Clark M.D.   On: 04/21/2015 06:58    Microbiology: No results found for this or any previous visit (from the past 240 hour(s)).   Labs: Basic Metabolic Panel:  Recent Labs Lab 05/04/15 1543 05/04/15 1548 05/04/15 2045 05/05/15 0335  NA 150* 150* 144 144  K 3.9 3.9 3.6 3.9  CL 118* 114* 112* 113*  CO2 25  --  25 24  GLUCOSE 93 90 95 82  BUN 8 6 9 10   CREATININE 0.72 1.80* 0.67 0.63  CALCIUM 8.1*  --  7.8* 7.7*  MG  --   --  1.7  --   PHOS  --   --  4.3  --    Liver Function Tests:  Recent Labs Lab 05/04/15 1543 05/04/15 2045 05/05/15 0335  AST 23 26 22   ALT 18 18 17   ALKPHOS 34* 30* 28*  BILITOT 0.5 0.5 0.7  PROT 6.4* 6.1* 5.8*  ALBUMIN 3.8 3.4* 3.4*    Recent Labs Lab 05/04/15 1543  LIPASE 584*    Recent Labs Lab 05/04/15 1548  AMMONIA 23   CBC:  Recent Labs Lab  05/04/15 1543 05/04/15 1548  05/04/15 2045  WBC 5.1  --  6.7  NEUTROABS 2.7  --  4.9  HGB 10.1* 11.6* 9.7*  HCT 31.0* 34.0* 29.3*  MCV 98.1  --  97.7  PLT 278  --  260   Cardiac Enzymes: No results for input(s): CKTOTAL, CKMB, CKMBINDEX, TROPONINI in the last 168 hours. BNP: BNP (last 3 results) No results for input(s): BNP in the last 8760 hours.  ProBNP (last 3 results) No results for input(s): PROBNP in the last 8760 hours.  CBG:  Recent Labs Lab 05/05/15 0730  GLUCAP 61*

## 2015-05-05 NOTE — Clinical Social Work Note (Signed)
Clinical Social Work Assessment  Patient Details  Name: Kelsey [name] MRN: [id number] Date of Birth: 07/31/1971  Date of referral:  05/05/15               Reason for consult:  Substance Use/ETOH Abuse                Permission sought to share information with:    Permission granted to share information::     Name::        Agency::     Relationship::     Contact Information:     Housing/Transportation Living arrangements for the past 2 months:  Apartment, Single Family Home Source of Information:  Patient Patient Interpreter Needed:  None Criminal Activity/Legal Involvement Pertinent to Current Situation/Hospitalization:  No - Comment as needed Significant Relationships:  Parents Lives with:  Parents Do you feel safe going back to the place where you live?  Yes Need for family participation in patient care:  No (Coment)  Care giving concerns:  ED CSW notes indicate that pt's mother has concerns regarding pt's ETOH abuse and self neglect.   Social Worker assessment / plan:  Pt hospitalized on 05/04/15 with a dx of acute alcohol intoxication. Pt last hospitalized on 04/21/15 and seen by CSW for assistance with SA / MH resources. Pt admits to binge drinking, past DWI's and previous attempts at rehab. SA resources provided to pt. Pt plans to d/c home today.   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  Patient/Family's Understanding of and Emotional Response to Diagnosis, Current Treatment, and Prognosis:  Pt reports that she knows she needs help. Pt reports she will make arrangements for SA assistance.   Emotional Assessment Appearance:  Appears stated age Attitude/Demeanor/Rapport:  Other  (cooperative) Affect (typically observed):  Calm, Pleasant Orientation:  Oriented to Self, Oriented to Place, Oriented to  Time, Oriented to Situation Alcohol / Substance use:  Alcohol Use Psych involvement (Current and /or in the community):  No (Comment)  Discharge Needs  Concerns to be addressed:  Discharge Planning Concerns Readmission within the last 30 days:  Yes Current discharge risk:  Substance Abuse Barriers to Discharge:  No Barriers Identified   [name], Kelsey GaveJamie Lee, LCSW 05/05/2015, 2:10 PM

## 2015-05-05 NOTE — Progress Notes (Signed)
Utilization review completed.  

## 2015-05-05 NOTE — Discharge Instructions (Signed)
Alcohol Use Disorder Alcohol use disorder is a mental disorder. It is not a one-time incident of heavy drinking. Alcohol use disorder is the excessive and uncontrollable use of alcohol over time that leads to problems with functioning in one or more areas of daily living. People with this disorder risk harming themselves and others when they drink to excess. Alcohol use disorder also can cause other mental disorders, such as mood and anxiety disorders, and serious physical problems. People with alcohol use disorder often misuse other drugs.  Alcohol use disorder is common and widespread. Some people with this disorder drink alcohol to cope with or escape from negative life events. Others drink to relieve chronic pain or symptoms of mental illness. People with a family history of alcohol use disorder are at higher risk of losing control and using alcohol to excess.  SYMPTOMS  Signs and symptoms of alcohol use disorder may include the following:   Consumption ofalcohol inlarger amounts or over a longer period of time than intended.  Multiple unsuccessful attempts to cutdown or control alcohol use.   A great deal of time spent obtaining alcohol, using alcohol, or recovering from the effects of alcohol (hangover).  A strong desire or urge to use alcohol (cravings).   Continued use of alcohol despite problems at work, school, or home because of alcohol use.   Continued use of alcohol despite problems in relationships because of alcohol use.  Continued use of alcohol in situations when it is physically hazardous, such as driving a car.  Continued use of alcohol despite awareness of a physical or psychological problem that is likely related to alcohol use. Physical problems related to alcohol use can involve the brain, heart, liver, stomach, and intestines. Psychological problems related to alcohol use include intoxication, depression, anxiety, psychosis, delirium, and dementia.   The need for  increased amounts of alcohol to achieve the same desired effect, or a decreased effect from the consumption of the same amount of alcohol (tolerance).  Withdrawal symptoms upon reducing or stopping alcohol use, or alcohol use to reduce or avoid withdrawal symptoms. Withdrawal symptoms include:  Racing heart.  Hand tremor.  Difficulty sleeping.  Nausea.  Vomiting.  Hallucinations.  Restlessness.  Seizures. DIAGNOSIS Alcohol use disorder is diagnosed through an assessment by your health care provider. Your health care provider may start by asking three or four questions to screen for excessive or problematic alcohol use. To confirm a diagnosis of alcohol use disorder, at least two symptoms must be present within a 12-month period. The severity of alcohol use disorder depends on the number of symptoms:  Mild--two or three.  Moderate--four or five.  Severe--six or more. Your health care provider may perform a physical exam or use results from lab tests to see if you have physical problems resulting from alcohol use. Your health care provider may refer you to a mental health professional for evaluation. TREATMENT  Some people with alcohol use disorder are able to reduce their alcohol use to low-risk levels. Some people with alcohol use disorder need to quit drinking alcohol. When necessary, mental health professionals with specialized training in substance use treatment can help. Your health care provider can help you decide how severe your alcohol use disorder is and what type of treatment you need. The following forms of treatment are available:   Detoxification. Detoxification involves the use of prescription medicines to prevent alcohol withdrawal symptoms in the first week after quitting. This is important for people with a history of symptoms   of withdrawal and for heavy drinkers who are likely to have withdrawal symptoms. Alcohol withdrawal can be dangerous and, in severe cases, cause  death. Detoxification is usually provided in a hospital or in-patient substance use treatment facility.  Counseling or talk therapy. Talk therapy is provided by substance use treatment counselors. It addresses the reasons people use alcohol and ways to keep them from drinking again. The goals of talk therapy are to help people with alcohol use disorder find healthy activities and ways to cope with life stress, to identify and avoid triggers for alcohol use, and to handle cravings, which can cause relapse.  Medicines.Different medicines can help treat alcohol use disorder through the following actions:  Decrease alcohol cravings.  Decrease the positive reward response felt from alcohol use.  Produce an uncomfortable physical reaction when alcohol is used (aversion therapy).  Support groups. Support groups are run by people who have quit drinking. They provide emotional support, advice, and guidance. These forms of treatment are often combined. Some people with alcohol use disorder benefit from intensive combination treatment provided by specialized substance use treatment centers. Both inpatient and outpatient treatment programs are available. Document Released: 11/29/2004 Document Revised: 03/08/2014 Document Reviewed: 01/29/2013 Exit[employer] Patient Information 2015 [employer]. This information is not intended to replace advice given to you by your health care provider. Make sure you discuss any questions you have with your health care provider.  

## 2015-05-06 LAB — URINE CULTURE: Culture: NO GROWTH

## 2015-06-29 ENCOUNTER — Emergency Department (HOSPITAL_COMMUNITY): Payer: Medicaid Other

## 2015-06-29 ENCOUNTER — Encounter (HOSPITAL_COMMUNITY): Payer: Self-pay

## 2015-06-29 ENCOUNTER — Emergency Department (HOSPITAL_COMMUNITY)
Admission: EM | Admit: 2015-06-29 | Discharge: 2015-06-29 | Disposition: A | Discharge status: Home / Self Care | Payer: Medicaid Other | Attending: Emergency Medicine | Admitting: Emergency Medicine

## 2015-06-29 DIAGNOSIS — D649 Anemia, unspecified: Secondary | ICD-10-CM | POA: Diagnosis not present

## 2015-06-29 DIAGNOSIS — Z8709 Personal history of other diseases of the respiratory system: Secondary | ICD-10-CM | POA: Insufficient documentation

## 2015-06-29 DIAGNOSIS — F329 Major depressive disorder, single episode, unspecified: Secondary | ICD-10-CM | POA: Insufficient documentation

## 2015-06-29 DIAGNOSIS — R4182 Altered mental status, unspecified: Secondary | ICD-10-CM | POA: Diagnosis present

## 2015-06-29 DIAGNOSIS — F10929 Alcohol use, unspecified with intoxication, unspecified: Secondary | ICD-10-CM

## 2015-06-29 DIAGNOSIS — Z3202 Encounter for pregnancy test, result negative: Secondary | ICD-10-CM | POA: Diagnosis not present

## 2015-06-29 DIAGNOSIS — F10129 Alcohol abuse with intoxication, unspecified: Secondary | ICD-10-CM | POA: Insufficient documentation

## 2015-06-29 LAB — CBC
HCT: 40.8 % (ref 36.0–46.0)
HCT: 38.6 % (ref 36.0–46.0)
Hemoglobin: 14 g/dL (ref 12.0–15.0)
Hemoglobin: 13.1 g/dL (ref 12.0–15.0)
MCH: 33 pg (ref 26.0–34.0)
MCH: 32.8 pg (ref 26.0–34.0)
MCHC: 34.3 g/dL (ref 30.0–36.0)
MCHC: 33.9 g/dL (ref 30.0–36.0)
MCV: 96.2 fL (ref 78.0–100.0)
MCV: 96.5 fL (ref 78.0–100.0)
Platelets: 353 10*3/uL (ref 150–400)
Platelets: 280 10*3/uL (ref 150–400)
RBC: 4.24 MIL/uL (ref 3.87–5.11)
RBC: 4 MIL/uL (ref 3.87–5.11)
RDW: 15.5 % (ref 11.5–15.5)
RDW: 15.4 % (ref 11.5–15.5)
WBC: 5.6 10*3/uL (ref 4.0–10.5)
WBC: 5.1 10*3/uL (ref 4.0–10.5)

## 2015-06-29 LAB — COMPREHENSIVE METABOLIC PANEL
ALT: 21 U/L (ref 14–54)
ALT: 20 U/L (ref 14–54)
AST: 41 U/L (ref 15–41)
AST: 40 U/L (ref 15–41)
Albumin: 4.8 g/dL (ref 3.5–5.0)
Albumin: 4.6 g/dL (ref 3.5–5.0)
Alkaline Phosphatase: 43 U/L (ref 38–126)
Alkaline Phosphatase: 43 U/L (ref 38–126)
Anion gap: 15 (ref 5–15)
Anion gap: 15 (ref 5–15)
BUN: 14 mg/dL (ref 6–20)
BUN: 14 mg/dL (ref 6–20)
CO2: 24 mmol/L (ref 22–32)
CO2: 25 mmol/L (ref 22–32)
Calcium: 9.2 mg/dL (ref 8.9–10.3)
Calcium: 8.9 mg/dL (ref 8.9–10.3)
Chloride: 103 mmol/L (ref 101–111)
Chloride: 102 mmol/L (ref 101–111)
Creatinine, Ser: 0.68 mg/dL (ref 0.44–1.00)
Creatinine, Ser: 0.72 mg/dL (ref 0.44–1.00)
GFR calc Af Amer: >60 mL/min (ref >60)
GFR calc Af Amer: >60 mL/min (ref >60)
GFR calc non Af Amer: >60 mL/min (ref >60)
GFR calc non Af Amer: >60 mL/min (ref >60)
Glucose, Bld: 73 mg/dL (ref 65–99)
Glucose, Bld: 79 mg/dL (ref 65–99)
Potassium: 4.2 mmol/L (ref 3.5–5.1)
Potassium: 4.1 mmol/L (ref 3.5–5.1)
Sodium: 142 mmol/L (ref 135–145)
Sodium: 142 mmol/L (ref 135–145)
Total Bilirubin: 1.4 mg/dL — ABNORMAL HIGH (ref 0.3–1.2)
Total Bilirubin: 1 mg/dL (ref 0.3–1.2)
Total Protein: 8.1 g/dL (ref 6.5–8.1)
Total Protein: 8.1 g/dL (ref 6.5–8.1)

## 2015-06-29 LAB — SALICYLATE LEVEL: Salicylate Lvl: <4 mg/dL (ref 2.8–30.0)

## 2015-06-29 LAB — URINALYSIS, ROUTINE W REFLEX MICROSCOPIC
Bilirubin Urine: NEGATIVE
Glucose, UA: NEGATIVE mg/dL
Ketones, ur: NEGATIVE mg/dL
Leukocytes, UA: NEGATIVE
Nitrite: NEGATIVE
Protein, ur: NEGATIVE mg/dL
Specific Gravity, Urine: 1.01 (ref 1.005–1.030)
Urobilinogen, UA: 0.2 mg/dL (ref 0.0–1.0)
pH: 5.5 (ref 5.0–8.0)

## 2015-06-29 LAB — ETHANOL: Alcohol, Ethyl (B): 398 mg/dL (ref <5)

## 2015-06-29 LAB — PREGNANCY, URINE: Preg Test, Ur: NEGATIVE

## 2015-06-29 LAB — ACETAMINOPHEN LEVEL: Acetaminophen (Tylenol), Serum: <10 ug/mL — ABNORMAL LOW (ref 10–30)

## 2015-06-29 LAB — CBG MONITORING, ED: Glucose-Capillary: 81 mg/dL (ref 65–99)

## 2015-06-29 LAB — URINE MICROSCOPIC-ADD ON

## 2015-06-29 MED ORDER — ONDANSETRON HCL 4 MG/2ML IJ SOLN
4.0000 mg | Freq: Once | INTRAMUSCULAR | Status: AC
  Administered 2015-06-29: 4 mg via INTRAVENOUS
  Filled 2015-06-29: qty 2

## 2015-06-29 MED ORDER — SODIUM CHLORIDE 0.9 % IV BOLUS (SEPSIS)
1000.0000 mL | Freq: Once | INTRAVENOUS | Status: AC
  Administered 2015-06-29: 1000 mL via INTRAVENOUS

## 2015-06-29 NOTE — ED Notes (Signed)
Bed: [facility] Expected date:  Expected time:  Means of arrival:  Comments: 

## 2015-06-29 NOTE — ED Provider Notes (Signed)
CSN: [id number]     Arrival date & time 06/29/15  1257 History   First MD Initiated Contact with Patient 06/29/15 1511     Chief Complaint  Patient presents with  . Altered Mental Status     (Consider location/radiation/quality/duration/timing/severity/associated sxs/prior Treatment) Patient is a 44 y.o. female presenting with altered mental status. The history is provided by the EMS personnel. No language interpreter was used.  Altered Mental Status Severity:  Severe Associated symptoms comment:  The patient arrives via EMS after being found altered in a parking lot by [employer], strong smell of alcohol. The patient cannot provide history in the ED. She wakes easily but is disoriented, incoherent.    Past Medical History  Diagnosis Date  . Alcohol abuse     History of 5 DWIs.  . Depression   . Suicide attempt   . Intentional overdose of multiple pharmaceuticals 04/21/2015  . Normocytic anemia 04/23/2015  . Acute ventilatory dependent respiratory failure 04/23/2015  . 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 Cosmetic surgery     History reviewed. No pertinent family history. Social History  Substance Use Topics  . Smoking status: Never Smoker   . Smokeless tobacco: None  . Alcohol Use: 18.0 oz/week    30 Glasses of wine per week   OB History    No data available     Review of Systems  Unable to perform ROS: Mental status change      Allergies  Review of patient's allergies indicates no known allergies.  Home Medications   Prior to Admission medications   Medication Sig Start Date End Date Taking? Authorizing Provider  aspirin EC 81 MG tablet Take 1 tablet (81 mg total) by mouth daily. For heart health Patient not taking: Reported on 10/08/2014 05/18/14   Sanjuana Kava, NP  ferrous sulfate 325 (65 FE) MG tablet Take 1 tablet (325 mg total) by mouth daily with breakfast. For low iron Patient not taking:  Reported on 10/08/2014 05/18/14   Sanjuana Kava, NP  folic acid (FOLVITE) 1 MG tablet Take 1 tablet (1 mg total) by mouth daily. Patient not taking: Reported on 06/29/2015 05/05/15   Alison Murray, MD  Multiple Vitamin (MULTIVITAMIN WITH MINERALS) TABS tablet Take 1 tablet by mouth daily. For low vitamin Patient not taking: Reported on 05/04/2015 05/18/14   Sanjuana Kava, NP  thiamine 100 MG tablet Take 1 tablet (100 mg total) by mouth daily. Patient not taking: Reported on 06/29/2015 05/05/15   Alison Murray, MD   BP 95/61 mmHg  Pulse 104  Temp(Src) 99.4 F (37.4 C) (Oral)  Resp 17  SpO2 94%  LMP 06/08/2015 Physical Exam  Constitutional: She appears well-developed and well-nourished. She appears lethargic.  HENT:  Head: Normocephalic and atraumatic.  Neck: Normal range of motion. Neck supple.  Cardiovascular: Normal rate and regular rhythm.   Pulmonary/Chest: Effort normal and breath sounds normal.  Abdominal: Soft. Bowel sounds are normal. There is no tenderness. There is no rebound and no guarding.  Musculoskeletal: Normal range of motion.  Neurological: She appears lethargic. GCS eye subscore is 4. GCS verbal subscore is 3. GCS motor subscore is 5.  Skin: Skin is warm and dry. No rash noted.  Psychiatric: She has a normal mood and affect.    ED Course  Procedures (including critical care time) Labs Review Labs Reviewed  COMPREHENSIVE METABOLIC PANEL - Abnormal; Notable for the following:  Total Bilirubin 1.4 (*)    All other components within normal limits  URINALYSIS, ROUTINE W REFLEX MICROSCOPIC (NOT AT [facility]) - Abnormal; Notable for the following:    APPearance CLOUDY (*)    Hgb urine dipstick TRACE (*)    All other components within normal limits  ACETAMINOPHEN LEVEL - Abnormal; Notable for the following:    Acetaminophen (Tylenol), Serum <10 (*)    All other components within normal limits  URINE MICROSCOPIC-ADD ON - Abnormal; Notable for the following:    Squamous  Epithelial / LPF MANY (*)    Bacteria, UA FEW (*)    All other components within normal limits  ETHANOL - Abnormal; Notable for the following:    Alcohol, Ethyl (B) 398 (*)    All other components within normal limits  CBC  PREGNANCY, URINE  CBC  SALICYLATE LEVEL  COMPREHENSIVE METABOLIC PANEL  CBG MONITORING, ED   Results for orders placed or performed during the hospital encounter of 06/29/15  CBC  Result Value Ref Range   WBC 5.6 4.0 - 10.5 K/uL   RBC 4.24 3.87 - 5.11 MIL/uL   Hemoglobin 14.0 12.0 - 15.0 g/dL   HCT 59.5 63.8 - 75.6 %   MCV 96.2 78.0 - 100.0 fL   MCH 33.0 26.0 - 34.0 pg   MCHC 34.3 30.0 - 36.0 g/dL   RDW 43.3 29.5 - 18.8 %   Platelets 353 150 - 400 K/uL  Comprehensive metabolic panel  Result Value Ref Range   Sodium 142 135 - 145 mmol/L   Potassium 4.2 3.5 - 5.1 mmol/L   Chloride 103 101 - 111 mmol/L   CO2 24 22 - 32 mmol/L   Glucose, Bld 73 65 - 99 mg/dL   BUN 14 6 - 20 mg/dL   Creatinine, Ser 4.16 0.44 - 1.00 mg/dL   Calcium 9.2 8.9 - 60.6 mg/dL   Total Protein 8.1 6.5 - 8.1 g/dL   Albumin 4.8 3.5 - 5.0 g/dL   AST 41 15 - 41 U/L   ALT 21 14 - 54 U/L   Alkaline Phosphatase 43 38 - 126 U/L   Total Bilirubin 1.4 (H) 0.3 - 1.2 mg/dL   GFR calc non Af Amer >60 >60 mL/min   GFR calc Af Amer >60 >60 mL/min   Anion gap 15 5 - 15  Pregnancy, urine  Result Value Ref Range   Preg Test, Ur NEGATIVE NEGATIVE  Urinalysis, Routine w reflex microscopic (not at [facility])  Result Value Ref Range   Color, Urine YELLOW YELLOW   APPearance CLOUDY (A) CLEAR   Specific Gravity, Urine 1.010 1.005 - 1.030   pH 5.5 5.0 - 8.0   Glucose, UA NEGATIVE NEGATIVE mg/dL   Hgb urine dipstick TRACE (A) NEGATIVE   Bilirubin Urine NEGATIVE NEGATIVE   Ketones, ur NEGATIVE NEGATIVE mg/dL   Protein, ur NEGATIVE NEGATIVE mg/dL   Urobilinogen, UA 0.2 0.0 - 1.0 mg/dL   Nitrite NEGATIVE NEGATIVE   Leukocytes, UA NEGATIVE NEGATIVE  CBC  Result Value Ref Range   WBC 5.1 4.0 - 10.5  K/uL   RBC 4.00 3.87 - 5.11 MIL/uL   Hemoglobin 13.1 12.0 - 15.0 g/dL   HCT 30.1 60.1 - 09.3 %   MCV 96.5 78.0 - 100.0 fL   MCH 32.8 26.0 - 34.0 pg   MCHC 33.9 30.0 - 36.0 g/dL   RDW 23.5 57.3 - 22.0 %   Platelets 280 150 - 400 K/uL  Acetaminophen level  Result  Value Ref Range   Acetaminophen (Tylenol), Serum <10 (L) 10 - 30 ug/mL  Salicylate level  Result Value Ref Range   Salicylate Lvl <4.0 2.8 - 30.0 mg/dL  Comprehensive metabolic panel  Result Value Ref Range   Sodium 142 135 - 145 mmol/L   Potassium 4.1 3.5 - 5.1 mmol/L   Chloride 102 101 - 111 mmol/L   CO2 25 22 - 32 mmol/L   Glucose, Bld 79 65 - 99 mg/dL   BUN 14 6 - 20 mg/dL   Creatinine, Ser 1.61 0.44 - 1.00 mg/dL   Calcium 8.9 8.9 - 09.6 mg/dL   Total Protein 8.1 6.5 - 8.1 g/dL   Albumin 4.6 3.5 - 5.0 g/dL   AST 40 15 - 41 U/L   ALT 20 14 - 54 U/L   Alkaline Phosphatase 43 38 - 126 U/L   Total Bilirubin 1.0 0.3 - 1.2 mg/dL   GFR calc non Af Amer >60 >60 mL/min   GFR calc Af Amer >60 >60 mL/min   Anion gap 15 5 - 15  Urine microscopic-add on  Result Value Ref Range   Squamous Epithelial / LPF MANY (A) RARE   WBC, UA 0-2 <3 WBC/hpf   RBC / HPF 0-2 <3 RBC/hpf   Bacteria, UA FEW (A) RARE  Ethanol  Result Value Ref Range   Alcohol, Ethyl (B) 398 (HH) <5 mg/dL  CBG monitoring, ED  Result Value Ref Range   Glucose-Capillary 81 65 - 99 mg/dL   Comment 1 Notify RN    Comment 2 Document in Chart    Ct Head Wo Contrast  06/29/2015   CLINICAL DATA:  Altered mental status  EXAM: CT HEAD WITHOUT CONTRAST  TECHNIQUE: Contiguous axial images were obtained from the base of the skull through the vertex without intravenous contrast.  COMPARISON:  05/04/2015  FINDINGS: No acute hemorrhage, infarct, or mass lesion is identified. No midline shift. Ventricles are normal in size. Orbits and paranasal sinuses are unremarkable. No skull fracture.  IMPRESSION: Normal head CT.   Electronically Signed   By: Christiana Pellant M.D.   On:  06/29/2015 17:04    Imaging Review Ct Head Wo Contrast  06/29/2015   CLINICAL DATA:  Altered mental status  EXAM: CT HEAD WITHOUT CONTRAST  TECHNIQUE: Contiguous axial images were obtained from the base of the skull through the vertex without intravenous contrast.  COMPARISON:  05/04/2015  FINDINGS: No acute hemorrhage, infarct, or mass lesion is identified. No midline shift. Ventricles are normal in size. Orbits and paranasal sinuses are unremarkable. No skull fracture.  IMPRESSION: Normal head CT.   Electronically Signed   By: Christiana Pellant M.D.   On: 06/29/2015 17:04   I have personally reviewed and evaluated these images and lab results as part of my medical decision-making.   EKG Interpretation None         MDM   Final diagnoses:  Altered mental status  2. Alcohol intoxication  The patient is observed in the ED with improving mental status. She has been ambulatory to and from the bathroom. Neg Head CT, significantly elevated ETOH with long history of the same. Will continue to monitor.  9:25:  She is oriented, cooperative. Feeling nauseous, "a little DTish". Additional fluids provided, zofran.   10:30: She is awake, alert, nausea resolved, VS improved. She has a family member that can come pick her up. Feel she can be discharged home.  Elpidio Anis, PA-C 06/29/15 2252  Elpidio Anis, PA-C  06/29/15 2252  Raeford Razor, MD 06/30/15 [phone]

## 2015-06-29 NOTE — ED Notes (Signed)
Bed: ZO10 Expected date:  Expected time:  Means of arrival:  Comments: EMS/ETOH <l.o.c.

## 2015-06-29 NOTE — ED Notes (Signed)
Pt arrived per [facility]; was found sitting in parking lot by law enforcement.  Strong alcohol smell.  Pt is Alert and responding.  CBG 82 BP 140/100 PR 100 RR 18.

## 2015-06-29 NOTE — Discharge Instructions (Signed)
Alcohol Intoxication Alcohol intoxication occurs when the amount of alcohol that a person has consumed impairs his or her ability to mentally and physically function. Alcohol directly impairs the normal chemical activity of the brain. Drinking large amounts of alcohol can lead to changes in mental function and behavior, and it can cause many physical effects that can be harmful.  Alcohol intoxication can range in severity from mild to very severe. Various factors can affect the level of intoxication that occurs, such as the person's age, gender, weight, frequency of alcohol consumption, and the presence of other medical conditions (such as diabetes, seizures, or heart conditions). Dangerous levels of alcohol intoxication may occur when people drink large amounts of alcohol in a short period (binge drinking). Alcohol can also be especially dangerous when combined with certain prescription medicines or "recreational" drugs. SIGNS AND SYMPTOMS Some common signs and symptoms of mild alcohol intoxication include:  Loss of coordination.  Changes in mood and behavior.  Impaired judgment.  Slurred speech. As alcohol intoxication progresses to more severe levels, other signs and symptoms will appear. These may include:  Vomiting.  Confusion and impaired memory.  Slowed breathing.  Seizures.  Loss of consciousness. DIAGNOSIS  Your health care provider will take a medical history and perform a physical exam. You will be asked about the amount and type of alcohol you have consumed. Blood tests will be done to measure the concentration of alcohol in your blood. In many places, your blood alcohol level must be lower than 80 mg/dL (1.61%) to legally drive. However, many dangerous effects of alcohol can occur at much lower levels.  TREATMENT  People with alcohol intoxication often do not require treatment. Most of the effects of alcohol intoxication are temporary, and they go away as the alcohol naturally  leaves the body. Your health care provider will monitor your condition until you are stable enough to go home. Fluids are sometimes given through an IV access tube to help prevent dehydration.  HOME CARE INSTRUCTIONS  Do not drive after drinking alcohol.  Stay hydrated. Drink enough water and fluids to keep your urine clear or pale yellow. Avoid caffeine.   Only take over-the-counter or prescription medicines as directed by your health care provider.  SEEK MEDICAL CARE IF:   You have persistent vomiting.   You do not feel better after a few days.  You have frequent alcohol intoxication. Your health care provider can help determine if you should see a substance use treatment counselor. SEEK IMMEDIATE MEDICAL CARE IF:   You become shaky or tremble when you try to stop drinking.   You shake uncontrollably (seizure).   You throw up (vomit) blood. This may be bright red or may look like black coffee grounds.   You have blood in your stool. This may be bright red or may appear as a black, tarry, bad smelling stool.   You become lightheaded or faint.  MAKE SURE YOU:   Understand these instructions.  Will watch your condition.  Will get help right away if you are not doing well or get worse. Document Released: 08/01/2005 Document Revised: 06/24/2013 Document Reviewed: 03/27/2013 [facility] Patient Information 2015 [place], [place]. This information is not intended to replace advice given to you by your health care provider. Make sure you discuss any questions you have with your health care provider.  Alcohol and Nutrition Nutrition serves two purposes. It provides energy. It also maintains body structure and function. Food supplies energy. It also provides the building blocks  needed to replace worn or damaged cells. Alcoholics often eat poorly. This limits their supply of essential nutrients. This affects energy supply and structure maintenance. Alcohol also affects the body's  nutrients in:  Digestion.  Storage.  Using and getting rid of waste products. IMPAIRMENT OF NUTRIENT DIGESTION AND UTILIZATION   Once ingested, food must be broken down into small components (digested). Then it is available for energy. It helps maintain body structure and function. Digestion begins in the mouth. It continues in the stomach and intestines, with help from the pancreas. The nutrients from digested food are absorbed from the intestines into the blood. Then they are carried to the liver. The liver prepares nutrients for:  Immediate use.  Storage and future use.  Alcohol inhibits the breakdown of nutrients into usable molecules.  It decreases secretion of digestive enzymes from the pancreas.  Alcohol impairs nutrient absorption by damaging the cells lining the stomach and intestines.  It also interferes with moving some nutrients into the blood.  In addition, nutritional deficiencies themselves may lead to further absorption problems.  For example, folate deficiency changes the cells that line the small intestine. This impairs how water is absorbed. It also affects absorbed nutrients. These include glucose, sodium, and additional folate.  Even if nutrients are digested and absorbed, alcohol can prevent them from being fully used. It changes their transport, storage, and excretion. Impaired utilization of nutrients by alcoholics is indicated by:  Decreased liver stores of vitamins, such as vitamin A.  Increased excretion of nutrients such as fat. ALCOHOL AND ENERGY SUPPLY   Three basic nutritional components found in food are:  Carbohydrates.  Proteins.  Fats.  These are used as energy. Some alcoholics take in as much as 50% of their total daily calories from alcohol. They often neglect important foods.  Even when enough food is eaten, alcohol can impair the ways the body controls blood sugar (glucose) levels. It may either increase or decrease blood sugar.  In  non-diabetic alcoholics, increased blood sugar (hyperglycemia) is caused by poor insulin secretion. It is usually temporary.  Decreased blood sugar (hypoglycemia) can cause serious injury even if this condition is short-lived. Low blood sugar can happen when a fasting or malnourished person drinks alcohol. When there is no food to supply energy, stored sugar is used up. The products of alcohol inhibit forming glucose from other compounds such as amino acids. As a result, alcohol causes the brain and other body tissue to lack glucose. It is needed for energy and function.  Alcohol is an energy source. But how the body processes and uses the energy from alcohol is complex. Also, when alcohol is substituted for carbohydrates, subjects tend to lose weight. This indicates that they get less energy from alcohol than from food. ALCOHOL - MAINTAINING CELL STRUCTURE AND FUNCTION  Structure Cells are made mostly of protein. So an adequate protein diet is important for maintaining cell structure. This is especially true if cells are being damaged. Research indicates that alcohol affects protein nutrition by causing impaired:  Digestion of proteins to amino acids.  Processing of amino acids by the small intestine and liver.  Synthesis of proteins from amino acids.  Protein secretion by the liver. Function Nutrients are essential for the body to function well. They provide the tools that the body needs to work well:   Proteins.  Vitamins.  Minerals. Alcohol can disrupt body function. It may cause nutrient deficiencies. And it may interfere with the way nutrients are processed.  Vitamins  Vitamins are essential to maintain growth and normal metabolism. They regulate many of the body`s processes. Chronic heavy drinking causes deficiencies in many vitamins. This is caused by eating less. And, in some cases, vitamins may be poorly absorbed. For example, alcohol inhibits fat absorption. It impairs how the  vitamins A, E, and D are normally absorbed along with dietary fats. Not enough vitamin A may cause night blindness. Not enough vitamin D may cause softening of the bones.  Some alcoholics lack vitamins A, C, D, E, K, and the B vitamins. These are all involved in wound healing and cell maintenance. In particular, because vitamin K is necessary for blood clotting, lacking that vitamin can cause delayed clotting. The result is excess bleeding. Lacking other vitamins involved in brain function may cause severe neurological damage. Minerals Deficiencies of minerals such as calcium, magnesium, iron, and zinc are common in alcoholics. The alcohol itself does not seem to affect how these minerals are absorbed. Rather, they seem to occur secondary to other alcohol-related problems, such as:  Less calcium absorbed.  Not enough magnesium.  More urinary excretion.  Vomiting.  Diarrhea.  Not enough iron due to gastrointestinal bleeding.  Not enough zinc or losses related to other nutrient deficiencies.  Mineral deficiencies can cause a variety of medical consequences. These range from calcium-related bone disease to zinc-related night blindness and skin lesions. ALCOHOL, MALNUTRITION, AND MEDICAL COMPLICATIONS  Liver Disease   Alcoholic liver damage is caused primarily by alcohol itself. But poor nutrition may increase the risk of alcohol-related liver damage. For example, nutrients normally found in the liver are known to be affected by drinking alcohol. These include carotenoids, which are the major sources of vitamin A, and vitamin E compounds. Decreases in such nutrients may play some role in alcohol-related liver damage. Pancreatitis  Research suggests that malnutrition may increase the risk of developing alcoholic pancreatitis. Research suggests that a diet lacking in protein may increase alcohol's damaging effect on the pancreas. Brain  Nutritional deficiencies may have severe effects on  brain function. These may be permanent. Specifically, thiamine deficiencies are often seen in alcoholics. They can cause severe neurological problems. These include:  Impaired movement.  Memory loss seen in Wernicke-Korsakoff syndrome. Pregnancy  Alcohol has toxic effects on fetal development. It causes alcohol-related birth defects. They include fetal alcohol syndrome. Alcohol itself is toxic to the fetus. Also, the nutritional deficiency can affect how the fetus develops. That may compound the risk of developmental damage.  Nutritional needs during pregnancy are 10% to 30% greater than normal. Food intake can increase by as much as 140% to cover the needs of both mother and fetus. An alcoholic mother`s nutritional problems may adversely affect the nutrition of the fetus. And alcohol itself can also restrict nutrition flow to the fetus. NUTRITIONAL STATUS OF ALCOHOLICS  Techniques for assessing nutritional status include:  Taking body measurements to estimate fat reserves. They include:  Weight.  Height.  Mass.  Skin fold thickness.  Performing blood analysis to provide measurements of circulating:  Proteins.  Vitamins.  Minerals.  These techniques tend to be imprecise. For many nutrients, there is no clear "cut-off" point that would allow an accurate definition of deficiency. So assessing the nutritional status of alcoholics is limited by these techniques. Dietary status may provide information about the risk of developing nutritional problems. Dietary status is assessed by:  Taking patients' dietary histories.  Evaluating the amount and types of food they are eating.  It is difficult  to determine what exact amount of alcohol begins to have damaging effects on nutrition. In general, moderate drinkers have 2 drinks or less per day. They seem to be at little risk for nutritional problems. Various medical disorders begin to appear at greater levels.  Research indicates that the  majority of even the heaviest drinkers have few obvious nutritional deficiencies. Many alcoholics who are hospitalized for medical complications of their disease do have severe malnutrition. Alcoholics tend to eat poorly. Often they eat less than the amounts of food necessary to provide enough:  Carbohydrates.  Protein.  Fat.  Vitamins A and C.  B vitamins.  Minerals like calcium and iron. Of major concern is alcohol's effect on digesting food and use of nutrients. It may shift a mildly malnourished person toward severe malnutrition. Document Released: 08/16/2005 Document Revised: 01/14/2012 Document Reviewed: 01/30/2006 [facility] Patient Information 2015 [place], [place]. This information is not intended to replace advice given to you by your health care provider. Make sure you discuss any questions you have with your health care provider.

## 2015-06-29 NOTE — ED Notes (Signed)
Pt not answering RN questions at this time.  Denies pain and drug use.  Pt is tearful.

## 2016-09-07 ENCOUNTER — Emergency Department (HOSPITAL_COMMUNITY): Payer: Self-pay

## 2016-09-07 ENCOUNTER — Emergency Department (HOSPITAL_COMMUNITY)
Admission: EM | Admit: 2016-09-07 | Discharge: 2016-09-08 | Disposition: A | Discharge status: Home / Self Care | Payer: Self-pay | Attending: Emergency Medicine | Admitting: Emergency Medicine

## 2016-09-07 ENCOUNTER — Encounter (HOSPITAL_COMMUNITY): Payer: Self-pay | Admitting: Emergency Medicine

## 2016-09-07 DIAGNOSIS — F10129 Alcohol abuse with intoxication, unspecified: Secondary | ICD-10-CM | POA: Insufficient documentation

## 2016-09-07 DIAGNOSIS — Z5181 Encounter for therapeutic drug level monitoring: Secondary | ICD-10-CM | POA: Insufficient documentation

## 2016-09-07 DIAGNOSIS — R4020 Unspecified coma: Secondary | ICD-10-CM | POA: Insufficient documentation

## 2016-09-07 DIAGNOSIS — Z7982 Long term (current) use of aspirin: Secondary | ICD-10-CM | POA: Insufficient documentation

## 2016-09-07 DIAGNOSIS — F10120 Alcohol abuse with intoxication, uncomplicated: Secondary | ICD-10-CM

## 2016-09-07 LAB — COMPREHENSIVE METABOLIC PANEL
ALT: 39 U/L (ref 14–54)
AST: 40 U/L (ref 15–41)
Albumin: 5.1 g/dL — ABNORMAL HIGH (ref 3.5–5.0)
Alkaline Phosphatase: 38 U/L (ref 38–126)
Anion gap: 15 (ref 5–15)
BUN: 15 mg/dL (ref 6–20)
CO2: 26 mmol/L (ref 22–32)
Calcium: 9.8 mg/dL (ref 8.9–10.3)
Chloride: 104 mmol/L (ref 101–111)
Creatinine, Ser: 0.69 mg/dL (ref 0.44–1.00)
GFR calc Af Amer: >60 mL/min (ref >60)
GFR calc non Af Amer: >60 mL/min (ref >60)
Glucose, Bld: 94 mg/dL (ref 65–99)
Potassium: 3.5 mmol/L (ref 3.5–5.1)
Sodium: 145 mmol/L (ref 135–145)
Total Bilirubin: 1.9 mg/dL — ABNORMAL HIGH (ref 0.3–1.2)
Total Protein: 7.5 g/dL (ref 6.5–8.1)

## 2016-09-07 LAB — CBC
HCT: 42.2 % (ref 36.0–46.0)
Hemoglobin: 14.4 g/dL (ref 12.0–15.0)
MCH: 31.9 pg (ref 26.0–34.0)
MCHC: 34.1 g/dL (ref 30.0–36.0)
MCV: 93.4 fL (ref 78.0–100.0)
Platelets: 274 10*3/uL (ref 150–400)
RBC: 4.52 MIL/uL (ref 3.87–5.11)
RDW: 13.3 % (ref 11.5–15.5)
WBC: 6.4 10*3/uL (ref 4.0–10.5)

## 2016-09-07 LAB — ETHANOL: Alcohol, Ethyl (B): 544 mg/dL (ref <5)

## 2016-09-07 MED ORDER — SODIUM CHLORIDE 0.9 % IV BOLUS (SEPSIS): 1000.0000 mL | Freq: Once | INTRAVENOUS | Status: DC

## 2016-09-07 NOTE — ED Notes (Signed)
Pt assessed for gag reflex. None noted. Pt moved to ResB so she can be monitored more closely

## 2016-09-07 NOTE — ED Notes (Addendum)
Minimal response to painful stimuli.  Suction set up at the bedside.  Rhonchi auscultated in b/l lung fields.  Dr. Denton LankSteinl is aware.  Will hold off on IVF at this time.

## 2016-09-07 NOTE — ED Triage Notes (Signed)
Per EMS pt from home was called by mother states she came downstairs and pt had emypty bottle of wine and 5th of whiskey beside her. Pt has hx of overdose on alcohol. EMS states pt will grimace with sternal rub.

## 2016-09-07 NOTE — ED Notes (Signed)
Patient transported to CT 

## 2016-09-07 NOTE — ED Provider Notes (Signed)
[facility]-EMERGENCY DEPT Provider Note   CSN: [id number] Arrival date & time: 09/07/16  2036     History   Chief Complaint Chief Complaint  Patient presents with  . Alcohol Intoxication    HPI Kelsey [name] is a 45 y.o. female.  Patient presents via ems with alcohol intoxication.  Per report had consumed a large quantity of alcohol at home, prior to arrival tonight.  No report of trauma or fall.  Patient not cooperative or verbally responsive to questions - level 5 caveat.    The history is provided by the patient and the EMS personnel. The history is limited by the condition of the patient.  Alcohol Intoxication     Past Medical History:  Diagnosis Date  . Acute ventilatory dependent respiratory failure 04/23/2015  . Alcohol abuse    History of 5 DWIs.  . Depression   . Intentional overdose of multiple pharmaceuticals 04/21/2015  . Normocytic anemia 04/23/2015  . 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 Suicide attempt     Patient Active Problem List   Diagnosis Date Noted  . AKI (acute kidney injury) (HCC)   . Hypernatremia   . Anemia of chronic disease   . Acute alcohol intoxication (HCC) 05/04/2015  . Alcohol use disorder, severe, dependence (HCC)   . Major depressive disorder, recurrent episode, moderate (HCC)   . Acute encephalopathy 04/23/2015  . Metabolic acidosis 04/23/2015  . Hyperglycemia 04/23/2015  . Hypokalemia 04/23/2015  . Normocytic anemia 04/23/2015  . Lactic acidosis 04/23/2015  . Acute ventilatory dependent respiratory failure 04/23/2015  . Hypotension with circulatory shock 04/23/2015  . Hyperchloremia 04/23/2015  . Polysubstance abuse 04/22/2015  . 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 Intentional overdose of multiple pharmaceuticals 04/21/2015  . GAD (generalized anxiety disorder) 05/18/2014  . Alcohol dependence (HCC) 05/15/2014    Past Surgical History:  Procedure Laterality Date    . COSMETIC SURGERY      OB History    No data available       Home Medications    Prior to Admission medications   Medication Sig Start Date End Date Taking? Authorizing Provider  aspirin EC 81 MG tablet Take 1 tablet (81 mg total) by mouth daily. For heart health Patient not taking: Reported on 10/08/2014 05/18/14   Sanjuana KavaAgnes I Nwoko, NP  ferrous sulfate 325 (65 FE) MG tablet Take 1 tablet (325 mg total) by mouth daily with breakfast. For low iron Patient not taking: Reported on 10/08/2014 05/18/14   Sanjuana KavaAgnes I Nwoko, NP  folic acid (FOLVITE) 1 MG tablet Take 1 tablet (1 mg total) by mouth daily. Patient not taking: Reported on 06/29/2015 05/05/15   Alison MurrayAlma M Devine, MD  Multiple Vitamin (MULTIVITAMIN WITH MINERALS) TABS tablet Take 1 tablet by mouth daily. For low vitamin Patient not taking: Reported on 05/04/2015 05/18/14   Sanjuana KavaAgnes I Nwoko, NP  thiamine 100 MG tablet Take 1 tablet (100 mg total) by mouth daily. Patient not taking: Reported on 06/29/2015 05/05/15   Alison MurrayAlma M Devine, MD    Family History No family history on file.  Social History Social History  Substance Use Topics  . Smoking status: Never Smoker  . Smokeless tobacco: Not on file  . Alcohol use 18.0 oz/week    30 Glasses of wine per week     Allergies   Review of patient's allergies indicates no known allergies.   Review of Systems Review of Systems  Unable to perform ROS: Patient unresponsive  level  5 caveat.      Physical Exam Updated Vital Signs BP 107/57 (BP Location: Right Arm)   Pulse 114   Resp 14   SpO2 93%   Physical Exam  Constitutional: She appears well-developed and well-nourished. No distress.  HENT:  Head: Atraumatic.  Mouth/Throat: Oropharynx is clear and moist.  Eyes: Conjunctivae are normal. Pupils are equal, round, and reactive to light. No scleral icterus.  Neck: Neck supple. No tracheal deviation present.  Cardiovascular: Normal rate, regular rhythm, normal heart sounds and intact distal  pulses.   Pulmonary/Chest: Effort normal and breath sounds normal. No respiratory distress.  Abdominal: Soft. Normal appearance and bowel sounds are normal. She exhibits no distension. There is no tenderness.  Musculoskeletal: She exhibits no edema.  Neurological:  Intoxicated. Opens eyes to command and stimuli.  Uncooperative.   Skin: Skin is warm and dry. No rash noted.  Psychiatric:  Intoxicated, uncooperative.   Nursing note and vitals reviewed.    ED Treatments / Results  Labs (all labs ordered are listed, but only abnormal results are displayed) Results for orders placed or performed during the hospital encounter of 09/07/16  CBC  Result Value Ref Range   WBC 6.4 4.0 - 10.5 K/uL   RBC 4.52 3.87 - 5.11 MIL/uL   Hemoglobin 14.4 12.0 - 15.0 g/dL   HCT 16.142.2 09.636.0 - 04.546.0 %   MCV 93.4 78.0 - 100.0 fL   MCH 31.9 26.0 - 34.0 pg   MCHC 34.1 30.0 - 36.0 g/dL   RDW 40.913.3 81.111.5 - 91.415.5 %   Platelets 274 150 - 400 K/uL  Comprehensive metabolic panel  Result Value Ref Range   Sodium 145 135 - 145 mmol/L   Potassium 3.5 3.5 - 5.1 mmol/L   Chloride 104 101 - 111 mmol/L   CO2 26 22 - 32 mmol/L   Glucose, Bld 94 65 - 99 mg/dL   BUN 15 6 - 20 mg/dL   Creatinine, Ser 7.820.69 0.44 - 1.00 mg/dL   Calcium 9.8 8.9 - 95.610.3 mg/dL   Total Protein 7.5 6.5 - 8.1 g/dL   Albumin 5.1 (H) 3.5 - 5.0 g/dL   AST 40 15 - 41 U/L   ALT 39 14 - 54 U/L   Alkaline Phosphatase 38 38 - 126 U/L   Total Bilirubin 1.9 (H) 0.3 - 1.2 mg/dL   GFR calc non Af Amer >60 >60 mL/min   GFR calc Af Amer >60 >60 mL/min   Anion gap 15 5 - 15  Ethanol  Result Value Ref Range   Alcohol, Ethyl (B) 544 (HH) <5 mg/dL   Ct Head Wo Contrast  Result Date: 09/07/2016 CLINICAL DATA:  Found unresponsive. EXAM: CT HEAD WITHOUT CONTRAST TECHNIQUE: Contiguous axial images were obtained from the base of the skull through the vertex without intravenous contrast. COMPARISON:  06/29/2015 FINDINGS: Brain: No evidence of acute infarction,  hemorrhage, hydrocephalus, extra-axial collection or mass lesion/mass effect. Gray matter and white matter are unremarkable, with normal differentiation. Normal brain volume for age. Vascular: No hyperdense vessel or unexpected calcification. Skull: Normal. Negative for fracture or focal lesion. Sinuses/Orbits: No acute finding. Other: None. IMPRESSION: Normal brain Electronically Signed   By: Ellery Plunkaniel R Mitchell M.D.   On: 09/07/2016 22:27    EKG  EKG Interpretation None       Radiology No results found.  Procedures Procedures (including critical care time)  Medications Ordered in ED Medications  sodium chloride 0.9 % bolus 1,000 mL (not administered)  Initial Impression / Assessment and Plan / ED Course  I have reviewed the triage vital signs and the nursing notes.  Pertinent labs & imaging results that were available during my care of the patient were reviewed by me and considered in my medical decision making (see chart for details).  Clinical Course    Iv ns.   Labs.   Reviewed nursing notes and prior charts for additional history.   Given degree unresponsiveness, and initial unclear hx (parent not yet in ED), sent for CT head to r/o acute process.  CT neg acute.  Etoh returns very high.   Will observe in ED, and re-assess when more sober.   Recheck 2321, pt resting quietly. Hr 96, bp normal, pulse ox 96% ra.   Signed out to Dr Nicanor Alcon, to re-assess when more alert/sober.     Final Clinical Impressions(s) / ED Diagnoses   Final diagnoses:  None    New Prescriptions New Prescriptions   No medications on file     Cathren Laine, MD 09/07/16 2322

## 2016-09-07 NOTE — ED Notes (Signed)
[facility] Expected date:  Expected time:  Means of arrival:  Comments: EMS intoxication

## 2016-09-08 LAB — RAPID URINE DRUG SCREEN, HOSP PERFORMED
Amphetamines: NOT DETECTED
Barbiturates: NOT DETECTED
Benzodiazepines: NOT DETECTED
Cocaine: NOT DETECTED
Opiates: NOT DETECTED
Tetrahydrocannabinol: NOT DETECTED

## 2016-09-08 LAB — ETHANOL: Alcohol, Ethyl (B): 346 mg/dL (ref <5)

## 2016-09-08 MED ORDER — SODIUM CHLORIDE 0.9 % IV BOLUS (SEPSIS)
1000.0000 mL | Freq: Once | INTRAVENOUS | Status: AC
  Administered 2016-09-08: 1000 mL via INTRAVENOUS

## 2016-09-08 MED ORDER — SODIUM CHLORIDE 0.9 % IV BOLUS (SEPSIS)
1000.0000 mL | Freq: Once | INTRAVENOUS | Status: AC
Start: 2016-09-08 — End: 2016-09-08
  Administered 2016-09-08: 1000 mL via INTRAVENOUS

## 2016-09-08 NOTE — ED Provider Notes (Signed)
1:59 PM Plan of care for patient is to reassess for clinical sobriety after a period of observation and fluids. Patient had repeat alcohol level at 8 AM which was 346. Patient was assessed at that time and still was clinically intoxicated. Patient was given more fluids and allowed to rest. Patient had blood pressures in the 80s and 90s systolic and was given more fluids. Patient had resolution of soft blood pressures.  Patient was reassessed and had significant improvement in sobriety. Patient alert and oriented and had no complaints. Patient had no headache, vision changes, or neurologic complaints. She only reported mild nausea.  Patient able to tolerate by mouth without difficulty.  Patient was walked by nursing without gait abnormality or lightheadedness. Patient appears to now be clinically sober. Given significant elevation of alcohol overnight, patient will have family member drive her home. Patient will be given resources for alcohol abuse however she does not feel that she is ready to quit at this time.  Patient reports that her I'll call intake was not an attempt to hurt herself. She has no SI and no HI.  Patient given return precautions for any new or worsening symptoms. Patient discharged in good condition to family.    Clinical Impression: 1. Very severe alcohol intoxication without complication (HCC)     Disposition: Discharge  Condition: Good  I have discussed the results, Dx and Tx plan with the pt(& family if present). He/she/they expressed understanding and agree(s) with the plan. Discharge instructions discussed at great length. Strict return precautions discussed and pt &/or family have verbalized understanding of the instructions. No further questions at time of discharge.    New Prescriptions   No medications on file    Follow Up: No follow-up provider specified.    Heide Scaleshristopher J Tegeler, MD 09/08/16 1900

## 2016-09-08 NOTE — ED Notes (Signed)
Pt leaving when her mother comes to get her

## 2016-09-08 NOTE — ED Notes (Signed)
Pt able to ambulate steadily without assistance. Pt is calling her mother to come get her when she is discharged

## 2016-12-22 IMAGING — CT CT HEAD W/O CM
3 of 4 series · 16 of 47 positions shown, 19 images · non-contrast
Comparison: 06/29/2015

CLINICAL DATA: Found unresponsive.

EXAM:
CT HEAD WITHOUT CONTRAST
TECHNIQUE: Contiguous axial images were obtained from the base of the skull
through the vertex without intravenous contrast.

[Series 2: head w/o · axial · non-contrast · 0.42mm/px · z∈[+1371,+1496]mm · 10 of 31 slices shown, 13 images]
[im 3/31  brain]
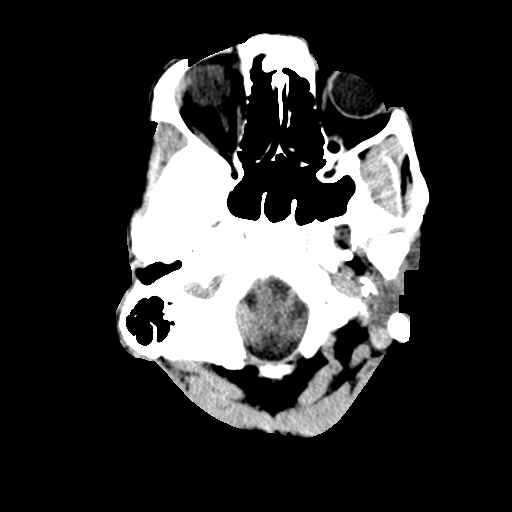
[im 3/31  bone]
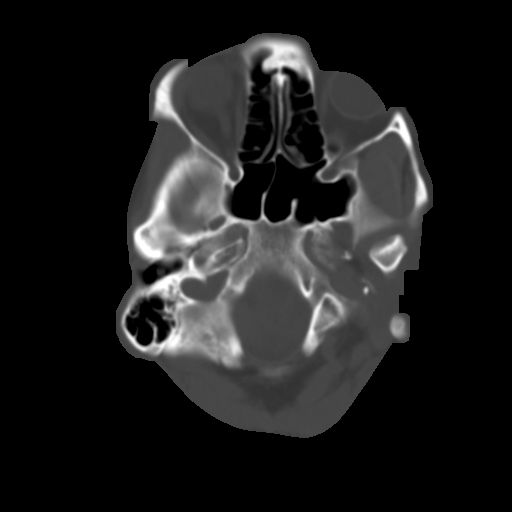
[im 5/31  brain]
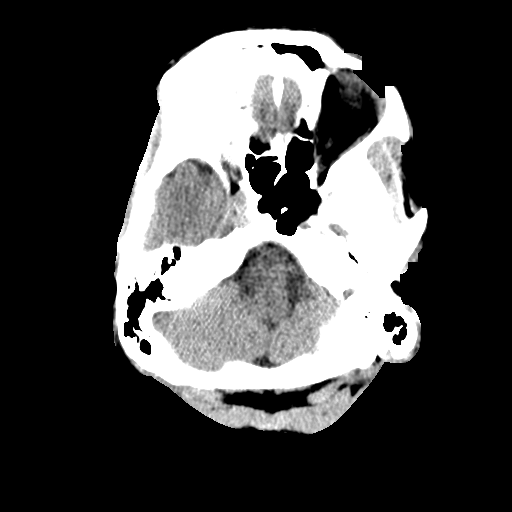
[im 9/31  brain]
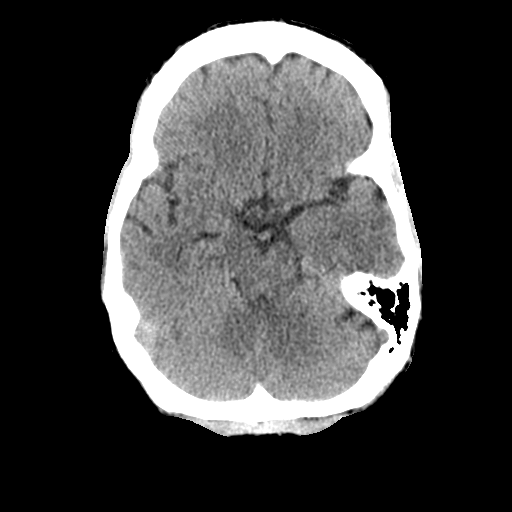
[im 11/31  brain]
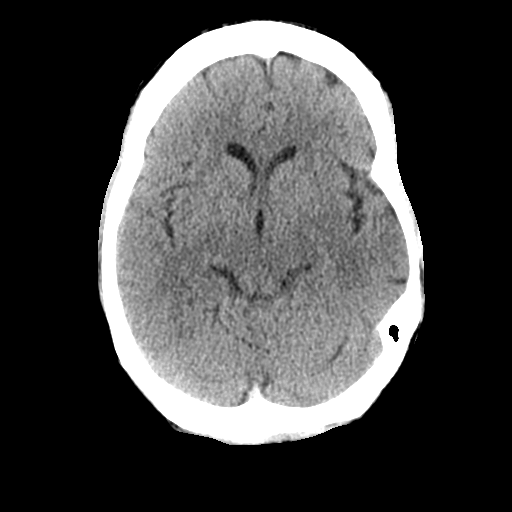
[im 13/31  brain]
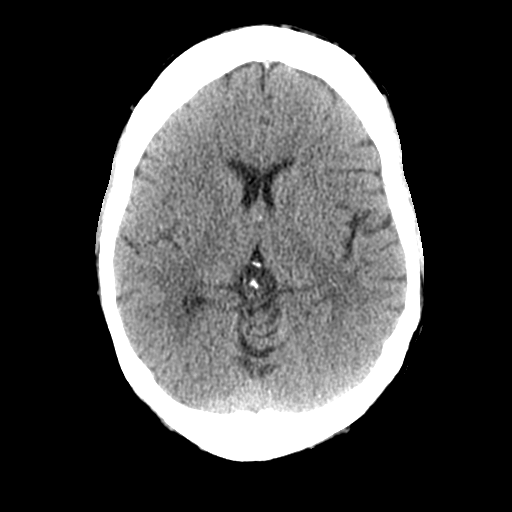
[im 13/31  bone]
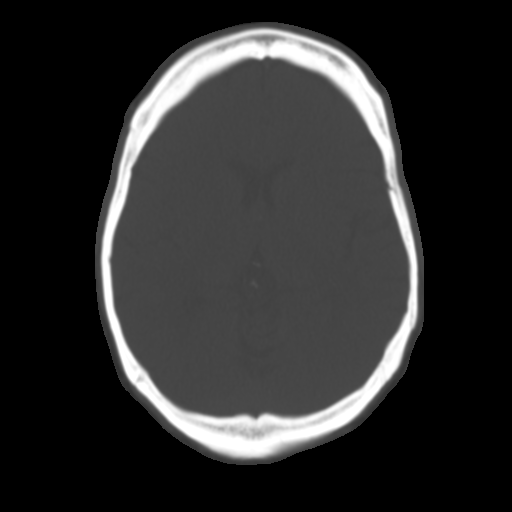
[im 18/31  brain]
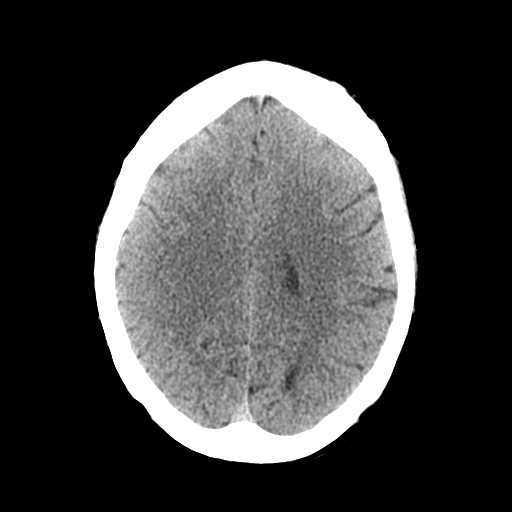
[im 20/31  brain]
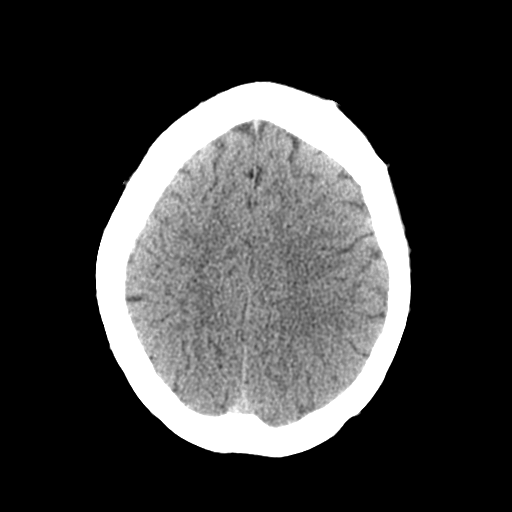
[im 22/31  brain]
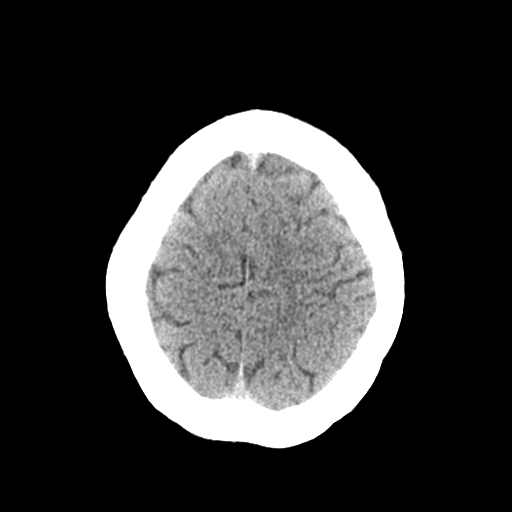
[im 26/31  brain]
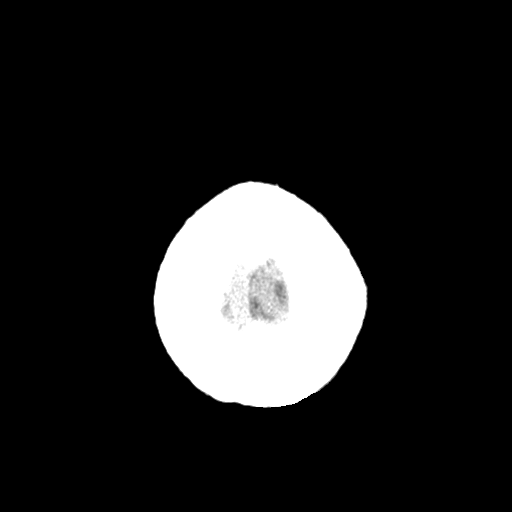
[im 26/31  bone]
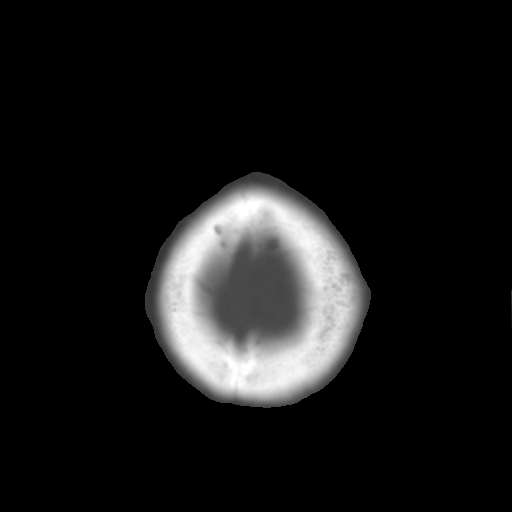
[im 28/31  brain]
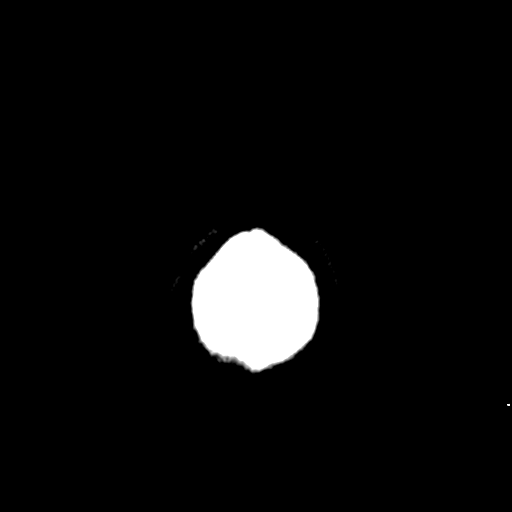

[Series 5: coronal · coronal · 0.29mm/px · 3 of 67 slices shown]
[im 23/67  brain]
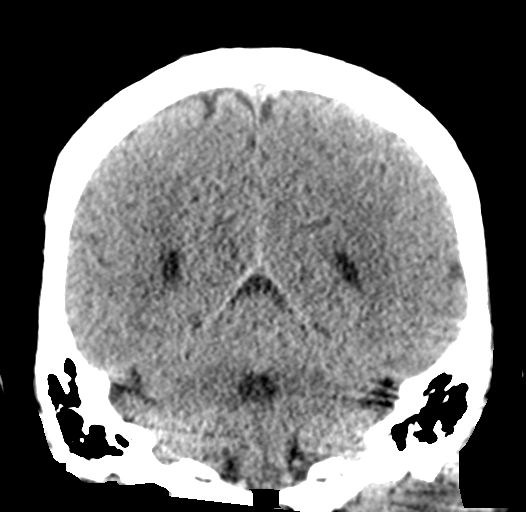
[im 30/67  brain]
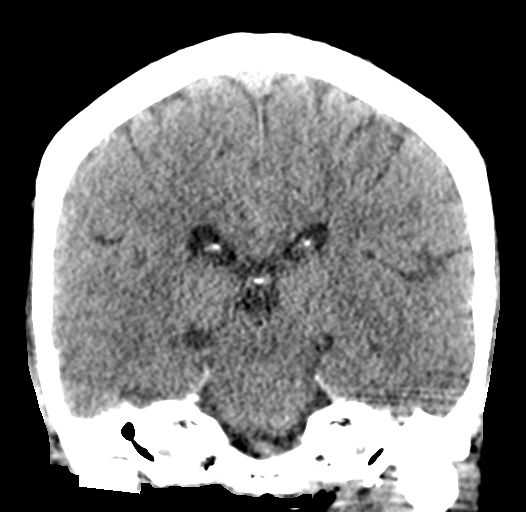
[im 37/67  brain]
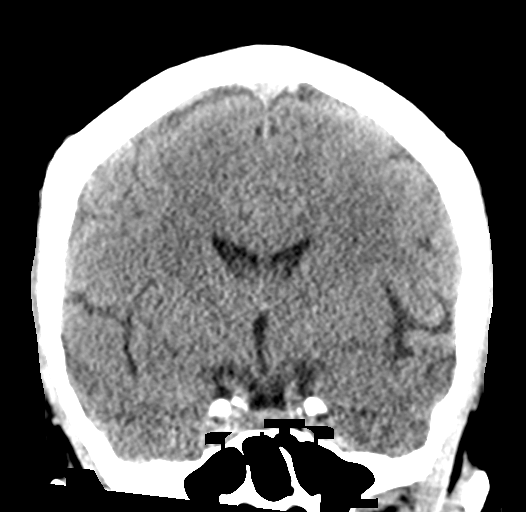

[Series 6: sagittal · sagittal · 0.29mm/px · 3 of 53 slices shown]
[im 18/53  brain]
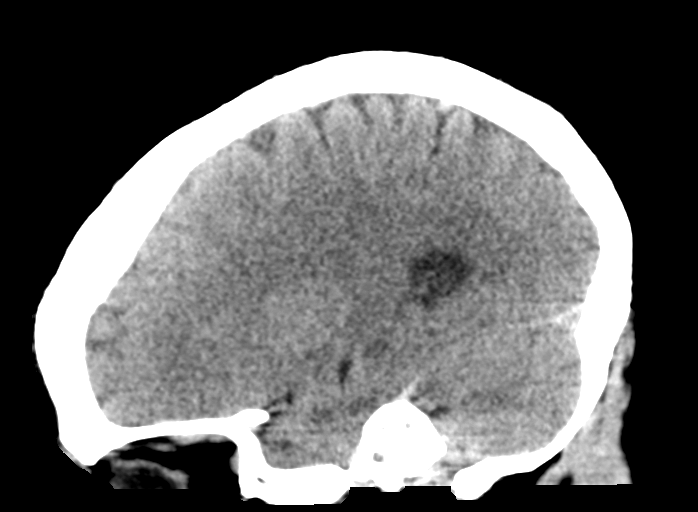
[im 27/53  brain]
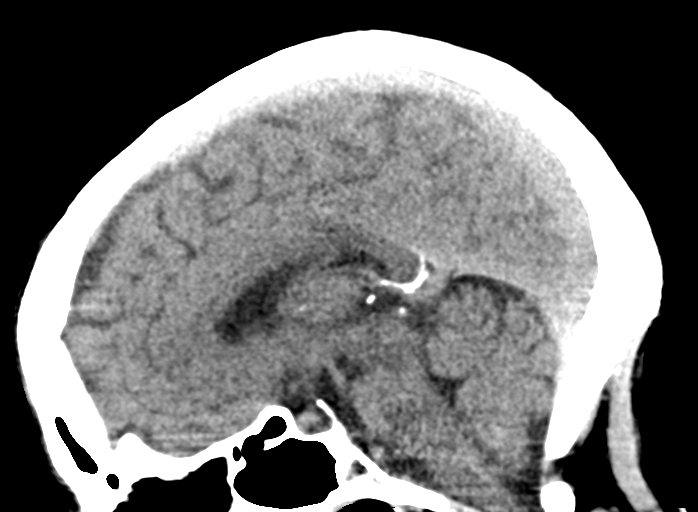
[im 35/53  brain]
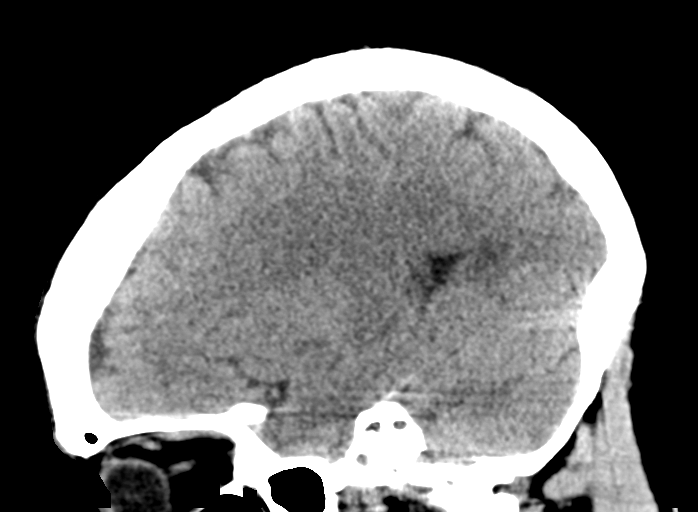

[16 of 47 positions shown; findings below may reference images not displayed]

FINDINGS: Brain: No evidence of acute infarction, hemorrhage, hydrocephalus,
extra-axial collection or mass lesion/mass effect. Gray matter and
white matter are unremarkable, with normal differentiation. Normal
brain volume for age.

Vascular: No hyperdense vessel or unexpected calcification.

Skull: Normal. Negative for fracture or focal lesion.

Sinuses/Orbits: No acute finding.

Other: None.
IMPRESSION: Normal brain

## 2017-06-01 ENCOUNTER — Encounter (HOSPITAL_COMMUNITY): Payer: Self-pay

## 2017-06-01 ENCOUNTER — Emergency Department (HOSPITAL_COMMUNITY): Payer: Self-pay

## 2017-06-01 ENCOUNTER — Inpatient Hospital Stay (HOSPITAL_COMMUNITY)
Admission: EM | Admit: 2017-06-01 | Discharge: 2017-06-03 | DRG: 433 | Disposition: A | Discharge status: Home / Self Care | Payer: Self-pay | Attending: Family Medicine | Admitting: Family Medicine

## 2017-06-01 DIAGNOSIS — W109XXA Fall (on) (from) unspecified stairs and steps, initial encounter: Secondary | ICD-10-CM | POA: Diagnosis present

## 2017-06-01 DIAGNOSIS — R7401 Elevation of levels of liver transaminase levels: Secondary | ICD-10-CM

## 2017-06-01 DIAGNOSIS — Z9889 Other specified postprocedural states: Secondary | ICD-10-CM

## 2017-06-01 DIAGNOSIS — K7010 Alcoholic hepatitis without ascites: Principal | ICD-10-CM | POA: Diagnosis present

## 2017-06-01 DIAGNOSIS — E872 Acidosis, unspecified: Secondary | ICD-10-CM

## 2017-06-01 DIAGNOSIS — F1020 Alcohol dependence, uncomplicated: Secondary | ICD-10-CM | POA: Diagnosis present

## 2017-06-01 DIAGNOSIS — E8729 Other acidosis: Secondary | ICD-10-CM | POA: Diagnosis present

## 2017-06-01 DIAGNOSIS — Z7982 Long term (current) use of aspirin: Secondary | ICD-10-CM

## 2017-06-01 DIAGNOSIS — Z79899 Other long term (current) drug therapy: Secondary | ICD-10-CM

## 2017-06-01 DIAGNOSIS — S01511A Laceration without foreign body of lip, initial encounter: Secondary | ICD-10-CM | POA: Diagnosis present

## 2017-06-01 DIAGNOSIS — W19XXXA Unspecified fall, initial encounter: Secondary | ICD-10-CM

## 2017-06-01 DIAGNOSIS — Z915 Personal history of self-harm: Secondary | ICD-10-CM

## 2017-06-01 DIAGNOSIS — R740 Nonspecific elevation of levels of transaminase and lactic acid dehydrogenase [LDH]: Secondary | ICD-10-CM

## 2017-06-01 DIAGNOSIS — E860 Dehydration: Secondary | ICD-10-CM

## 2017-06-01 LAB — CBC
HCT: 37 % (ref 36.0–46.0)
Hemoglobin: 12.8 g/dL (ref 12.0–15.0)
MCH: 31.9 pg (ref 26.0–34.0)
MCHC: 34.6 g/dL (ref 30.0–36.0)
MCV: 92.3 fL (ref 78.0–100.0)
Platelets: 175 10*3/uL (ref 150–400)
RBC: 4.01 MIL/uL (ref 3.87–5.11)
RDW: 13.9 % (ref 11.5–15.5)
WBC: 12.8 10*3/uL — ABNORMAL HIGH (ref 4.0–10.5)

## 2017-06-01 LAB — ETHANOL: Alcohol, Ethyl (B): <5 mg/dL (ref <5)

## 2017-06-01 MED ORDER — ONDANSETRON HCL 4 MG/2ML IJ SOLN: 4.0000 mg | Freq: Once | INTRAMUSCULAR | Status: DC

## 2017-06-01 NOTE — ED Notes (Signed)
Bed: [facility] Expected date:  Expected time:  Means of arrival:  Comments: Triage 

## 2017-06-01 NOTE — ED Notes (Signed)
Pt is unable to urinate at this time. 

## 2017-06-01 NOTE — ED Triage Notes (Signed)
Stop drinking Friday and now nausea and fell with vomiting off and on with weakness.

## 2017-06-02 ENCOUNTER — Encounter (HOSPITAL_COMMUNITY): Payer: Self-pay | Admitting: Internal Medicine

## 2017-06-02 ENCOUNTER — Inpatient Hospital Stay (HOSPITAL_COMMUNITY): Payer: Self-pay

## 2017-06-02 ENCOUNTER — Emergency Department (HOSPITAL_COMMUNITY): Payer: Self-pay

## 2017-06-02 DIAGNOSIS — R740 Nonspecific elevation of levels of transaminase and lactic acid dehydrogenase [LDH]: Secondary | ICD-10-CM

## 2017-06-02 DIAGNOSIS — K7010 Alcoholic hepatitis without ascites: Secondary | ICD-10-CM | POA: Diagnosis present

## 2017-06-02 DIAGNOSIS — E860 Dehydration: Secondary | ICD-10-CM

## 2017-06-02 LAB — BASIC METABOLIC PANEL
Anion gap: 13 (ref 5–15)
BUN: 20 mg/dL (ref 6–20)
CO2: 24 mmol/L (ref 22–32)
Calcium: 7.9 mg/dL — ABNORMAL LOW (ref 8.9–10.3)
Chloride: 103 mmol/L (ref 101–111)
Creatinine, Ser: 0.75 mg/dL (ref 0.44–1.00)
GFR calc Af Amer: >60 mL/min (ref >60)
GFR calc non Af Amer: >60 mL/min (ref >60)
Glucose, Bld: 141 mg/dL — ABNORMAL HIGH (ref 65–99)
Potassium: 4 mmol/L (ref 3.5–5.1)
Sodium: 140 mmol/L (ref 135–145)

## 2017-06-02 LAB — RAPID URINE DRUG SCREEN, HOSP PERFORMED
Amphetamines: NOT DETECTED
Barbiturates: NOT DETECTED
Benzodiazepines: NOT DETECTED
Cocaine: NOT DETECTED
Opiates: NOT DETECTED
Tetrahydrocannabinol: NOT DETECTED

## 2017-06-02 LAB — I-STAT CG4 LACTIC ACID, ED: Lactic Acid, Venous: 4.27 mmol/L (ref 0.5–1.9)

## 2017-06-02 LAB — COMPREHENSIVE METABOLIC PANEL
ALT: 101 U/L — ABNORMAL HIGH (ref 14–54)
AST: 240 U/L — ABNORMAL HIGH (ref 15–41)
Albumin: 4.7 g/dL (ref 3.5–5.0)
Alkaline Phosphatase: 37 U/L — ABNORMAL LOW (ref 38–126)
Anion gap: 26 — ABNORMAL HIGH (ref 5–15)
BUN: 26 mg/dL — ABNORMAL HIGH (ref 6–20)
CO2: 18 mmol/L — ABNORMAL LOW (ref 22–32)
Calcium: 9.1 mg/dL (ref 8.9–10.3)
Chloride: 96 mmol/L — ABNORMAL LOW (ref 101–111)
Creatinine, Ser: 0.96 mg/dL (ref 0.44–1.00)
GFR calc Af Amer: >60 mL/min (ref >60)
GFR calc non Af Amer: >60 mL/min (ref >60)
Glucose, Bld: 145 mg/dL — ABNORMAL HIGH (ref 65–99)
Potassium: 3.6 mmol/L (ref 3.5–5.1)
Sodium: 140 mmol/L (ref 135–145)
Total Bilirubin: 3.1 mg/dL — ABNORMAL HIGH (ref 0.3–1.2)
Total Protein: 7.8 g/dL (ref 6.5–8.1)

## 2017-06-02 LAB — URINALYSIS, ROUTINE W REFLEX MICROSCOPIC
Bilirubin Urine: NEGATIVE
Glucose, UA: NEGATIVE mg/dL
Ketones, ur: 80 mg/dL — AB
Leukocytes, UA: NEGATIVE
Nitrite: NEGATIVE
Protein, ur: 30 mg/dL — AB
Specific Gravity, Urine: 1.023 (ref 1.005–1.030)
pH: 6 (ref 5.0–8.0)

## 2017-06-02 LAB — LACTIC ACID, PLASMA: Lactic Acid, Venous: 1.2 mmol/L (ref 0.5–1.9)

## 2017-06-02 LAB — PROTIME-INR
INR: 1.05
Prothrombin Time: 13.7 seconds (ref 11.4–15.2)

## 2017-06-02 LAB — CBC
HCT: 28.7 % — ABNORMAL LOW (ref 36.0–46.0)
Hemoglobin: 10.3 g/dL — ABNORMAL LOW (ref 12.0–15.0)
MCH: 32.6 pg (ref 26.0–34.0)
MCHC: 35.9 g/dL (ref 30.0–36.0)
MCV: 90.8 fL (ref 78.0–100.0)
Platelets: 155 10*3/uL (ref 150–400)
RBC: 3.16 MIL/uL — ABNORMAL LOW (ref 3.87–5.11)
RDW: 13.9 % (ref 11.5–15.5)
WBC: 10.2 10*3/uL (ref 4.0–10.5)

## 2017-06-02 LAB — LIPASE, BLOOD: Lipase: 17 U/L (ref 11–51)

## 2017-06-02 LAB — ACETAMINOPHEN LEVEL: Acetaminophen (Tylenol), Serum: <10 ug/mL — ABNORMAL LOW (ref 10–30)

## 2017-06-02 LAB — POC URINE PREG, ED: Preg Test, Ur: NEGATIVE

## 2017-06-02 LAB — HIV ANTIBODY (ROUTINE TESTING W REFLEX): HIV Screen 4th Generation wRfx: NONREACTIVE

## 2017-06-02 MED ORDER — SODIUM CHLORIDE 0.9 % IV BOLUS (SEPSIS)
1000.0000 mL | Freq: Once | INTRAVENOUS | Status: AC
  Administered 2017-06-02: 1000 mL via INTRAVENOUS

## 2017-06-02 MED ORDER — LORAZEPAM 2 MG/ML IJ SOLN: 1.0000 mg | Freq: Four times a day (QID) | INTRAMUSCULAR | Status: DC | PRN

## 2017-06-02 MED ORDER — THIAMINE HCL 100 MG/ML IJ SOLN
Freq: Once | INTRAVENOUS | Status: AC
  Administered 2017-06-02: 06:00:00 via INTRAVENOUS
  Filled 2017-06-02: qty 1000

## 2017-06-02 MED ORDER — SODIUM CHLORIDE 0.9 % IV SOLN
INTRAVENOUS | Status: DC
  Administered 2017-06-02: 05:00:00 via INTRAVENOUS

## 2017-06-02 MED ORDER — THIAMINE HCL 100 MG/ML IJ SOLN: 100.0000 mg | Freq: Every day | INTRAMUSCULAR | Status: DC

## 2017-06-02 MED ORDER — ONDANSETRON HCL 4 MG/2ML IJ SOLN: 4.0000 mg | Freq: Four times a day (QID) | INTRAMUSCULAR | Status: DC | PRN

## 2017-06-02 MED ORDER — LORAZEPAM 1 MG PO TABS
1.0000 mg | ORAL_TABLET | Freq: Four times a day (QID) | ORAL | Status: DC | PRN
  Administered 2017-06-02: 1 mg via ORAL
  Filled 2017-06-02: qty 1

## 2017-06-02 MED ORDER — ENOXAPARIN SODIUM 40 MG/0.4ML ~~LOC~~ SOLN
40.0000 mg | SUBCUTANEOUS | Status: DC
  Administered 2017-06-02 – 2017-06-03 (×2): 40 mg via SUBCUTANEOUS
  Filled 2017-06-02 (×3): qty 0.4

## 2017-06-02 MED ORDER — VITAMIN B-1 100 MG PO TABS
100.0000 mg | ORAL_TABLET | Freq: Every day | ORAL | Status: DC
  Administered 2017-06-02 – 2017-06-03 (×2): 100 mg via ORAL
  Filled 2017-06-02 (×2): qty 1

## 2017-06-02 MED ORDER — DEXTROSE-NACL 5-0.45 % IV SOLN
INTRAVENOUS | Status: DC
  Administered 2017-06-02 (×2): via INTRAVENOUS

## 2017-06-02 MED ORDER — FOLIC ACID 1 MG PO TABS
1.0000 mg | ORAL_TABLET | Freq: Every day | ORAL | Status: DC
  Administered 2017-06-02 – 2017-06-03 (×2): 1 mg via ORAL
  Filled 2017-06-02 (×2): qty 1

## 2017-06-02 MED ORDER — ADULT MULTIVITAMIN W/MINERALS CH
1.0000 | ORAL_TABLET | Freq: Every day | ORAL | Status: DC
  Administered 2017-06-02 – 2017-06-03 (×2): 1 via ORAL
  Filled 2017-06-02 (×2): qty 1

## 2017-06-02 MED ORDER — ACETAMINOPHEN 325 MG PO TABS
650.0000 mg | ORAL_TABLET | Freq: Four times a day (QID) | ORAL | Status: DC | PRN
  Administered 2017-06-02: 650 mg via ORAL
  Filled 2017-06-02: qty 2

## 2017-06-02 MED ORDER — SODIUM CHLORIDE 0.9 % IV SOLN: INTRAVENOUS | Status: DC

## 2017-06-02 NOTE — H&P (Signed)
History and Physical    Kelsey BarryVictoria K Nardone ZOX:[id number] DOB: 04/20/1971 DOA: 06/01/2017  PCP: Patient, No Pcp Per  Patient coming from: Home  I have personally briefly [facility]  Chief Complaint: N/V, EtOH  HPI: Kelsey [name] is a 46 y.o. female with medical history significant of EtOH abuse.  Has been drinking for ~30 years she says.  Patient developed nausea, vomiting, and unable to keep POs down including even EtOH.  Last drink was Friday.  Spent yesterday unable to tolerate POs.  Associated RUQ abd pain, mild.   ED Course: EtOH level undetectable in this patient with very extensive alcohol level and usually runs a BAL in the upper 300s-400s at least when ever she is in the ED.  Lactate of 4.x.  Anion gap of 26.  80 keytones in urine.   Review of Systems: As per HPI otherwise 10 point review of systems negative.   Past Medical History:  Diagnosis Date  . Acute ventilatory dependent respiratory failure 04/23/2015  . Alcohol abuse    History of 5 DWIs.  . Depression   . Intentional overdose of multiple pharmaceuticals 04/21/2015  . Normocytic anemia 04/23/2015  . 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 Suicide attempt [facility](HCC)     Past Surgical History:  Procedure Laterality Date  . COSMETIC SURGERY       reports that she has never smoked. She has never used smokeless tobacco. She reports that she drinks about 18.0 oz of alcohol per week . She reports that she does not use drugs.  No Known Allergies  Family History  Problem Relation Age of Onset  . Psychiatric Illness Neg Hx      Prior to Admission medications   Medication Sig Start Date End Date Taking? Authorizing Provider  aspirin EC 81 MG tablet Take 1 tablet (81 mg total) by mouth daily. For heart health Patient not taking: Reported on 09/08/2016 05/18/14   Armandina StammerNwoko, Agnes I, NP  ferrous sulfate 325 (65 FE) MG tablet Take 1 tablet (325 mg  total) by mouth daily with breakfast. For low iron Patient not taking: Reported on 09/08/2016 05/18/14   Armandina StammerNwoko, Agnes I, NP  folic acid (FOLVITE) 1 MG tablet Take 1 tablet (1 mg total) by mouth daily. Patient not taking: Reported on 09/08/2016 05/05/15   Alison Murrayevine, Alma M, MD  Multiple Vitamin (MULTIVITAMIN WITH MINERALS) TABS tablet Take 1 tablet by mouth daily. For low vitamin Patient not taking: Reported on 09/08/2016 05/18/14   Armandina StammerNwoko, Agnes I, NP    Physical Exam: Vitals:   06/02/17 0045 06/02/17 0100 06/02/17 0302 06/02/17 0330  BP:  113/79 113/77 115/79  Pulse: 87 84 97 82  Resp: 15 15 (!) 24 16  Temp:      TempSrc:      SpO2: 99% 98% 96% 96%  Weight:      Height:        Constitutional: NAD, calm, comfortable Eyes: PERRL, lids and conjunctivae normal ENMT: Mucous membranes are moist. Posterior pharynx clear of any exudate or lesions.Normal dentition.  Neck: normal, supple, no masses, no thyromegaly Respiratory: clear to auscultation bilaterally, no wheezing, no crackles. Normal respiratory effort. No accessory muscle use.  Cardiovascular: Regular rate and rhythm, no murmurs / rubs / gallops. No extremity edema. 2+ pedal pulses. No carotid bruits.  Abdomen: no tenderness, no masses palpated. No hepatosplenomegaly. Bowel sounds positive.  Musculoskeletal: no clubbing / cyanosis. No joint deformity upper and  lower extremities. Good ROM, no contractures. Normal muscle tone.  Skin: no rashes, lesions, ulcers. No induration Neurologic: CN 2-12 grossly intact. Sensation intact, DTR normal. Strength 5/5 in all 4.  Psychiatric: Normal judgment and insight. Alert and oriented x 3. Normal mood.    Labs on Admission: I have personally reviewed following labs and imaging studies  CBC:  Recent Labs Lab 06/01/17 2223  WBC 12.8*  HGB 12.8  HCT 37.0  MCV 92.3  PLT 175   Basic Metabolic Panel:  Recent Labs Lab 06/01/17 2252  NA 140  K 3.6  CL 96*  CO2 18*  GLUCOSE 145*  BUN 26*    CREATININE 0.96  CALCIUM 9.1   GFR: Estimated Creatinine Clearance: 72 mL/min (by C-G formula based on SCr of 0.96 mg/dL). Liver Function Tests:  Recent Labs Lab 06/01/17 2252  AST 240*  ALT 101*  ALKPHOS 37*  BILITOT 3.1*  PROT 7.8  ALBUMIN 4.7    Recent Labs Lab 06/01/17 2252  LIPASE 17   No results for input(s): AMMONIA in the last 168 hours. Coagulation Profile:  Recent Labs Lab 06/02/17 0208  INR 1.05   Cardiac Enzymes: No results for input(s): CKTOTAL, CKMB, CKMBINDEX, TROPONINI in the last 168 hours. BNP (last 3 results) No results for input(s): PROBNP in the last 8760 hours. HbA1C: No results for input(s): HGBA1C in the last 72 hours. CBG: No results for input(s): GLUCAP in the last 168 hours. Lipid Profile: No results for input(s): CHOL, HDL, LDLCALC, TRIG, CHOLHDL, LDLDIRECT in the last 72 hours. Thyroid Function Tests: No results for input(s): TSH, T4TOTAL, FREET4, T3FREE, THYROIDAB in the last 72 hours. Anemia Panel: No results for input(s): VITAMINB12, FOLATE, FERRITIN, TIBC, IRON, RETICCTPCT in the last 72 hours. Urine analysis:    Component Value Date/Time   COLORURINE YELLOW 06/02/2017 0208   APPEARANCEUR CLEAR 06/02/2017 0208   LABSPEC 1.023 06/02/2017 0208   PHURINE 6.0 06/02/2017 0208   GLUCOSEU NEGATIVE 06/02/2017 0208   HGBUR SMALL (A) 06/02/2017 0208   BILIRUBINUR NEGATIVE 06/02/2017 0208   KETONESUR 80 (A) 06/02/2017 0208   PROTEINUR 30 (A) 06/02/2017 0208   UROBILINOGEN 0.2 06/29/2015 1638   NITRITE NEGATIVE 06/02/2017 0208   LEUKOCYTESUR NEGATIVE 06/02/2017 0208    Radiological Exams on Admission: Ct Head Wo Contrast  Result Date: 06/02/2017 CLINICAL DATA:  Larey Seat down a flight of stairs, landing on face. Generalized neck pain and headache. EXAM: CT HEAD WITHOUT CONTRAST CT MAXILLOFACIAL WITHOUT CONTRAST CT CERVICAL SPINE WITHOUT CONTRAST TECHNIQUE: Multidetector CT imaging of the head, cervical spine, and maxillofacial  structures were performed using the standard protocol without intravenous contrast. Multiplanar CT image reconstructions of the cervical spine and maxillofacial structures were also generated. COMPARISON:  09/07/2016 FINDINGS: CT HEAD FINDINGS Brain: There is no intracranial hemorrhage, mass or evidence of acute infarction. There is no extra-axial fluid collection. Gray matter and white matter appear normal. Cerebral volume is normal for age. Brainstem and posterior fossa are unremarkable. The CSF spaces appear normal. Vascular: No hyperdense vessel or unexpected calcification. Skull: Normal. Negative for fracture or focal lesion. Other: None. CT MAXILLOFACIAL FINDINGS Osseous: No fracture or mandibular dislocation. No destructive process. Orbits: Negative. No traumatic or inflammatory finding. Sinuses: Clear. Soft tissues: Negative. CT CERVICAL SPINE FINDINGS Alignment: Normal. Skull base and vertebrae: No acute fracture. No primary bone lesion or focal pathologic process. Soft tissues and spinal canal: No prevertebral fluid or swelling. No visible canal hematoma. Disc levels: Mild cervical degenerative disc changes, greatest at  C4-5 and C5-6 where there are small posterior osteophytes. Facet articulations are well-preserved and intact. Upper chest: Negative. Other: None IMPRESSION: 1. Normal brain 2. Negative for acute maxillofacial fracture 3. Negative for acute cervical spine fracture Electronically Signed   By: Ellery Plunk M.D.   On: 06/02/2017 00:15   Ct Cervical Spine Wo Contrast  Result Date: 06/02/2017 CLINICAL DATA:  Larey Seat down a flight of stairs, landing on face. Generalized neck pain and headache. EXAM: CT HEAD WITHOUT CONTRAST CT MAXILLOFACIAL WITHOUT CONTRAST CT CERVICAL SPINE WITHOUT CONTRAST TECHNIQUE: Multidetector CT imaging of the head, cervical spine, and maxillofacial structures were performed using the standard protocol without intravenous contrast. Multiplanar CT image reconstructions  of the cervical spine and maxillofacial structures were also generated. COMPARISON:  09/07/2016 FINDINGS: CT HEAD FINDINGS Brain: There is no intracranial hemorrhage, mass or evidence of acute infarction. There is no extra-axial fluid collection. Gray matter and white matter appear normal. Cerebral volume is normal for age. Brainstem and posterior fossa are unremarkable. The CSF spaces appear normal. Vascular: No hyperdense vessel or unexpected calcification. Skull: Normal. Negative for fracture or focal lesion. Other: None. CT MAXILLOFACIAL FINDINGS Osseous: No fracture or mandibular dislocation. No destructive process. Orbits: Negative. No traumatic or inflammatory finding. Sinuses: Clear. Soft tissues: Negative. CT CERVICAL SPINE FINDINGS Alignment: Normal. Skull base and vertebrae: No acute fracture. No primary bone lesion or focal pathologic process. Soft tissues and spinal canal: No prevertebral fluid or swelling. No visible canal hematoma. Disc levels: Mild cervical degenerative disc changes, greatest at C4-5 and C5-6 where there are small posterior osteophytes. Facet articulations are well-preserved and intact. Upper chest: Negative. Other: None IMPRESSION: 1. Normal brain 2. Negative for acute maxillofacial fracture 3. Negative for acute cervical spine fracture Electronically Signed   By: Ellery Plunk M.D.   On: 06/02/2017 00:15   Ct Maxillofacial Wo Contrast  Result Date: 06/02/2017 CLINICAL DATA:  Larey Seat down a flight of stairs, landing on face. Generalized neck pain and headache. EXAM: CT HEAD WITHOUT CONTRAST CT MAXILLOFACIAL WITHOUT CONTRAST CT CERVICAL SPINE WITHOUT CONTRAST TECHNIQUE: Multidetector CT imaging of the head, cervical spine, and maxillofacial structures were performed using the standard protocol without intravenous contrast. Multiplanar CT image reconstructions of the cervical spine and maxillofacial structures were also generated. COMPARISON:  09/07/2016 FINDINGS: CT HEAD  FINDINGS Brain: There is no intracranial hemorrhage, mass or evidence of acute infarction. There is no extra-axial fluid collection. Gray matter and white matter appear normal. Cerebral volume is normal for age. Brainstem and posterior fossa are unremarkable. The CSF spaces appear normal. Vascular: No hyperdense vessel or unexpected calcification. Skull: Normal. Negative for fracture or focal lesion. Other: None. CT MAXILLOFACIAL FINDINGS Osseous: No fracture or mandibular dislocation. No destructive process. Orbits: Negative. No traumatic or inflammatory finding. Sinuses: Clear. Soft tissues: Negative. CT CERVICAL SPINE FINDINGS Alignment: Normal. Skull base and vertebrae: No acute fracture. No primary bone lesion or focal pathologic process. Soft tissues and spinal canal: No prevertebral fluid or swelling. No visible canal hematoma. Disc levels: Mild cervical degenerative disc changes, greatest at C4-5 and C5-6 where there are small posterior osteophytes. Facet articulations are well-preserved and intact. Upper chest: Negative. Other: None IMPRESSION: 1. Normal brain 2. Negative for acute maxillofacial fracture 3. Negative for acute cervical spine fracture Electronically Signed   By: Ellery Plunk M.D.   On: 06/02/2017 00:15    EKG: Independently reviewed.  Assessment/Plan Principal Problem:   Alcoholic hepatitis without ascites Active Problems:   Alcohol dependence (HCC)  Lactic acidosis    1. EtOH hepatitis - 1. Zofran PRN nausea 2. IVF 3. Advance diet as tolerated 4. Avoid EtOH 5. Maddrey's is 6.3.  Good prognosis and steroids not indicated at this time. 2. EtOH dependence - 1. CIWA 2. Anticipate withdrawals inpatient 3. Metabolic acidosis - mixed picture with lactic acidosis and EtOH Ketosis: 1. IVF: 1L NS bolus, then D5 half at 75 cc/hr and banana bag at 75 cc/hr 2. Repeat BMP at 0500  DVT prophylaxis: Lovenox Code Status: Full Family Communication: No family in  room Disposition Plan: home after admit Consults called: None Admission status: Admit to inpatient - likely will end up with EtOH withdrawals, need several days of inpatient treatment, not tolerating POs, etc.   GARDNER, JARED M. DO [facility] Pager [phone]  If 7AM-7PM, please contact day team taking care of patient [email or web address] Password [email or web address]  06/02/2017, 4:04 AM

## 2017-06-02 NOTE — ED Provider Notes (Signed)
[facility]-EMERGENCY DEPT Provider Note   CSN: [id number] Arrival date & time: 06/01/17  2213     History   Chief Complaint Chief Complaint  Patient presents with  . Fall  . Nausea    HPI Kelsey [name] is a 46 y.o. female with a hx of Alcoholism, AKI, presents to the Emergency Department complaining of gradual, persistent, progressively worsening lightheadedness, near syncope onset 24 hours ago.  Patient's mother reports that patient started drinking approximately one week ago and has been drinking what wine.  She reports the patient has not been eating this week.  Pt decided that last night she was going to stop drinking and has had no alcohol since that time. She reports severe lightheadedness and near syncope causing her to fall down a set of stairs this evening. She has associated laceration to her left upper lip and headache. Patient also complaining of mild, bilateral knee pain from the fall. She has been able to ambulate with assistance and can bear weight on her knees. Patient reports that sometimes the symptoms occur when she attempts to detox. She has no history of seizures. Nothing seems to make her symptoms better or worse at this time.   The history is provided by the patient and medical records. No language interpreter was used.    Past Medical History:  Diagnosis Date  . Acute ventilatory dependent respiratory failure 04/23/2015  . Alcohol abuse    History of 5 DWIs.  . Depression   . Intentional overdose of multiple pharmaceuticals 04/21/2015  . Normocytic anemia 04/23/2015  . 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 Suicide attempt [facility])     Patient Active Problem List   Diagnosis Date Noted  . AKI (acute kidney injury) (HCC)   . Hypernatremia   . Anemia of chronic disease   . Acute alcohol intoxication (HCC) 05/04/2015  . Alcohol use disorder, severe, dependence (HCC)   . Major depressive disorder, recurrent episode, moderate (HCC)   .  Acute encephalopathy 04/23/2015  . Metabolic acidosis 04/23/2015  . Hyperglycemia 04/23/2015  . Hypokalemia 04/23/2015  . Normocytic anemia 04/23/2015  . Lactic acidosis 04/23/2015  . Acute ventilatory dependent respiratory failure 04/23/2015  . Hypotension with circulatory shock 04/23/2015  . Hyperchloremia 04/23/2015  . Polysubstance abuse 04/22/2015  . 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 Intentional overdose of multiple pharmaceuticals 04/21/2015  . GAD (generalized anxiety disorder) 05/18/2014  . Alcohol dependence (HCC) 05/15/2014    Past Surgical History:  Procedure Laterality Date  . COSMETIC SURGERY      OB History    No data available       Home Medications    Prior to Admission medications   Medication Sig Start Date End Date Taking? Authorizing Provider  aspirin EC 81 MG tablet Take 1 tablet (81 mg total) by mouth daily. For heart health Patient not taking: Reported on 09/08/2016 05/18/14   Armandina Stammer I, NP  ferrous sulfate 325 (65 FE) MG tablet Take 1 tablet (325 mg total) by mouth daily with breakfast. For low iron Patient not taking: Reported on 09/08/2016 05/18/14   Armandina Stammer I, NP  folic acid (FOLVITE) 1 MG tablet Take 1 tablet (1 mg total) by mouth daily. Patient not taking: Reported on 09/08/2016 05/05/15   Alison Murray, MD  Multiple Vitamin (MULTIVITAMIN WITH MINERALS) TABS tablet Take 1 tablet by mouth daily. For low vitamin Patient not taking: Reported on 09/08/2016 05/18/14   Sanjuana Kava, NP  Family History History reviewed. No pertinent family history.  Social History Social History  Substance Use Topics  . Smoking status: Never Smoker  . Smokeless tobacco: Never Used  . Alcohol use 18.0 oz/week    30 Glasses of wine per week     Allergies   Patient has no known allergies.   Review of Systems Review of Systems  Constitutional: Negative for appetite change, diaphoresis, fatigue, fever and unexpected  weight change.  HENT: Positive for facial swelling. Negative for mouth sores.   Eyes: Negative for visual disturbance.  Respiratory: Negative for cough, chest tightness, shortness of breath and wheezing.   Cardiovascular: Negative for chest pain.  Gastrointestinal: Positive for abdominal pain, nausea and vomiting. Negative for constipation and diarrhea.  Endocrine: Negative for polydipsia, polyphagia and polyuria.  Genitourinary: Negative for dysuria, frequency, hematuria and urgency.  Musculoskeletal: Positive for arthralgias ( bilateral knees). Negative for back pain and neck stiffness.  Skin: Positive for wound. Negative for rash.  Allergic/Immunologic: Negative for immunocompromised state.  Neurological: Positive for light-headedness. Negative for syncope and headaches.  Hematological: Does not bruise/bleed easily.  Psychiatric/Behavioral: Negative for sleep disturbance. The patient is not nervous/anxious.   All other systems reviewed and are negative.    Physical Exam Updated Vital Signs BP 105/71   Pulse 83   Temp 97.8 F (36.6 C) (Oral)   Resp 19   Ht 5\' 7"  (1.702 m)   Wt 65.8 kg (145 lb)   LMP 05/18/2017   SpO2 100%   BMI 22.71 kg/m   Physical Exam  Constitutional: She appears well-developed and well-nourished. No distress.  Awake, alert, nontoxic appearance  HENT:  Head: Normocephalic and atraumatic.  Mouth/Throat: Oropharynx is clear and moist. No oropharyngeal exudate.  Superficial laceration to the mucosa of the left upper lip  Eyes: Conjunctivae are normal. No scleral icterus.  Neck: Normal range of motion. Neck supple.  FROM without pain No midline or paraspinal tenderness  Cardiovascular: Normal rate, regular rhythm and intact distal pulses.   Pulmonary/Chest: Effort normal and breath sounds normal. No respiratory distress. She has no wheezes.  Equal chest expansion  Abdominal: Soft. Bowel sounds are normal. She exhibits no mass. There is generalized  tenderness and tenderness in the right upper quadrant and epigastric area. There is no rebound and no guarding.  Musculoskeletal: Normal range of motion. She exhibits no edema.  Full range of motion of bilateral knees without joint line tenderness. Bruises noted to the bilateral patellas. No abnormal patellar movement. Patellar tendon is intact bilaterally. Full range of motion of the T-spine and L-spine. No midline or paraspinal tenderness.  Neurological: She is alert.  Speech is clear and goal oriented Moves extremities without ataxia  Skin: Skin is warm and dry. She is not diaphoretic.  Psychiatric: She has a normal mood and affect.  Nursing note and vitals reviewed.    ED Treatments / Results  Labs (all labs ordered are listed, but only abnormal results are displayed) Labs Reviewed  CBC - Abnormal; Notable for the following:       Result Value   WBC 12.8 (*)    All other components within normal limits  COMPREHENSIVE METABOLIC PANEL - Abnormal; Notable for the following:    Chloride 96 (*)    CO2 18 (*)    Glucose, Bld 145 (*)    BUN 26 (*)    AST 240 (*)    ALT 101 (*)    Alkaline Phosphatase 37 (*)    Total  Bilirubin 3.1 (*)    Anion gap 26 (*)    All other components within normal limits  ETHANOL  LIPASE, BLOOD  URINALYSIS, ROUTINE W REFLEX MICROSCOPIC  RAPID URINE DRUG SCREEN, HOSP PERFORMED  ACETAMINOPHEN LEVEL  PROTIME-INR  POC URINE PREG, ED  I-STAT CG4 LACTIC ACID, ED    Radiology Ct Head Wo Contrast  Result Date: 06/02/2017 CLINICAL DATA:  Larey Seat down a flight of stairs, landing on face. Generalized neck pain and headache. EXAM: CT HEAD WITHOUT CONTRAST CT MAXILLOFACIAL WITHOUT CONTRAST CT CERVICAL SPINE WITHOUT CONTRAST TECHNIQUE: Multidetector CT imaging of the head, cervical spine, and maxillofacial structures were performed using the standard protocol without intravenous contrast. Multiplanar CT image reconstructions of the cervical spine and maxillofacial  structures were also generated. COMPARISON:  09/07/2016 FINDINGS: CT HEAD FINDINGS Brain: There is no intracranial hemorrhage, mass or evidence of acute infarction. There is no extra-axial fluid collection. Gray matter and white matter appear normal. Cerebral volume is normal for age. Brainstem and posterior fossa are unremarkable. The CSF spaces appear normal. Vascular: No hyperdense vessel or unexpected calcification. Skull: Normal. Negative for fracture or focal lesion. Other: None. CT MAXILLOFACIAL FINDINGS Osseous: No fracture or mandibular dislocation. No destructive process. Orbits: Negative. No traumatic or inflammatory finding. Sinuses: Clear. Soft tissues: Negative. CT CERVICAL SPINE FINDINGS Alignment: Normal. Skull base and vertebrae: No acute fracture. No primary bone lesion or focal pathologic process. Soft tissues and spinal canal: No prevertebral fluid or swelling. No visible canal hematoma. Disc levels: Mild cervical degenerative disc changes, greatest at C4-5 and C5-6 where there are small posterior osteophytes. Facet articulations are well-preserved and intact. Upper chest: Negative. Other: None IMPRESSION: 1. Normal brain 2. Negative for acute maxillofacial fracture 3. Negative for acute cervical spine fracture Electronically Signed   By: Ellery Plunk M.D.   On: 06/02/2017 00:15   Ct Cervical Spine Wo Contrast  Result Date: 06/02/2017 CLINICAL DATA:  Larey Seat down a flight of stairs, landing on face. Generalized neck pain and headache. EXAM: CT HEAD WITHOUT CONTRAST CT MAXILLOFACIAL WITHOUT CONTRAST CT CERVICAL SPINE WITHOUT CONTRAST TECHNIQUE: Multidetector CT imaging of the head, cervical spine, and maxillofacial structures were performed using the standard protocol without intravenous contrast. Multiplanar CT image reconstructions of the cervical spine and maxillofacial structures were also generated. COMPARISON:  09/07/2016 FINDINGS: CT HEAD FINDINGS Brain: There is no intracranial  hemorrhage, mass or evidence of acute infarction. There is no extra-axial fluid collection. Gray matter and white matter appear normal. Cerebral volume is normal for age. Brainstem and posterior fossa are unremarkable. The CSF spaces appear normal. Vascular: No hyperdense vessel or unexpected calcification. Skull: Normal. Negative for fracture or focal lesion. Other: None. CT MAXILLOFACIAL FINDINGS Osseous: No fracture or mandibular dislocation. No destructive process. Orbits: Negative. No traumatic or inflammatory finding. Sinuses: Clear. Soft tissues: Negative. CT CERVICAL SPINE FINDINGS Alignment: Normal. Skull base and vertebrae: No acute fracture. No primary bone lesion or focal pathologic process. Soft tissues and spinal canal: No prevertebral fluid or swelling. No visible canal hematoma. Disc levels: Mild cervical degenerative disc changes, greatest at C4-5 and C5-6 where there are small posterior osteophytes. Facet articulations are well-preserved and intact. Upper chest: Negative. Other: None IMPRESSION: 1. Normal brain 2. Negative for acute maxillofacial fracture 3. Negative for acute cervical spine fracture Electronically Signed   By: Ellery Plunk M.D.   On: 06/02/2017 00:15   Ct Maxillofacial Wo Contrast  Result Date: 06/02/2017 CLINICAL DATA:  Larey Seat down a flight of stairs, landing  on face. Generalized neck pain and headache. EXAM: CT HEAD WITHOUT CONTRAST CT MAXILLOFACIAL WITHOUT CONTRAST CT CERVICAL SPINE WITHOUT CONTRAST TECHNIQUE: Multidetector CT imaging of the head, cervical spine, and maxillofacial structures were performed using the standard protocol without intravenous contrast. Multiplanar CT image reconstructions of the cervical spine and maxillofacial structures were also generated. COMPARISON:  09/07/2016 FINDINGS: CT HEAD FINDINGS Brain: There is no intracranial hemorrhage, mass or evidence of acute infarction. There is no extra-axial fluid collection. Gray matter and white matter  appear normal. Cerebral volume is normal for age. Brainstem and posterior fossa are unremarkable. The CSF spaces appear normal. Vascular: No hyperdense vessel or unexpected calcification. Skull: Normal. Negative for fracture or focal lesion. Other: None. CT MAXILLOFACIAL FINDINGS Osseous: No fracture or mandibular dislocation. No destructive process. Orbits: Negative. No traumatic or inflammatory finding. Sinuses: Clear. Soft tissues: Negative. CT CERVICAL SPINE FINDINGS Alignment: Normal. Skull base and vertebrae: No acute fracture. No primary bone lesion or focal pathologic process. Soft tissues and spinal canal: No prevertebral fluid or swelling. No visible canal hematoma. Disc levels: Mild cervical degenerative disc changes, greatest at C4-5 and C5-6 where there are small posterior osteophytes. Facet articulations are well-preserved and intact. Upper chest: Negative. Other: None IMPRESSION: 1. Normal brain 2. Negative for acute maxillofacial fracture 3. Negative for acute cervical spine fracture Electronically Signed   By: Ellery Plunkaniel R Mitchell M.D.   On: 06/02/2017 00:15    Procedures Procedures (including critical care time)  Medications Ordered in ED Medications  ondansetron (ZOFRAN) injection 4 mg (not administered)     Initial Impression / Assessment and Plan / ED Course  I have reviewed the triage vital signs and the nursing notes.  Pertinent labs & imaging results that were available during my care of the patient were reviewed by me and considered in my medical decision making (see chart for details).     Patient presents with near-syncopal episode and fall tonight. CT head and neck without acute trauma. Lip laceration is superficial and does not need sutures. No broken or loose teeth.  Labs show likely alcoholic ketosis and alcoholic hepatitis. UA, lactic acid and ultrasound pending. Patient will need admission for further hydration and repeat labs in the morning.  Discussed with Dr.  Julian ReilGardner who will admit.    Final Clinical Impressions(s) / ED Diagnoses   Final diagnoses:  Alcoholic hepatitis without ascites  Metabolic acidosis  Dehydration  Fall, initial encounter  Lip laceration, initial encounter    New Prescriptions New Prescriptions   No medications on file     Milta DeitersMuthersbaugh, Hannah, PA-C 06/02/17 0145    Dione BoozeGlick, David, MD 06/02/17 [phone]

## 2017-06-02 NOTE — ED Notes (Addendum)
Notified PA,Hannah, pt. I-stat CG4 Lactic acid results 4.27 and RN,Kari made aware.

## 2017-06-02 NOTE — Progress Notes (Signed)
[facility]  Patient admitted after midnight see H&P for further detail  Patient is a 46 year old female with no significant medical history other than alcohol abuse. She has been drinking on and off for 30 years. Presented to the emergency department complaining of nausea and vomiting unable to keep anything PO. She was found to have elevated LFTs and was admitted for alcoholic hepatitis and monitor for alcohol withdrawal.   Patient seen, examined, chart has been reviewed.  Vital signs stable  Impression Acute alcoholic hepatitis Alcohol dependence - no signs of alcohol withdrawal at this moment patient off alcohol for the past 72 hrs Metabolic acidosis in view of alcohol abuse - resolved  Plan: Continue IV fluid and banana bag CIWA protocol  Check CMP in a.m. if stable likely to be discharged in the morning.  Latrelle DodrillEdwin Silva, MD

## 2017-06-02 NOTE — ED Notes (Signed)
US tech at bedside

## 2017-06-02 NOTE — Progress Notes (Signed)
Pt arrived to unit room 1507 via stretcher. Ambulated to bed, unsteady gait. Alert and oriented x 4. VS taken. Pt oriented to room and callbell with no complications. Initial assessment completed, will continue to monitor.

## 2017-06-03 DIAGNOSIS — E872 Acidosis: Secondary | ICD-10-CM

## 2017-06-03 DIAGNOSIS — F1020 Alcohol dependence, uncomplicated: Secondary | ICD-10-CM

## 2017-06-03 LAB — COMPREHENSIVE METABOLIC PANEL
ALT: 66 U/L — ABNORMAL HIGH (ref 14–54)
AST: 130 U/L — ABNORMAL HIGH (ref 15–41)
Albumin: 3.6 g/dL (ref 3.5–5.0)
Alkaline Phosphatase: 28 U/L — ABNORMAL LOW (ref 38–126)
Anion gap: 5 (ref 5–15)
BUN: 6 mg/dL (ref 6–20)
CO2: 26 mmol/L (ref 22–32)
Calcium: 8.1 mg/dL — ABNORMAL LOW (ref 8.9–10.3)
Chloride: 108 mmol/L (ref 101–111)
Creatinine, Ser: 0.66 mg/dL (ref 0.44–1.00)
GFR calc Af Amer: >60 mL/min (ref >60)
GFR calc non Af Amer: >60 mL/min (ref >60)
Glucose, Bld: 113 mg/dL — ABNORMAL HIGH (ref 65–99)
Potassium: 3.4 mmol/L — ABNORMAL LOW (ref 3.5–5.1)
Sodium: 139 mmol/L (ref 135–145)
Total Bilirubin: 2 mg/dL — ABNORMAL HIGH (ref 0.3–1.2)
Total Protein: 5.9 g/dL — ABNORMAL LOW (ref 6.5–8.1)

## 2017-06-03 MED ORDER — FOLIC ACID 1 MG PO TABS: 1.0000 mg | ORAL_TABLET | Freq: Every day | ORAL | 0 refills | Status: AC

## 2017-06-03 MED ORDER — ADULT MULTIVITAMIN W/MINERALS CH: 1.0000 | ORAL_TABLET | Freq: Every day | ORAL | 0 refills | Status: AC

## 2017-06-03 NOTE — Care Management Note (Signed)
Case Management Note  Patient Details  Name: Kelsey [name] MRN: [id number] Date of Birth: 04/24/1971  Subjective/Objective:       ETOH W/D             Action/Plan: Date:  June 03 2017  Chart reviewed for concurrent status and case management needs.  Will continue to follow patient progress.  Discharge Planning: following for needs  Expected discharge date: [id number]  Marcelle SmilingRhonda [name], BSN, [place] BeachRN3, [place]CCM   [phone]   Expected Discharge Date:                  Expected Discharge Plan:  Home/Self Care  In-House Referral:     Discharge planning Services  CM Consult  Post Acute Care Choice:    Choice offered to:     DME Arranged:    DME Agency:     HH Arranged:    HH Agency:     Status of Service:  In process, will continue to follow  If discussed at Long Length of Stay Meetings, dates discussed:    Additional Comments:  Golda AcreDavis, Kelsey Lynn, RN 06/03/2017, 9:16 AM

## 2017-06-03 NOTE — Progress Notes (Signed)
Went over AVS with patient.  She verbalized understanding.  Left hospital with AVS, note, and script.

## 2017-06-03 NOTE — Discharge Summary (Signed)
Physician Discharge Summary  Kelsey [name]  AVW:[id number]  DOB: May 31, 1971  DOA: 06/01/2017 PCP: Patient, No Pcp Per  Admit date: 06/01/2017 Discharge date: 06/03/2017  Admitted From: Home  Disposition:  Home   Recommendations for Outpatient Follow-up:  1. Follow up with PCP in 1 weeks 2. Please obtain CMP/CBC in one week to monitor Hgb and Liver function  Discharge Condition:Stable CODE STATUS: Full code  Diet recommendation: Regular  Brief/Interim Summary: Kelsey [name] is a 46 year old female with no significant medical history other than alcohol abuse.She has been drinking on and off for 30 years. Presented to the emergency department complaining of nausea and vomiting unable to keep anything PO. She was found to have elevated LFTs and was admitted for alcoholic hepatitis and monitor for alcohol withdrawal.   Subjective: Patient seen and examined on the day of discharge she report having mild headache, no nausea or vomiting. Tolerating diet well. Remains afebrile. Denies any tremors. LFTs trending down. No acute events overnight.  Discharge Diagnoses/Hospital Course:  Alcoholic hepatitis LFTs were monitored and now trending down. Abdominal ultrasound shows signs of fatty liver Recommended to repeat liver function test in one week. Encourage alcohol cessation  Alcohol dependence Patient was monitored for our call withdrawal symptoms CIWA was 0 and no medications were required. She was advice on alcohol abstinence Social worker was consulted and given guidance on outside resources  Metabolic acidosis secondary to alcohol abuse Resolve Treated with IV fluids  On the day of the discharge the patient's vitals were stable, and no other acute medical condition were reported by patient. Patient was felt safe to be discharge to home.   Discharge Instructions  You were cared for by a hospitalist during your hospital stay. If you have any questions about your discharge  medications or the care you received while you were in the hospital after you are discharged, you can call the unit and asked to speak with the hospitalist on call if the hospitalist that took care of you is not available. Once you are discharged, your primary care physician will handle any further medical issues. Please note that NO REFILLS for any discharge medications will be authorized once you are discharged, as it is imperative that you return to your primary care physician (or establish a relationship with a primary care physician if you do not have one) for your aftercare needs so that they can reassess your need for medications and monitor your lab values.  Discharge Instructions    Call MD for:  difficulty breathing, headache or visual disturbances    Complete by:  As directed    Call MD for:  extreme fatigue    Complete by:  As directed    Call MD for:  hives    Complete by:  As directed    Call MD for:  persistant dizziness or light-headedness    Complete by:  As directed    Call MD for:  persistant nausea and vomiting    Complete by:  As directed    Call MD for:  redness, tenderness, or signs of infection (pain, swelling, redness, odor or green/yellow discharge around incision site)    Complete by:  As directed    Call MD for:  severe uncontrolled pain    Complete by:  As directed    Call MD for:  temperature >100.4    Complete by:  As directed    Diet - low sodium heart healthy    Complete by:  As  directed    Increase activity slowly    Complete by:  As directed      Allergies as of 06/03/2017   No Known Allergies     Medication List    TAKE these medications   aspirin EC 81 MG tablet Take 1 tablet (81 mg total) by mouth daily. For heart health   ferrous sulfate 325 (65 FE) MG tablet Take 1 tablet (325 mg total) by mouth daily with breakfast. For low iron   folic acid 1 MG tablet Commonly known as:  FOLVITE Take 1 tablet (1 mg total) by mouth daily.    multivitamin with minerals Tabs tablet Take 1 tablet by mouth daily. For low vitamin      Follow-up Information    PCP. Schedule an appointment as soon as possible for a visit in 1 week(s).   Why:  Hospital follow up          No Known Allergies  Consultations:  None    Procedures/Studies: Ct Head Wo Contrast  Result Date: 06/02/2017 CLINICAL DATA:  Larey SeatFell down a flight of stairs, landing on face. Generalized neck pain and headache. EXAM: CT HEAD WITHOUT CONTRAST CT MAXILLOFACIAL WITHOUT CONTRAST CT CERVICAL SPINE WITHOUT CONTRAST TECHNIQUE: Multidetector CT imaging of the head, cervical spine, and maxillofacial structures were performed using the standard protocol without intravenous contrast. Multiplanar CT image reconstructions of the cervical spine and maxillofacial structures were also generated. COMPARISON:  09/07/2016 FINDINGS: CT HEAD FINDINGS Brain: There is no intracranial hemorrhage, mass or evidence of acute infarction. There is no extra-axial fluid collection. Gray matter and white matter appear normal. Cerebral volume is normal for age. Brainstem and posterior fossa are unremarkable. The CSF spaces appear normal. Vascular: No hyperdense vessel or unexpected calcification. Skull: Normal. Negative for fracture or focal lesion. Other: None. CT MAXILLOFACIAL FINDINGS Osseous: No fracture or mandibular dislocation. No destructive process. Orbits: Negative. No traumatic or inflammatory finding. Sinuses: Clear. Soft tissues: Negative. CT CERVICAL SPINE FINDINGS Alignment: Normal. Skull base and vertebrae: No acute fracture. No primary bone lesion or focal pathologic process. Soft tissues and spinal canal: No prevertebral fluid or swelling. No visible canal hematoma. Disc levels: Mild cervical degenerative disc changes, greatest at C4-5 and C5-6 where there are small posterior osteophytes. Facet articulations are well-preserved and intact. Upper chest: Negative. Other: None IMPRESSION: 1.  Normal brain 2. Negative for acute maxillofacial fracture 3. Negative for acute cervical spine fracture Electronically Signed   By: Ellery Plunkaniel R Mitchell M.D.   On: 06/02/2017 00:15   Ct Cervical Spine Wo Contrast  Result Date: 06/02/2017 CLINICAL DATA:  Larey SeatFell down a flight of stairs, landing on face. Generalized neck pain and headache. EXAM: CT HEAD WITHOUT CONTRAST CT MAXILLOFACIAL WITHOUT CONTRAST CT CERVICAL SPINE WITHOUT CONTRAST TECHNIQUE: Multidetector CT imaging of the head, cervical spine, and maxillofacial structures were performed using the standard protocol without intravenous contrast. Multiplanar CT image reconstructions of the cervical spine and maxillofacial structures were also generated. COMPARISON:  09/07/2016 FINDINGS: CT HEAD FINDINGS Brain: There is no intracranial hemorrhage, mass or evidence of acute infarction. There is no extra-axial fluid collection. Gray matter and white matter appear normal. Cerebral volume is normal for age. Brainstem and posterior fossa are unremarkable. The CSF spaces appear normal. Vascular: No hyperdense vessel or unexpected calcification. Skull: Normal. Negative for fracture or focal lesion. Other: None. CT MAXILLOFACIAL FINDINGS Osseous: No fracture or mandibular dislocation. No destructive process. Orbits: Negative. No traumatic or inflammatory finding. Sinuses: Clear. Soft tissues: Negative. CT  CERVICAL SPINE FINDINGS Alignment: Normal. Skull base and vertebrae: No acute fracture. No primary bone lesion or focal pathologic process. Soft tissues and spinal canal: No prevertebral fluid or swelling. No visible canal hematoma. Disc levels: Mild cervical degenerative disc changes, greatest at C4-5 and C5-6 where there are small posterior osteophytes. Facet articulations are well-preserved and intact. Upper chest: Negative. Other: None IMPRESSION: 1. Normal brain 2. Negative for acute maxillofacial fracture 3. Negative for acute cervical spine fracture Electronically  Signed   By: Ellery Plunkaniel R Mitchell M.D.   On: 06/02/2017 00:15   Ct Maxillofacial Wo Contrast  Result Date: 06/02/2017 CLINICAL DATA:  Larey SeatFell down a flight of stairs, landing on face. Generalized neck pain and headache. EXAM: CT HEAD WITHOUT CONTRAST CT MAXILLOFACIAL WITHOUT CONTRAST CT CERVICAL SPINE WITHOUT CONTRAST TECHNIQUE: Multidetector CT imaging of the head, cervical spine, and maxillofacial structures were performed using the standard protocol without intravenous contrast. Multiplanar CT image reconstructions of the cervical spine and maxillofacial structures were also generated. COMPARISON:  09/07/2016 FINDINGS: CT HEAD FINDINGS Brain: There is no intracranial hemorrhage, mass or evidence of acute infarction. There is no extra-axial fluid collection. Gray matter and white matter appear normal. Cerebral volume is normal for age. Brainstem and posterior fossa are unremarkable. The CSF spaces appear normal. Vascular: No hyperdense vessel or unexpected calcification. Skull: Normal. Negative for fracture or focal lesion. Other: None. CT MAXILLOFACIAL FINDINGS Osseous: No fracture or mandibular dislocation. No destructive process. Orbits: Negative. No traumatic or inflammatory finding. Sinuses: Clear. Soft tissues: Negative. CT CERVICAL SPINE FINDINGS Alignment: Normal. Skull base and vertebrae: No acute fracture. No primary bone lesion or focal pathologic process. Soft tissues and spinal canal: No prevertebral fluid or swelling. No visible canal hematoma. Disc levels: Mild cervical degenerative disc changes, greatest at C4-5 and C5-6 where there are small posterior osteophytes. Facet articulations are well-preserved and intact. Upper chest: Negative. Other: None IMPRESSION: 1. Normal brain 2. Negative for acute maxillofacial fracture 3. Negative for acute cervical spine fracture Electronically Signed   By: Ellery Plunkaniel R Mitchell M.D.   On: 06/02/2017 00:15   [place] Abdomen Limited Ruq  Result Date: 06/02/2017 CLINICAL  DATA:  Abdominal pain and nausea. EXAM: ULTRASOUND ABDOMEN LIMITED RIGHT UPPER QUADRANT COMPARISON:  None. FINDINGS: Gallbladder: No gallstones or wall thickening visualized. No sonographic Murphy sign noted by sonographer. Common bile duct: Diameter: 3.1 mm Liver: Mildly coarsened hepatic parenchymal echotexture without focal lesion. IMPRESSION: Normal gallbladder and bile ducts. Mildly coarsened hepatic parenchymal echotexture without focal lesion. This could represent fatty infiltration. Electronically Signed   By: Ellery Plunkaniel R Mitchell M.D.   On: 06/02/2017 04:56     Discharge Exam: Vitals:   06/02/17 2239 06/03/17 0526  BP: 115/89 112/88  Pulse: 89 93  Resp: 18 16  Temp: 99.2 F (37.3 C) 98.3 F (36.8 C)   Vitals:   06/02/17 1400 06/02/17 2024 06/02/17 2239 06/03/17 0526  BP: 117/85 110/75 115/89 112/88  Pulse: 87 92 89 93  Resp: 18 18 18 16   Temp: 98.8 F (37.1 C) 98.8 F (37.1 C) 99.2 F (37.3 C) 98.3 F (36.8 C)  TempSrc: Oral Oral Oral Oral  SpO2: 100% 99% 100% 97%  Weight:      Height:        General: Pt is alert, awake, not in acute distress Cardiovascular: RRR, S1/S2 +, no rubs, no gallops Respiratory: CTA bilaterally, no wheezing, no rhonchi Abdominal: Soft, NT, ND, bowel sounds + Neuro: AAOx3, no tremors or hallucinations  Extremities: no edema  The results of significant diagnostics from this hospitalization (including imaging, microbiology, ancillary and laboratory) are listed below for reference.     Microbiology: No results found for this or any previous visit (from the past 240 hour(s)).   Labs: BNP (last 3 results) No results for input(s): BNP in the last 8760 hours. Basic Metabolic Panel:  Recent Labs Lab 06/01/17 2252 06/02/17 0555 06/03/17 0836  NA 140 140 139  K 3.6 4.0 3.4*  CL 96* 103 108  CO2 18* 24 26  GLUCOSE 145* 141* 113*  BUN 26* 20 6  CREATININE 0.96 0.75 0.66  CALCIUM 9.1 7.9* 8.1*   Liver Function Tests:  Recent  Labs Lab 06/01/17 2252 06/03/17 0836  AST 240* 130*  ALT 101* 66*  ALKPHOS 37* 28*  BILITOT 3.1* 2.0*  PROT 7.8 5.9*  ALBUMIN 4.7 3.6    Recent Labs Lab 06/01/17 2252  LIPASE 17   No results for input(s): AMMONIA in the last 168 hours. CBC:  Recent Labs Lab 06/01/17 2223 06/02/17 0555  WBC 12.8* 10.2  HGB 12.8 10.3*  HCT 37.0 28.7*  MCV 92.3 90.8  PLT 175 155   Cardiac Enzymes: No results for input(s): CKTOTAL, CKMB, CKMBINDEX, TROPONINI in the last 168 hours. BNP: Invalid input(s): POCBNP CBG: No results for input(s): GLUCAP in the last 168 hours. D-Dimer No results for input(s): DDIMER in the last 72 hours. Hgb A1c No results for input(s): HGBA1C in the last 72 hours. Lipid Profile No results for input(s): CHOL, HDL, LDLCALC, TRIG, CHOLHDL, LDLDIRECT in the last 72 hours. Thyroid function studies No results for input(s): TSH, T4TOTAL, T3FREE, THYROIDAB in the last 72 hours.  Invalid input(s): FREET3 Anemia work up No results for input(s): VITAMINB12, FOLATE, FERRITIN, TIBC, IRON, RETICCTPCT in the last 72 hours. Urinalysis    Component Value Date/Time   COLORURINE YELLOW 06/02/2017 0208   APPEARANCEUR CLEAR 06/02/2017 0208   LABSPEC 1.023 06/02/2017 0208   PHURINE 6.0 06/02/2017 0208   GLUCOSEU NEGATIVE 06/02/2017 0208   HGBUR SMALL (A) 06/02/2017 0208   BILIRUBINUR NEGATIVE 06/02/2017 0208   KETONESUR 80 (A) 06/02/2017 0208   PROTEINUR 30 (A) 06/02/2017 0208   UROBILINOGEN 0.2 06/29/2015 1638   NITRITE NEGATIVE 06/02/2017 0208   LEUKOCYTESUR NEGATIVE 06/02/2017 0208   Sepsis Labs Invalid input(s): PROCALCITONIN,  WBC,  LACTICIDVEN Microbiology No results found for this or any previous visit (from the past 240 hour(s)).   Time coordinating discharge: 25 minutes  SIGNED:  Latrelle Dodrill, MD  [facility] 06/03/2017, 10:51 AM  Pager please text page via  [email or web address] Password [email or web address]

## 2017-06-03 NOTE — Clinical Social Work Note (Signed)
Clinical Social Work Assessment  Patient Details  Name: Kelsey [name] MRN: [id number] Date of Birth: Mar 17, 1971  Date of referral:  06/03/17               Reason for consult:                   Permission sought to share information with:    Permission granted to share information::     Name::        Agency::     Relationship::     Contact Information:     Housing/Transportation Living arrangements for the past 2 months:  Single Family Home Source of Information:  Patient Patient Interpreter Needed:  None Criminal Activity/Legal Involvement Pertinent to Current Situation/Hospitalization:  No - Comment as needed Significant Relationships:  Adult Children, Parents Lives with:  Parents Do you feel safe going back to the place where you live?  Yes Need for family participation in patient care:  No (Coment)  Care giving concerns: ETOH nausea, vomiting. Resources for SA treatment.    Social Worker assessment / plan:   CSW met with patient at bedside, explain role and reason for intervention. Patient alert, oriented and agreeable to talk with CSW. Patient reports she came to the hospital after developing nausea and vomiting. Patient reports she came to hospital to be checked. Patient reports a thirty year history of alcohol use. She reports drinking alcohol since she was 46 years old. Patient has history of five DWI's.Patient reports times of sobriety and several inpatient, outpatient and IOP programs. Patient reports she recently completed 30 days of residential Treatment at [facility] facility. She has since continued intensive outpatient group at [facility]. She reports, "it is helpful but sometime I think the group members focus more on there personal lives than topics about trying to stay sober."  Patient plans to continue the IOP group.  Plan: Patient will continue intensive outpatient services at [facility] [place].   Employment status:   Psychologist, counselling:  Medicaid In [place] PT Recommendations:  Not assessed at this time Information / Referral to community resources:  [place]  Patient/Family's Response to care: Agreeable to talk with CSW about treatment. Patient already well inform of resources but appreciative of csw services.   Patient/Family's Understanding of and Emotional Response to Diagnosis, Current Treatment, and Prognosis:  "I am familiar with all of the rehab, outpatient inpatient places in [place]. I have traveled all over to residential places to get treatment. " Patient expresses a history of treatment and relapse. Patient reports is currently going to IOP group a few times a week for support. Patient understands that her drinking is affecting her health and continues to try to stay sober.  -No family at bedside, patient reports, "I live with my mother and she makes me go to treatment."  Emotional Assessment Appearance:  Developmentally appropriate, Appears stated age Attitude/Demeanor/Rapport:    Affect (typically observed):  Accepting, Pleasant Orientation:  Oriented to Self, Oriented to Place, Oriented to  Time, Oriented to Situation Alcohol / Substance use:  Alcohol Use Psych involvement (Current and /or in the community):  No (Comment)  Discharge Needs  Concerns to be addressed:  Financial / Insurance Concerns, Substance Abuse Concerns Readmission within the last 30 days:  No Current discharge risk:  Substance Abuse Barriers to Discharge:  No Barriers Identified   Lia Hopping, LCSW 06/03/2017, 9:37 AM

## 2017-12-19 ENCOUNTER — Other Ambulatory Visit: Payer: Self-pay | Admitting: Internal Medicine

## 2017-12-19 DIAGNOSIS — Z139 Encounter for screening, unspecified: Secondary | ICD-10-CM

## 2018-01-08 ENCOUNTER — Ambulatory Visit
Admission: RE | Admit: 2018-01-08 | Discharge: 2018-01-08 | Disposition: A | Discharge status: Home / Self Care | Payer: Managed Care, Other (non HMO) | Source: Ambulatory Visit | Attending: Internal Medicine | Admitting: Internal Medicine

## 2018-01-08 DIAGNOSIS — Z139 Encounter for screening, unspecified: Secondary | ICD-10-CM

## 2018-11-06 ENCOUNTER — Emergency Department (HOSPITAL_COMMUNITY)
Admission: EM | Admit: 2018-11-06 | Discharge: 2018-11-07 | Disposition: A | Discharge status: Home / Self Care | Payer: Managed Care, Other (non HMO) | Attending: Emergency Medicine | Admitting: Emergency Medicine

## 2018-11-06 DIAGNOSIS — Z79899 Other long term (current) drug therapy: Secondary | ICD-10-CM | POA: Insufficient documentation

## 2018-11-06 DIAGNOSIS — F1010 Alcohol abuse, uncomplicated: Secondary | ICD-10-CM

## 2018-11-06 DIAGNOSIS — F10920 Alcohol use, unspecified with intoxication, uncomplicated: Secondary | ICD-10-CM

## 2018-11-06 DIAGNOSIS — Y908 Blood alcohol level of 240 mg/100 ml or more: Secondary | ICD-10-CM | POA: Insufficient documentation

## 2018-11-06 LAB — ETHANOL: Alcohol, Ethyl (B): 567 mg/dL (ref <10)

## 2018-11-06 LAB — CBC
HCT: 36 % (ref 36.0–46.0)
Hemoglobin: 11.8 g/dL — ABNORMAL LOW (ref 12.0–15.0)
MCH: 34.2 pg — ABNORMAL HIGH (ref 26.0–34.0)
MCHC: 32.8 g/dL (ref 30.0–36.0)
MCV: 104.3 fL — ABNORMAL HIGH (ref 80.0–100.0)
Platelets: 183 10*3/uL (ref 150–400)
RBC: 3.45 MIL/uL — ABNORMAL LOW (ref 3.87–5.11)
RDW: 14.2 % (ref 11.5–15.5)
WBC: 4.2 10*3/uL (ref 4.0–10.5)
nRBC: 0 % (ref 0.0–0.2)

## 2018-11-06 LAB — COMPREHENSIVE METABOLIC PANEL
ALT: 20 U/L (ref 0–44)
AST: 30 U/L (ref 15–41)
Albumin: 4.7 g/dL (ref 3.5–5.0)
Alkaline Phosphatase: 43 U/L (ref 38–126)
Anion gap: 15 (ref 5–15)
BUN: 17 mg/dL (ref 6–20)
CO2: 23 mmol/L (ref 22–32)
Calcium: 9 mg/dL (ref 8.9–10.3)
Chloride: 110 mmol/L (ref 98–111)
Creatinine, Ser: 0.79 mg/dL (ref 0.44–1.00)
GFR calc Af Amer: >60 mL/min (ref >60)
GFR calc non Af Amer: >60 mL/min (ref >60)
Glucose, Bld: 106 mg/dL — ABNORMAL HIGH (ref 70–99)
Potassium: 3.9 mmol/L (ref 3.5–5.1)
Sodium: 148 mmol/L — ABNORMAL HIGH (ref 135–145)
Total Bilirubin: 0.7 mg/dL (ref 0.3–1.2)
Total Protein: 8 g/dL (ref 6.5–8.1)

## 2018-11-06 LAB — CBG MONITORING, ED: Glucose-Capillary: 84 mg/dL (ref 70–99)

## 2018-11-06 MED ORDER — DEXTROSE 5 % AND 0.9 % NACL IV BOLUS
1000.0000 mL | Freq: Once | INTRAVENOUS | Status: AC
  Administered 2018-11-06: 1000 mL via INTRAVENOUS

## 2018-11-06 MED ORDER — SODIUM CHLORIDE 0.9 % IV BOLUS
1000.0000 mL | Freq: Once | INTRAVENOUS | Status: AC
  Administered 2018-11-06: 1000 mL via INTRAVENOUS

## 2018-11-06 NOTE — ED Notes (Signed)
Pt urinated in bed.  Pt awoke briefly and was very disoriented and unable to form words.  She kept grabbing at staffs face.  We were able to put on toilet and change her clothes.

## 2018-11-06 NOTE — ED Notes (Signed)
IV started per Doroteo Glassman rn

## 2018-11-06 NOTE — ED Triage Notes (Signed)
Pt arrived by [facility] responds to pain only.  Pt was intoxicated and aggressive toward family.  When family got patient into car to bring her to hospital she passed out.

## 2018-11-06 NOTE — ED Notes (Signed)
Pt has been incontinent at times due to intoxication needs 2x assist to change her. When asked pt stated her name she is slightly more alert.

## 2018-11-06 NOTE — ED Notes (Signed)
RN notified of abnormal lab 

## 2018-11-06 NOTE — ED Provider Notes (Signed)
[facility]-EMERGENCY DEPT Provider Note   CSN: [id number] Arrival date & time: 11/06/18  1437     History   Chief Complaint Chief Complaint  Patient presents with  . Alcohol Problem    HPI Kelsey [name] is a 48 y.o. female.  Patient with hx etoh abuse presents with etoh intoxication. Pt unable to quantify amount of etoh use today. Pt very poor/uncooperative historian - level 5 caveat. No report of trauma or fall. No report of fevers or specific physical complaint.   The history is provided by the patient and the EMS personnel. The history is limited by the condition of the patient.  Alcohol Problem     Past Medical History:  Diagnosis Date  . Acute ventilatory dependent respiratory failure 04/23/2015  . Alcohol abuse    History of 5 DWIs.  . Depression   . Intentional overdose of multiple pharmaceuticals 04/21/2015  . Normocytic anemia 04/23/2015  . 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 Suicide attempt [facility](HCC)     Patient Active Problem List   Diagnosis Date Noted  . Alcoholic hepatitis without ascites 06/02/2017  . AKI (acute kidney injury) (HCC)   . Hypernatremia   . Anemia of chronic disease   . Acute alcohol intoxication (HCC) 05/04/2015  . Alcohol use disorder, severe, dependence (HCC)   . Major depressive disorder, recurrent episode, moderate (HCC)   . Acute encephalopathy 04/23/2015  . High anion gap metabolic acidosis 04/23/2015  . Hyperglycemia 04/23/2015  . Hypokalemia 04/23/2015  . Normocytic anemia 04/23/2015  . Lactic acidosis 04/23/2015  . Acute ventilatory dependent respiratory failure 04/23/2015  . Hypotension with circulatory shock 04/23/2015  . Hyperchloremia 04/23/2015  . Polysubstance abuse (HCC) 04/22/2015  . 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 Intentional overdose of multiple pharmaceuticals 04/21/2015  . GAD (generalized anxiety disorder) 05/18/2014  . Alcohol  dependence (HCC) 05/15/2014    Past Surgical History:  Procedure Laterality Date  . AUGMENTATION MAMMAPLASTY Bilateral   . COSMETIC SURGERY       OB History   No obstetric history on file.      Home Medications    Prior to Admission medications   Medication Sig Start Date End Date Taking? Authorizing Provider  aspirin EC 81 MG tablet Take 1 tablet (81 mg total) by mouth daily. For heart health Patient not taking: Reported on 09/08/2016 05/18/14   Armandina StammerNwoko, Agnes I, NP  ferrous sulfate 325 (65 FE) MG tablet Take 1 tablet (325 mg total) by mouth daily with breakfast. For low iron Patient not taking: Reported on 09/08/2016 05/18/14   Armandina StammerNwoko, Agnes I, NP  folic acid (FOLVITE) 1 MG tablet Take 1 tablet (1 mg total) by mouth daily. 06/03/17   Lenox PondsSilva Zapata, Edwin, MD  Multiple Vitamin (MULTIVITAMIN WITH MINERALS) TABS tablet Take 1 tablet by mouth daily. For low vitamin 06/03/17   Randel PiggSilva Zapata, Dorma RussellEdwin, MD    Family History Family History  Problem Relation Age of Onset  . Psychiatric Illness Neg Hx     Social History Social History   Tobacco Use  . Smoking status: Never Smoker  . Smokeless tobacco: Never Used  Substance Use Topics  . Alcohol use: Yes    Alcohol/week: 30.0 standard drinks    Types: 30 Glasses of wine per week  . Drug use: No     Allergies   Patient has no known allergies.   Review of Systems Review of Systems  Unable to perform ROS: Patient unresponsive  level 5 caveat, intoxication, pt not cooperative w ros.    Physical Exam Updated Vital Signs BP 111/80   Pulse (!) 106   Resp 20   Physical Exam Vitals signs and nursing note reviewed.  Constitutional:      Appearance: Normal appearance. She is well-developed.  HENT:     Head: Atraumatic.     Nose: Nose normal.     Mouth/Throat:     Mouth: Mucous membranes are moist.     Pharynx: Oropharynx is clear.  Eyes:     General: No scleral icterus.    Conjunctiva/sclera: Conjunctivae normal.     Pupils:  Pupils are equal, round, and reactive to light.  Neck:     Musculoskeletal: Normal range of motion and neck supple. No neck rigidity.     Trachea: No tracheal deviation.  Cardiovascular:     Rate and Rhythm: Normal rate and regular rhythm.     Pulses: Normal pulses.     Heart sounds: No murmur. No friction rub. No gallop.   Pulmonary:     Effort: Pulmonary effort is normal. No respiratory distress.     Breath sounds: Normal breath sounds.  Abdominal:     General: Bowel sounds are normal. There is no distension.     Palpations: Abdomen is soft. There is no mass.     Tenderness: There is no abdominal tenderness.  Genitourinary:    Comments: No cva tenderness.  Musculoskeletal:        General: No swelling.  Skin:    General: Skin is warm and dry.     Findings: No rash.  Neurological:     Mental Status: She is alert.     Comments: Uncooperative w exam. Moves bil extremities purposefully w good strength. No facial asymmetry/weakness.   Psychiatric:     Comments: Poorly cooperative. ?intoxicated.       ED Treatments / Results  Labs (all labs ordered are listed, but only abnormal results are displayed) Labs Reviewed  CBC  COMPREHENSIVE METABOLIC PANEL  ETHANOL  RAPID URINE DRUG SCREEN, HOSP PERFORMED    EKG None  Radiology No results found.  Procedures Procedures (including critical care time)  Medications Ordered in ED Medications - No data to display   Initial Impression / Assessment and Plan / ED Course  I have reviewed the triage vital signs and the nursing notes.  Pertinent labs & imaging results that were available during my care of the patient were reviewed by me and considered in my medical decision making (see chart for details).  Labs sent.   Reviewed nursing notes and prior charts for additional history.   Recheck, pt up to bathroom with assistance. Appears intoxicated.   Labs reviewed - etoh very high. Iv ns bolus.  Additional recheck - pt  arousable, no new or specific c/o. Breathing comfortably. Protecting airway. abd soft nt. Chest cta.   Additional fluids.   2345 pt remains easily arousable. bp normal. Hr 94. rr 16.    Signed out to Dr Erma Heritage at shift change that due to very high etoh level, anticipate patient will need to remain in ED until AM, then reassess for clinical sobriety/ability to be safely discharged then (with referrals for AA, outpt etoh tx).   Final Clinical Impressions(s) / ED Diagnoses   Final diagnoses:  None    ED Discharge Orders    None       Cathren Laine, MD 11/06/18 2351

## 2018-11-06 NOTE — ED Notes (Signed)
Bed: WA27 Expected date:  Expected time:  Means of arrival:  Comments: EMS ETOH 

## 2018-11-06 NOTE — Discharge Instructions (Addendum)
It was our pleasure to provide your ER care today - we hope that you feel better.  Avoid drinking alcohol - follow up with AA, and use resource guide provided for additional community resources.   No driving for the next 8 hours, or any time when drinking alcohol.   Follow up with primary care doctor in the next few days.   Return to ER if worse, new symptoms, fevers, new or severe pain, trouble breathing, other concern.

## 2018-11-07 LAB — CBG MONITORING, ED
Glucose-Capillary: 116 mg/dL — ABNORMAL HIGH (ref 70–99)
Glucose-Capillary: 74 mg/dL (ref 70–99)
Glucose-Capillary: 85 mg/dL (ref 70–99)

## 2018-11-07 LAB — RAPID URINE DRUG SCREEN, HOSP PERFORMED
Amphetamines: NOT DETECTED
Barbiturates: NOT DETECTED
Benzodiazepines: NOT DETECTED
Cocaine: NOT DETECTED
Opiates: NOT DETECTED
Tetrahydrocannabinol: NOT DETECTED

## 2018-11-07 NOTE — ED Notes (Signed)
Discharge orders received.  Pt is Alert and oriented.  C/o mild pain in right wrist.  No apparent injury.  MD aware.  Disposition to home.

## 2018-11-07 NOTE — ED Notes (Signed)
EDMD at bedside

## 2018-11-07 NOTE — ED Provider Notes (Signed)
Patient re-evaluated this morning, she is now awake, alert, and in NAD. Admits to heavy EtOH abuse, no intentional overuse or SI, HI, AVH. She is awake and sober now. D/c with outpt follow-up.   Shaune Pollack, MD 11/07/18 [phone]

## 2018-11-07 NOTE — ED Provider Notes (Signed)
8:40 AM -a call was received from TTS stating that the patient has been involuntarily committed, and if she is discharged needs to have the IVC rescinded.  Currently the patient is alert, lucid, somewhat remorseful and knowledgeable about the situation, and the recommended treatments.  She states she has been alcoholic for 30 years.  She commonly drinks at work where she is able to hide it, she is employed at a [employer].  The IVC petition indicates that she was intoxicated and passed out and was in a car.  At this point the patient does not meet criteria for commitment.  I have rescinded her IVC.   Mancel Bale, MD 11/07/18 [phone]

## 2018-11-07 NOTE — ED Notes (Signed)
Pt. Ate [place] sandwich drink 2 Gatorades and a apple juice. Pt is currently resting in bed.

## 2018-11-07 NOTE — ED Notes (Signed)
Sent Md Shaune Pollack a message about the Pt's ETOH level which is 567, made Md aware that the pt does not have any am follow up labs. No new orders given at this time. I will continue to monitor.

## 2018-11-20 ENCOUNTER — Encounter (HOSPITAL_COMMUNITY): Payer: Self-pay | Admitting: Emergency Medicine

## 2018-11-20 ENCOUNTER — Emergency Department (HOSPITAL_COMMUNITY)
Admission: EM | Admit: 2018-11-20 | Discharge: 2018-11-20 | Disposition: A | Discharge status: Home / Self Care | Payer: Self-pay | Attending: Emergency Medicine | Admitting: Emergency Medicine

## 2018-11-20 DIAGNOSIS — Z79899 Other long term (current) drug therapy: Secondary | ICD-10-CM | POA: Insufficient documentation

## 2018-11-20 DIAGNOSIS — F10920 Alcohol use, unspecified with intoxication, uncomplicated: Secondary | ICD-10-CM | POA: Insufficient documentation

## 2018-11-20 LAB — RAPID URINE DRUG SCREEN, HOSP PERFORMED
Amphetamines: NOT DETECTED
Barbiturates: NOT DETECTED
Benzodiazepines: NOT DETECTED
Cocaine: NOT DETECTED
Opiates: NOT DETECTED
Tetrahydrocannabinol: NOT DETECTED

## 2018-11-20 NOTE — ED Provider Notes (Signed)
[facility]-EMERGENCY DEPT Provider Note   CSN: [id number] Arrival date & time: 11/20/18  1011   History   Chief Complaint Alcohol intoxication.   HPI Kelsey [name] is a 48 y.o. female with past medical history significant for alcohol abuse, depression who presents for evaluation of alcohol intoxication.  Patient was brought in by police as patient was sitting in [employer].  Manager called police as patient was suspected to be intoxicated.  Patient states she was just sitting down, however she did not have money to purchase anything and was hoping someone would buy her food.  Patient states "I drink all the time."  Patient states she is not interested in treatment at this time.  Was able to ambulate into department without assistance.  She has no complaints.  Denies SI, HI, AVH.  She states she cannot remember if she has history of DTs or alcohol withdrawal seizures.  Per police, patient blew over the legal limit on the breathalyzer test and was brought to emergency department for evaluation. Patient  ambulatory into department thought difficulty.  History provided by patient.  No interpreter was used.  HPI  Past Medical History:  Diagnosis Date  . Acute ventilatory dependent respiratory failure 04/23/2015  . Alcohol abuse    History of 5 DWIs.  . Depression   . Intentional overdose of multiple pharmaceuticals 04/21/2015  . Normocytic anemia 04/23/2015  . 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 Suicide attempt [facility](HCC)     Patient Active Problem List   Diagnosis Date Noted  . Alcoholic hepatitis without ascites 06/02/2017  . AKI (acute kidney injury) (HCC)   . Hypernatremia   . Anemia of chronic disease   . Acute alcohol intoxication (HCC) 05/04/2015  . Alcohol use disorder, severe, dependence (HCC)   . Major depressive disorder, recurrent episode, moderate (HCC)   . Acute encephalopathy 04/23/2015  . High anion gap metabolic  acidosis 04/23/2015  . Hyperglycemia 04/23/2015  . Hypokalemia 04/23/2015  . Normocytic anemia 04/23/2015  . Lactic acidosis 04/23/2015  . Acute ventilatory dependent respiratory failure 04/23/2015  . Hypotension with circulatory shock 04/23/2015  . Hyperchloremia 04/23/2015  . Polysubstance abuse (HCC) 04/22/2015  . 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 Intentional overdose of multiple pharmaceuticals 04/21/2015  . GAD (generalized anxiety disorder) 05/18/2014  . Alcohol dependence (HCC) 05/15/2014    Past Surgical History:  Procedure Laterality Date  . AUGMENTATION MAMMAPLASTY Bilateral   . COSMETIC SURGERY       OB History   No obstetric history on file.      Home Medications    Prior to Admission medications   Medication Sig Start Date End Date Taking? Authorizing Provider  ferrous sulfate 325 (65 FE) MG tablet Take 1 tablet (325 mg total) by mouth daily with breakfast. For low iron 05/18/14  Yes Nwoko, Agnes I, NP  folic acid (FOLVITE) 1 MG tablet Take 1 tablet (1 mg total) by mouth daily. 06/03/17  Yes Randel PiggSilva Zapata, Dorma RussellEdwin, MD  magnesium oxide (MAG-OX) 400 MG tablet Take 400 mg by mouth daily.   Yes [provider]  Multiple Vitamin (MULTIVITAMIN WITH MINERALS) TABS tablet Take 1 tablet by mouth daily. For low vitamin 06/03/17  Yes Randel PiggSilva Zapata, Dorma RussellEdwin, MD  Potassium 99 MG TABS Take 99 mg by mouth daily.   Yes [provider]  Thiamine HCl (VITAMIN B-1 PO) Take 1 tablet by mouth daily.   Yes [provider]  aspirin EC 81 MG  tablet Take 1 tablet (81 mg total) by mouth daily. For heart health Patient not taking: Reported on 09/08/2016 05/18/14   Sanjuana Kava, NP    Family History Family History  Problem Relation Age of Onset  . Psychiatric Illness Neg Hx     Social History Social History   Tobacco Use  . Smoking status: Never Smoker  . Smokeless tobacco: Never Used  Substance Use Topics  . Alcohol use: Yes     Alcohol/week: 30.0 standard drinks    Types: 30 Glasses of wine per week  . Drug use: No     Allergies   Patient has no known allergies.   Review of Systems Review of Systems  Constitutional: Negative.   HENT: Negative.   Respiratory: Negative.   Cardiovascular: Negative.   Gastrointestinal: Negative.   Genitourinary: Negative.   Musculoskeletal: Negative.   Skin: Negative.   Neurological: Negative.   Hematological: Negative.   Psychiatric/Behavioral: Negative.   All other systems reviewed and are negative.    Physical Exam Updated Vital Signs BP 93/66 (BP Location: Right Arm)   Pulse 70   Temp 98 F (36.7 C) (Oral)   Resp 19   SpO2 100%   Physical Exam Vitals signs and nursing note reviewed.  Constitutional:      General: She is not in acute distress.    Appearance: She is well-developed. She is not ill-appearing, toxic-appearing or diaphoretic.  HENT:     Head: Normocephalic and atraumatic.     Nose: Nose normal.     Mouth/Throat:     Mouth: Mucous membranes are moist.     Pharynx: Oropharynx is clear.  Eyes:     Pupils: Pupils are equal, round, and reactive to light.  Neck:     Musculoskeletal: Normal range of motion.  Cardiovascular:     Rate and Rhythm: Normal rate.     Pulses: Normal pulses.     Heart sounds: Normal heart sounds. No murmur. No gallop.   Pulmonary:     Effort: Pulmonary effort is normal. No respiratory distress.     Breath sounds: Normal breath sounds. No stridor. No wheezing, rhonchi or rales.  Abdominal:     General: There is no distension.     Tenderness: There is no abdominal tenderness. There is no guarding or rebound.  Musculoskeletal: Normal range of motion.     Comments: Ambulatory in room without difficulty. Moves all extremities without difficulty.  Skin:    General: Skin is warm and dry.     Comments: No rashes or lesions.  Neurological:     Mental Status: She is alert.     Comments: Patient able to follow commands.    Psychiatric:     Comments: Denies SI, HI, AVH.  Patient able to speak in full sentences without difficulty.  Mood appears appropriate.  Patient does not appear agitated or aggressive.  She has normal behavior.  Speech is coherent without slurring.  Normal affect.      ED Treatments / Results  Labs (all labs ordered are listed, but only abnormal results are displayed) Labs Reviewed  RAPID URINE DRUG SCREEN, HOSP PERFORMED    EKG None  Radiology No results found.  Procedures Procedures (including critical care time)  Medications Ordered in ED Medications - No data to display   Initial Impression / Assessment and Plan / ED Course  I have reviewed the triage vital signs and the nursing notes.  Pertinent labs & imaging results that were  available during my care of the patient were reviewed by me and considered in my medical decision making (see chart for details).  48 year old female who appears otherwise well presents for evaluation of alcohol intoxication.  Afebrile, nonseptic, non-ill-appearing.  [facility] and the manager called police for suspected intoxication.  Patient states "I drink my normal amount."  She states she has no complaints.  Denies SI, HI, AVH.  Patient ambulatory in department thought difficulty.  Patient states "I do not feel super intoxicated."  She is currently alert, lucid and understands recommendations for treatment for alcohol abuse.  Patient states she has been drinking for over 30 years and she not plan on stopping.  Clear to auscultation bilaterally without wheeze, rhonchi or rales.  Patient able to follow commands without difficulty.  AxO x3.  Nursing has ambulated patient.  She ambulate without difficulty, per nursing.  I have discussed patient with my attending Dr. Charm BargesButler. Patient was ambulatory and appears alert, lucid and appropriate on exam. Denies SI,HI, AVH. Do not feel patient needs labs at this  time. Patient likely at her baseline.  She is hemodynamically stable and appropriate for DC home at this time.  I discussed return precautions.  Patient voiced understanding and is agreeable for follow-up.  Low suspicion for emergent pathology at this time.     Final Clinical Impressions(s) / ED Diagnoses   Final diagnoses:  Alcoholic intoxication without complication [facility](HCC)    ED Discharge Orders    None       Henderly, Britni A, PA-C 11/20/18 1224    Terrilee FilesButler, Michael C, MD 11/20/18 1745

## 2018-11-20 NOTE — ED Triage Notes (Addendum)
Patient here via [employer] for intoxication at [facility]. Manager called [employer] due to suspecting intoxication. Patient was at the table eating. [employer] states patient was over the legal limit.

## 2018-11-20 NOTE — ED Notes (Signed)
Bed: WA27 Expected date:  Expected time:  Means of arrival:  Comments: EMS ETOH 

## 2018-11-20 NOTE — ED Notes (Signed)
Patient walked to restroom and out in the hallway to ask for lunch. Lunch given. Patient ate and is resting.

## 2019-07-22 ENCOUNTER — Other Ambulatory Visit: Payer: Self-pay

## 2019-07-22 DIAGNOSIS — Z20822 Contact with and (suspected) exposure to covid-19: Secondary | ICD-10-CM

## 2019-07-23 LAB — NOVEL CORONAVIRUS, NAA: SARS-CoV-2, NAA: NOT DETECTED

## 2021-02-14 ENCOUNTER — Other Ambulatory Visit: Payer: Self-pay | Admitting: Internal Medicine

## 2021-02-14 DIAGNOSIS — K760 Fatty (change of) liver, not elsewhere classified: Secondary | ICD-10-CM

## 2021-03-08 ENCOUNTER — Other Ambulatory Visit: Payer: Self-pay

## 2021-03-23 ENCOUNTER — Ambulatory Visit
Admission: RE | Admit: 2021-03-23 | Discharge: 2021-03-23 | Disposition: A | Discharge status: Home / Self Care | Payer: 59 | Source: Ambulatory Visit | Attending: Internal Medicine | Admitting: Internal Medicine

## 2021-03-23 DIAGNOSIS — K760 Fatty (change of) liver, not elsewhere classified: Secondary | ICD-10-CM

## 2021-07-07 IMAGING — US US ABDOMEN LIMITED
1 series · 14 of 25 positions shown · non-contrast
Comparison: 06/02/2017

CLINICAL DATA: Follow-up fatty liver

EXAM:
ULTRASOUND ABDOMEN LIMITED RIGHT UPPER QUADRANT

[Series 1: us abdomen limited · 0.20mm/px · 14 of 55 slices shown]
[im 1/55]
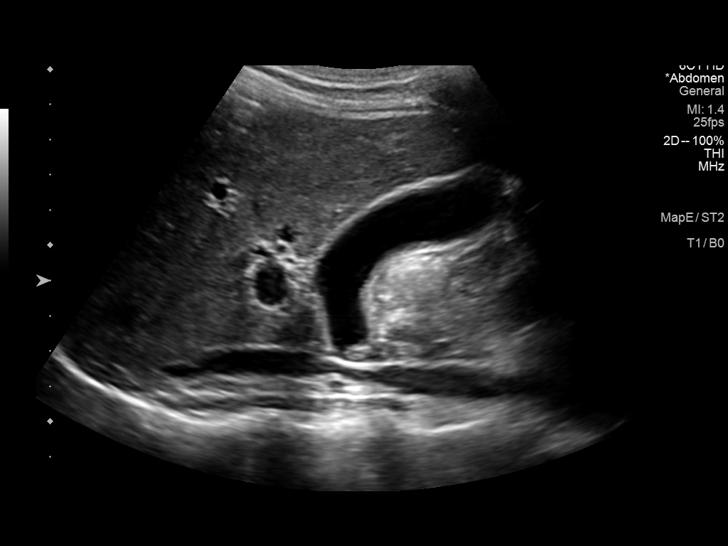
[im 5/55]
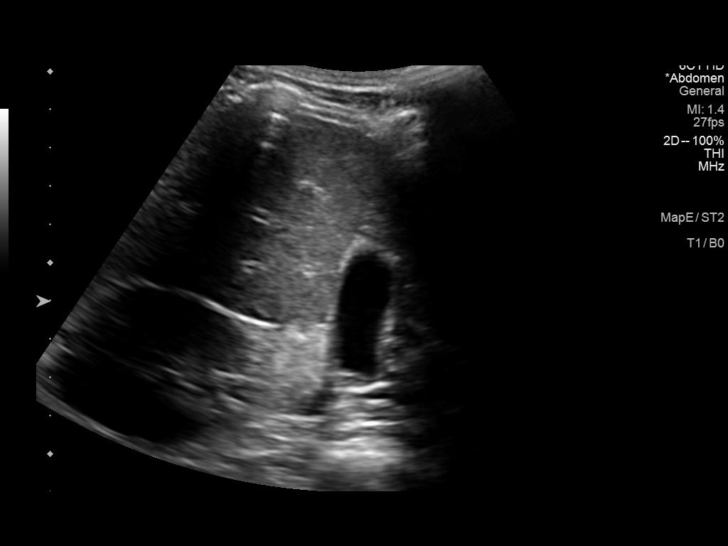
[im 10/55]
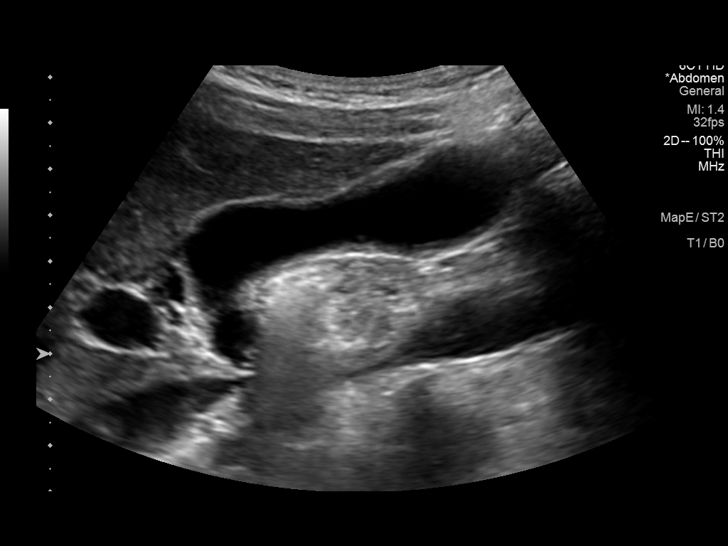
[im 14/55]
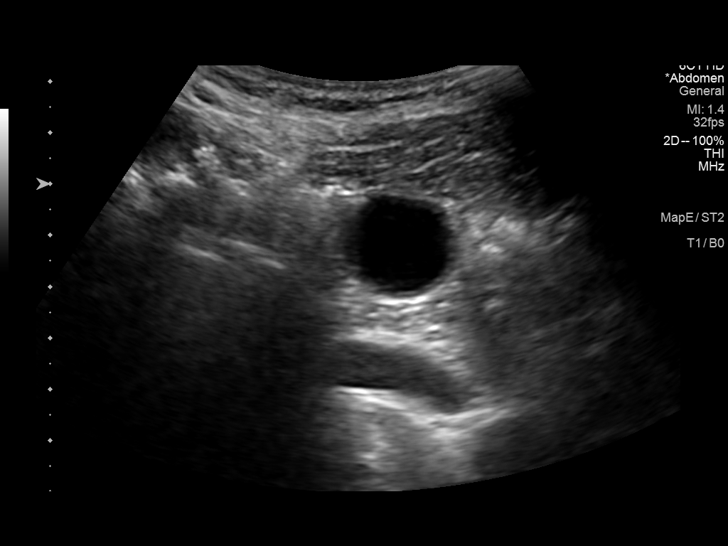
[im 19/55]
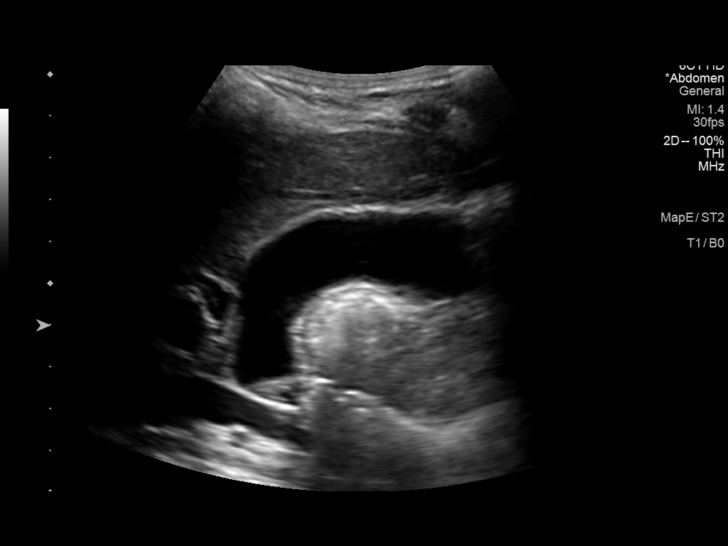
[im 21/55]
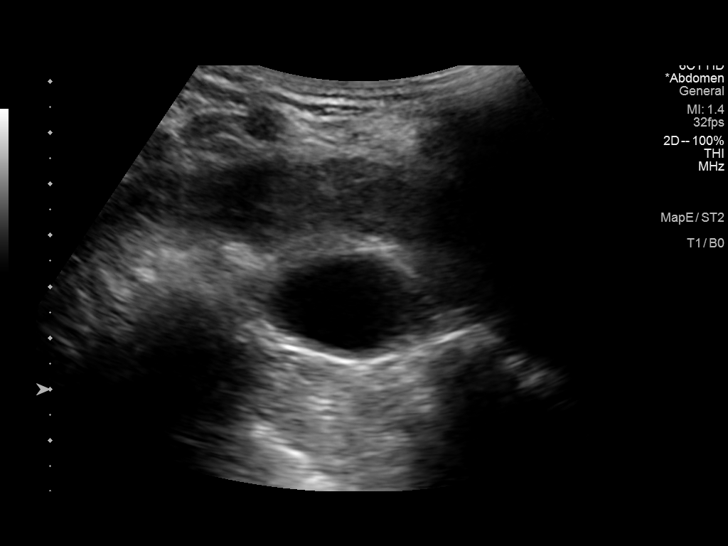
[im 25/55]
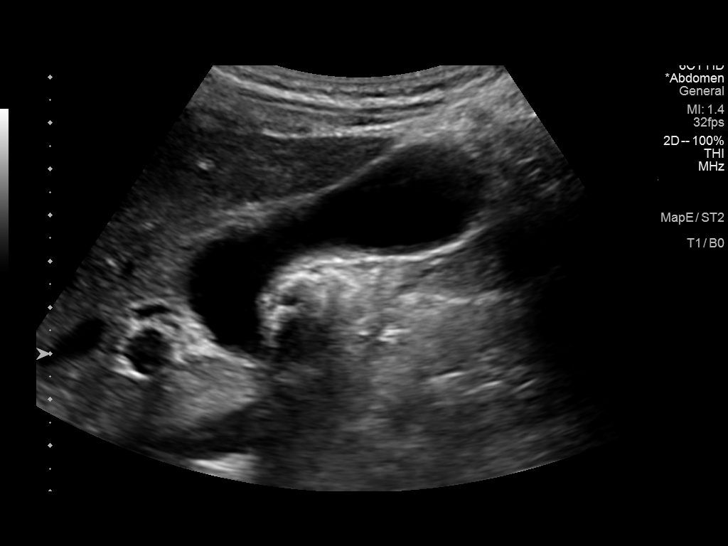
[im 30/55]
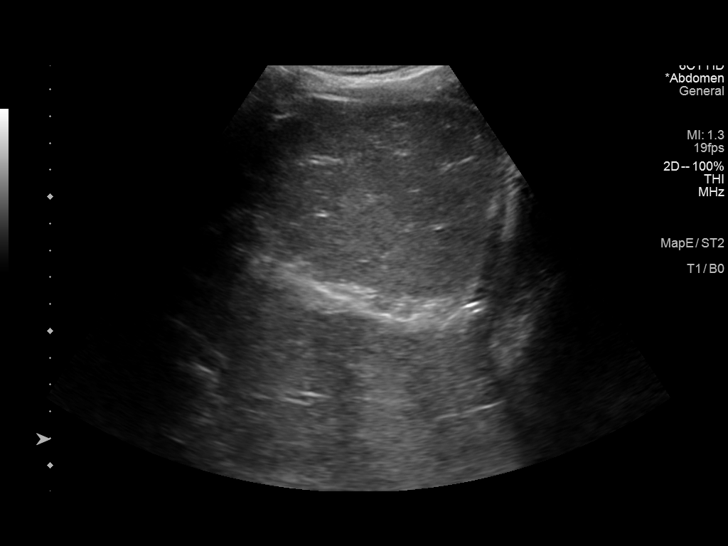
[im 34/55]
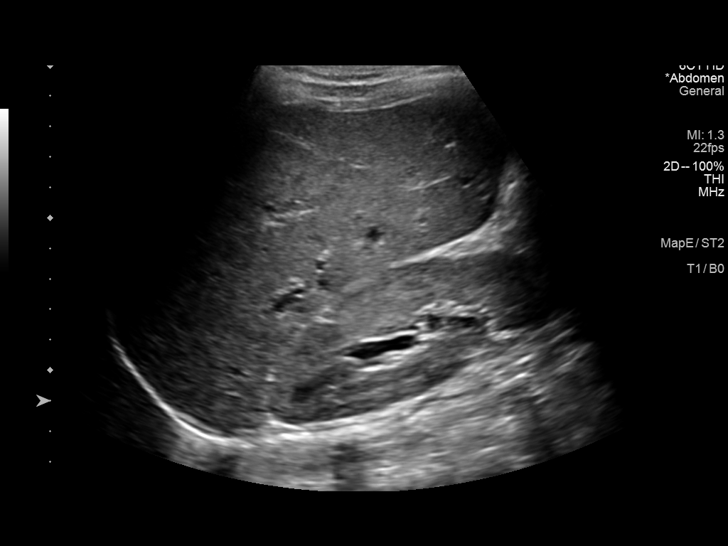
[im 37/55]
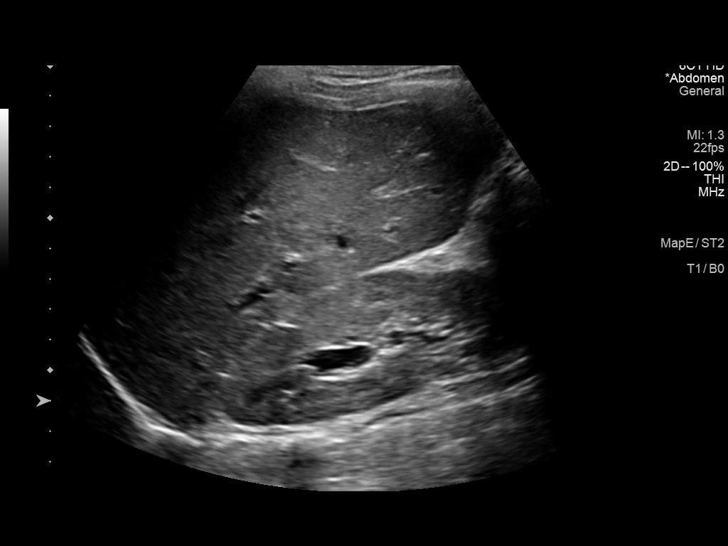
[im 41/55]
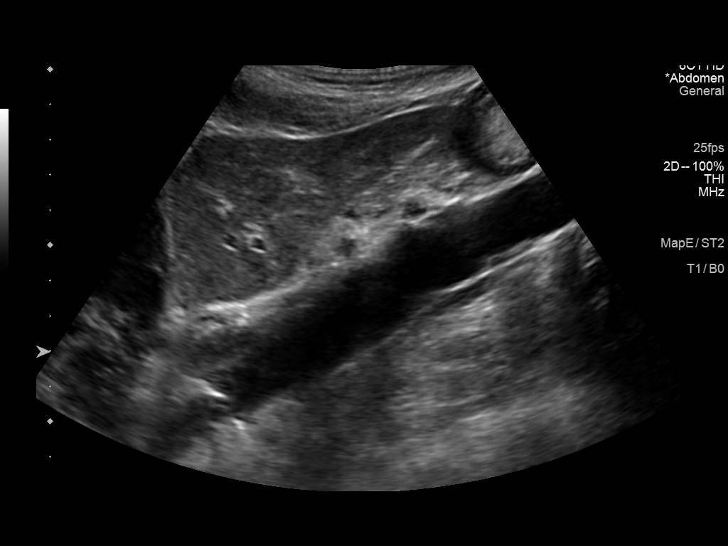
[im 46/55]
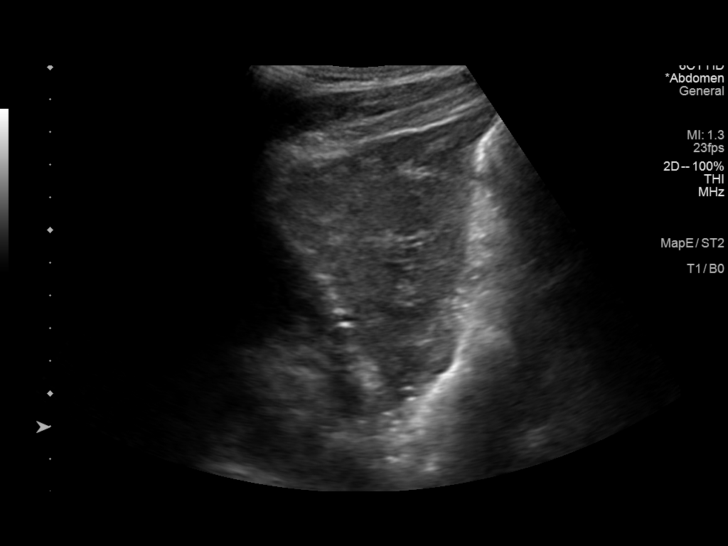
[im 50/55]
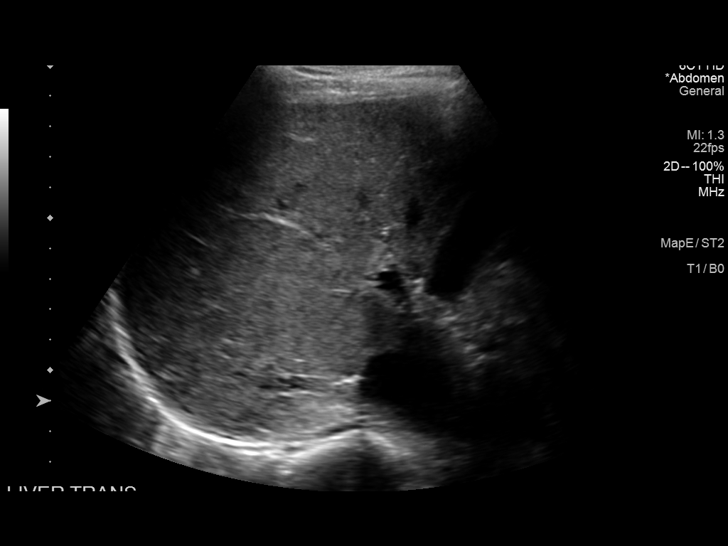
[im 55/55]
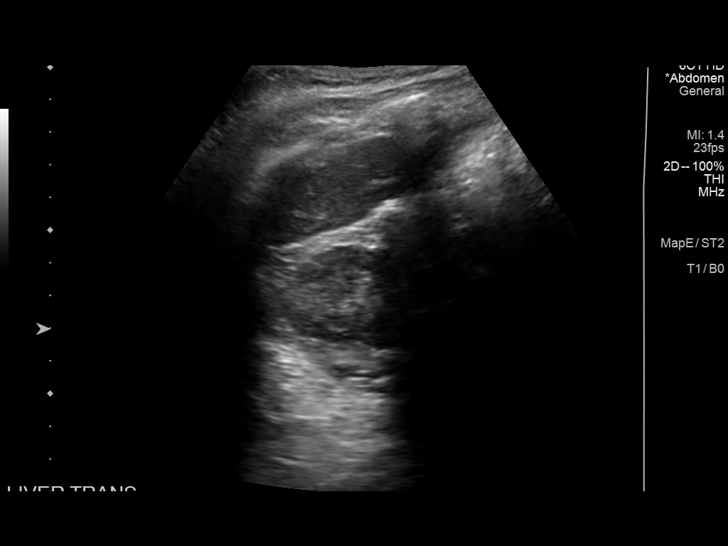

[14 of 25 positions shown; findings below may reference images not displayed]

FINDINGS: Gallbladder:

No gallstones or wall thickening visualized. No sonographic Murphy
sign noted by sonographer.

Common bile duct:

Diameter: 2.2 mm

Liver:

Liver more homogeneous in appearance today. Echogenicity within
normal limits. No focal hepatic abnormality. Portal vein is patent
on color Doppler imaging with normal direction of blood flow towards
the liver.

Other: None.
IMPRESSION: Negative examination

## 2021-12-01 DIAGNOSIS — R69 Illness, unspecified: Secondary | ICD-10-CM | POA: Diagnosis not present

## 2021-12-01 DIAGNOSIS — M25519 Pain in unspecified shoulder: Secondary | ICD-10-CM | POA: Diagnosis not present

## 2022-02-20 DIAGNOSIS — Z124 Encounter for screening for malignant neoplasm of cervix: Secondary | ICD-10-CM | POA: Diagnosis not present

## 2022-02-20 DIAGNOSIS — Z0000 Encounter for general adult medical examination without abnormal findings: Secondary | ICD-10-CM | POA: Diagnosis not present

## 2022-02-20 DIAGNOSIS — Z13220 Encounter for screening for lipoid disorders: Secondary | ICD-10-CM | POA: Diagnosis not present

## 2022-08-14 DIAGNOSIS — Z0000 Encounter for general adult medical examination without abnormal findings: Secondary | ICD-10-CM | POA: Diagnosis not present

## 2022-08-14 DIAGNOSIS — E7800 Pure hypercholesterolemia, unspecified: Secondary | ICD-10-CM | POA: Diagnosis not present

## 2022-09-14 DIAGNOSIS — E538 Deficiency of other specified B group vitamins: Secondary | ICD-10-CM | POA: Diagnosis not present

## 2022-09-14 DIAGNOSIS — E559 Vitamin D deficiency, unspecified: Secondary | ICD-10-CM | POA: Diagnosis not present

## 2022-09-14 DIAGNOSIS — R944 Abnormal results of kidney function studies: Secondary | ICD-10-CM | POA: Diagnosis not present

## 2023-10-22 ENCOUNTER — Other Ambulatory Visit: Payer: Self-pay | Admitting: Family Medicine

## 2023-10-22 DIAGNOSIS — Z1231 Encounter for screening mammogram for malignant neoplasm of breast: Secondary | ICD-10-CM

## 2023-11-15 ENCOUNTER — Ambulatory Visit: Payer: Commercial Managed Care - HMO

## 2023-12-03 ENCOUNTER — Ambulatory Visit
Admission: RE | Admit: 2023-12-03 | Discharge: 2023-12-03 | Disposition: A | Discharge status: Home / Self Care | Payer: Commercial Managed Care - HMO | Source: Ambulatory Visit | Attending: Family Medicine | Admitting: Family Medicine

## 2023-12-03 DIAGNOSIS — Z1231 Encounter for screening mammogram for malignant neoplasm of breast: Secondary | ICD-10-CM

## 2024-07-17 ENCOUNTER — Other Ambulatory Visit: Payer: Self-pay | Admitting: Medical Genetics

## 2024-09-28 ENCOUNTER — Encounter (HOSPITAL_COMMUNITY): Payer: Self-pay

## 2024-09-28 ENCOUNTER — Emergency Department (HOSPITAL_COMMUNITY)
Admission: EM | Admit: 2024-09-28 | Discharge: 2024-09-28 | Disposition: A | Discharge status: Home / Self Care | Attending: Emergency Medicine | Admitting: Emergency Medicine

## 2024-09-28 ENCOUNTER — Other Ambulatory Visit: Payer: Self-pay

## 2024-09-28 DIAGNOSIS — R110 Nausea: Secondary | ICD-10-CM | POA: Diagnosis not present

## 2024-09-28 DIAGNOSIS — R42 Dizziness and giddiness: Secondary | ICD-10-CM | POA: Diagnosis present

## 2024-09-28 DIAGNOSIS — R112 Nausea with vomiting, unspecified: Secondary | ICD-10-CM

## 2024-09-28 LAB — CBC
HCT: 36.4 % (ref 36.0–46.0)
Hemoglobin: 12 g/dL (ref 12.0–15.0)
MCH: 32.1 pg (ref 26.0–34.0)
MCHC: 33 g/dL (ref 30.0–36.0)
MCV: 97.3 fL (ref 80.0–100.0)
Platelets: 204 K/uL (ref 150–400)
RBC: 3.74 MIL/uL — ABNORMAL LOW (ref 3.87–5.11)
RDW: 13 % (ref 11.5–15.5)
WBC: 7.8 K/uL (ref 4.0–10.5)
nRBC: 0 % (ref 0.0–0.2)

## 2024-09-28 LAB — URINALYSIS, ROUTINE W REFLEX MICROSCOPIC
Bilirubin Urine: NEGATIVE
Glucose, UA: NEGATIVE mg/dL
Hgb urine dipstick: NEGATIVE
Ketones, ur: NEGATIVE mg/dL
Leukocytes,Ua: NEGATIVE
Nitrite: NEGATIVE
Protein, ur: NEGATIVE mg/dL
Specific Gravity, Urine: 1.023 (ref 1.005–1.030)
pH: 7 (ref 5.0–8.0)

## 2024-09-28 LAB — COMPREHENSIVE METABOLIC PANEL WITH GFR
ALT: 21 U/L (ref 0–44)
AST: 30 U/L (ref 15–41)
Albumin: 4.6 g/dL (ref 3.5–5.0)
Alkaline Phosphatase: 59 U/L (ref 38–126)
Anion gap: 12 (ref 5–15)
BUN: 17 mg/dL (ref 6–20)
CO2: 23 mmol/L (ref 22–32)
Calcium: 9.1 mg/dL (ref 8.9–10.3)
Chloride: 104 mmol/L (ref 98–111)
Creatinine, Ser: 1.05 mg/dL — ABNORMAL HIGH (ref 0.44–1.00)
GFR, Estimated: >60 mL/min (ref >60)
Glucose, Bld: 97 mg/dL (ref 70–99)
Potassium: 3.7 mmol/L (ref 3.5–5.1)
Sodium: 139 mmol/L (ref 135–145)
Total Bilirubin: 0.5 mg/dL (ref 0.0–1.2)
Total Protein: 7.5 g/dL (ref 6.5–8.1)

## 2024-09-28 LAB — URINE DRUG SCREEN
Amphetamines: NEGATIVE
Barbiturates: NEGATIVE
Benzodiazepines: NEGATIVE
Cocaine: NEGATIVE
Fentanyl: NEGATIVE
Methadone Scn, Ur: NEGATIVE
Opiates: NEGATIVE
Tetrahydrocannabinol: NEGATIVE

## 2024-09-28 LAB — CBG MONITORING, ED: Glucose-Capillary: 112 mg/dL — ABNORMAL HIGH (ref 70–99)

## 2024-09-28 MED ORDER — DIAZEPAM 5 MG/ML IJ SOLN: 2.5000 mg | Freq: Once | INTRAMUSCULAR | Status: DC

## 2024-09-28 MED ORDER — LORAZEPAM 2 MG/ML IJ SOLN
1.0000 mg | Freq: Once | INTRAMUSCULAR | Status: AC
  Administered 2024-09-28: 1 mg via INTRAVENOUS
  Filled 2024-09-28: qty 1

## 2024-09-28 MED ORDER — SODIUM CHLORIDE 0.9 % IV BOLUS
1000.0000 mL | Freq: Once | INTRAVENOUS | Status: AC
  Administered 2024-09-28: 1000 mL via INTRAVENOUS

## 2024-09-28 MED ORDER — LORAZEPAM 2 MG/ML IJ SOLN
0.5000 mg | Freq: Once | INTRAMUSCULAR | Status: AC
  Administered 2024-09-28: 0.5 mg via INTRAVENOUS
  Filled 2024-09-28: qty 1

## 2024-09-28 MED ORDER — HYDROCODONE-ACETAMINOPHEN 5-325 MG PO TABS
1.0000 | ORAL_TABLET | Freq: Once | ORAL | Status: DC
Start: 2024-09-28 — End: 2024-09-28

## 2024-09-28 MED ORDER — ONDANSETRON 4 MG PO TBDP: 4.0000 mg | ORAL_TABLET | Freq: Three times a day (TID) | ORAL | 0 refills | Status: AC | PRN

## 2024-09-28 MED ORDER — KETOROLAC TROMETHAMINE 30 MG/ML IJ SOLN
30.0000 mg | Freq: Once | INTRAMUSCULAR | Status: AC
  Administered 2024-09-28: 30 mg via INTRAVENOUS
  Filled 2024-09-28: qty 1

## 2024-09-28 NOTE — ED Notes (Addendum)
 Pt states she feels jump and itchy, like she is going through withdrawals she cites no h/a or hallucinations

## 2024-09-28 NOTE — ED Notes (Addendum)
 Pt states she is unable to urinate at this time. Educated abt in and out catheter. Pt attempting to urinate

## 2024-09-28 NOTE — ED Triage Notes (Signed)
 PT arrives via EMS from home. PT took 1 dose of naltrexone that is not prescribed to her a couple of hours ago. Since then, pt reports nausea, dizziness, and generalized weakness.

## 2024-09-28 NOTE — ED Provider Notes (Signed)
  Physical Exam  BP 108/74   Pulse 82   Temp 99 F (37.2 C) (Oral)   Resp (!) 25   SpO2 96%   Physical Exam  Procedures  Procedures  ED Course / MDM   Clinical Course as of 09/28/24 2300  Mon Sep 28, 2024  1538 Mild symptomatic improvement with Ativan , was able to have 1 sip of water but still having persistent nausea.  Patient adamantly denies any other substance use other than the kratom.  Redose Ativan  and reevaluate. [KH]    Clinical Course User Index [KH] Theophilus Pagan, MD   Medical Decision Making Amount and/or Complexity of Data Reviewed Labs: ordered.  Risk Prescription drug management.   Received care of pt. Took naltrexone and developed nausea, vomiting. Hx of daily kratom use.  Given symptomatic management, continued observation in ED>  Improved after ativan . Tolerating po, ambulating with steady gait.   Doubt cva/ich/has no sig electrolyte abnormalities or anemia.        Dreama Longs, MD 09/29/24 [phone]

## 2024-09-28 NOTE — ED Provider Notes (Signed)
 [place] [facility] AT [facility] Provider Note  CSN: [id number] Arrival date & time: 09/28/24 1314  Chief Complaint(s) Dizziness  HPI Kelsey [name] is a 53 y.o. female with past medical history as below, significant for alcohol  use disorder, MDD with prior SI attempt, anemia who presents to the ED with complaint of dizziness and nausea.  Patient reports she took a dose of naltrexone this morning around 11 AM, unsure of the dose.  Reports she felt onset of severe nausea and dizziness about 20 to 30 minutes after intake.  Denies recent illness, otherwise normal health prior to intake.  Mother states patient is also taking kratom daily, this has been ongoing for the past year.  Patient states she takes it for energy.  Patient states she is currently experiencing severe nausea, unable to move in the bed due to feeling she is going to vomit.  Has not vomited today.  Presents with mother who provides additional history.  Past Medical History Past Medical History:  Diagnosis Date   Acute ventilatory dependent respiratory failure 04/23/2015   Alcohol  abuse    History of 5 DWIs.   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   Suicide attempt [facility])    Patient Active Problem List   Diagnosis Date Noted   Alcoholic hepatitis without ascites (HCC) 06/02/2017   AKI (acute kidney injury)    Hypernatremia    Anemia of chronic disease    Acute alcohol  intoxication 05/04/2015   Alcohol  use disorder, severe, dependence (HCC)    Major depressive disorder, recurrent episode, moderate (HCC)    Acute encephalopathy 04/23/2015   High anion gap metabolic acidosis 04/23/2015   Hyperglycemia 04/23/2015   Hypokalemia 04/23/2015   Normocytic anemia 04/23/2015   Lactic acidosis 04/23/2015   Acute ventilatory dependent respiratory failure 04/23/2015    Hypotension with circulatory shock 04/23/2015   Hyperchloremia 04/23/2015   Polysubstance abuse (HCC) 04/22/2015   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 Authorizing Provider  aspirin  EC 81 MG tablet Take 1 tablet (81 mg total) by mouth daily. For heart health Patient not taking: Reported on 09/08/2016 05/18/14   Collene Gouge I, NP  ferrous sulfate  325 (65 FE) MG tablet Take 1 tablet (325 mg total) by mouth daily with breakfast. For low iron 05/18/14   Collene Gouge I, NP  folic acid  (FOLVITE ) 1 MG tablet Take 1 tablet (1 mg total) by mouth daily. 06/03/17   Nikki Hansel Atlas, MD  magnesium  oxide (MAG-OX) 400 MG tablet Take 400 mg by mouth daily.    [provider]  Multiple Vitamin (MULTIVITAMIN WITH MINERALS) TABS tablet Take 1 tablet by mouth daily. For low vitamin 06/03/17   Silva Zapata, Atlas, MD  Potassium 99 MG TABS Take 99 mg by mouth daily.    [provider]  Thiamine  HCl (VITAMIN B-1 PO) Take 1 tablet by mouth daily.    [provider]  Past Surgical History Past Surgical History:  Procedure Laterality Date   AUGMENTATION MAMMAPLASTY Bilateral    COSMETIC SURGERY     Family History Family History  Problem Relation Age of Onset   Psychiatric Illness Neg Hx     Social History Social History   Tobacco Use   Smoking status: Never   Smokeless tobacco: Never  Substance Use Topics   Alcohol  use: Yes    Alcohol /week: 30.0 standard drinks of alcohol     Types: 30 Glasses of wine per week   Drug use: No   Allergies Patient has no known  allergies.  Review of Systems A thorough review of systems was obtained and all systems are negative except as noted in the HPI and PMH.   Physical Exam Vital Signs  I have reviewed the triage vital signs BP (!) 140/89 (BP Location: Right Arm)   Pulse 75   Temp 98.4 F (36.9 C) (Oral)   Resp 18   SpO2 98%  Physical Exam General: Fatigued appearing. Alert. NAD HEENT: Normocephalic. White sclera. No rhinorrhea or congestion CV: RRR without murmur Pulm: CTAB. Normal WOB on RA. No wheezing Abdomen: Soft, non-distended. +BS.  Nonspecific tenderness diffusely.  No rebound tenderness or guarding. Ext: Well perfused. Cap refill < 3 seconds Skin: Warm, dry. No rashes noted  ED Results and Treatments Labs (all labs ordered are listed, but only abnormal results are displayed) Labs Reviewed  COMPREHENSIVE METABOLIC PANEL WITH GFR - Abnormal; Notable for the following components:      Result Value   Creatinine, Ser 1.05 (*)    All other components within normal limits  CBC - Abnormal; Notable for the following components:   RBC 3.74 (*)    All other components within normal limits  CBG MONITORING, ED - Abnormal; Notable for the following components:   Glucose-Capillary 112 (*)    All other components within normal limits  URINALYSIS, ROUTINE W REFLEX MICROSCOPIC  URINE DRUG SCREEN                                                                                                                          Radiology No results found.  Pertinent labs & imaging results that were available during my care of the patient were reviewed by me and considered in my medical decision making (see MDM for details).  Medications Ordered in ED Medications  LORazepam  (ATIVAN ) injection 0.5 mg (has no administration in time range)  LORazepam  (ATIVAN ) injection 0.5 mg (0.5 mg Intravenous Given 09/28/24 1452)  sodium chloride  0.9 % bolus 1,000 mL (1,000 mLs Intravenous New Bag/Given 09/28/24 1456)  Medical Decision Making / ED Course    Medical Decision Making:    Kelsey [name] is a 53 y.o. female with past medical history as below, significant for alcohol  use disorder, MDD with prior SI attempt, anemia who presents to the ED with complaint of dizziness and nausea. The complaint involves an extensive differential diagnosis and also carries with it a high risk of complications and morbidity.  Serious etiology was considered. Ddx includes but is not limited to: Substance induced, withdrawal, dehydration  Complete initial physical exam performed, notably the patient was in no distress.    Reviewed and confirmed nursing documentation for past medical history, family history, social history.  Vital signs reviewed.    53 year old female with history as above presenting with acute onset dizziness and nausea after taking naltrexone, unclear dose.  Setting of underlying daily kratom use, possibly precipitated acute withdrawal symptoms versus side effect of naltrexone.  QTc >500, and given Ativan  0.5 mg for nausea and possible withdrawal.   Clinical Course as of 09/28/24 1539  Mon Sep 28, 2024  1538 Mild symptomatic improvement with Ativan , was able to have 1 sip of water but still having persistent nausea.  Patient adamantly denies any other substance use other than the kratom.  Redose Ativan  and reevaluate. [KH]    Clinical Course User Index [KH] Theophilus Pagan, MD     Additional history obtained: -Additional history obtained from family  Lab Tests: -I ordered, reviewed, and interpreted labs.   The pertinent results include:   Labs Reviewed  COMPREHENSIVE METABOLIC PANEL WITH GFR - Abnormal; Notable for the following components:      Result Value   Creatinine, Ser 1.05 (*)    All other components within normal limits  CBC - Abnormal; Notable for the  following components:   RBC 3.74 (*)    All other components within normal limits  CBG MONITORING, ED - Abnormal; Notable for the following components:   Glucose-Capillary 112 (*)    All other components within normal limits  URINALYSIS, ROUTINE W REFLEX MICROSCOPIC  URINE DRUG SCREEN    EKG   EKG Interpretation Date/Time:  Monday September 28 2024 13:28:11 EST Ventricular Rate:  75 PR Interval:  187 QRS Duration:  97 QT Interval:  475 QTC Calculation: 531 R Axis:   47  Text Interpretation: Sinus rhythm RSR' in V1 or V2, probably normal variant Consider anterior infarct Prolonged QT interval Confirmed by Laurice Coy [phone]) on 09/28/2024 2:57:12 PM         Medicines ordered and prescription drug management: Meds ordered this encounter  Medications   LORazepam  (ATIVAN ) injection 0.5 mg   sodium chloride  0.9 % bolus 1,000 mL   LORazepam  (ATIVAN ) injection 0.5 mg    -I have reviewed the patients home medicines and have made adjustments as needed  Cardiac Monitoring: The patient was maintained on a cardiac monitor.  I personally viewed and interpreted the cardiac monitored which showed an underlying rhythm of: NSR, prolonged QT Continuous pulse oximetry interpreted by myself, 98% on RA.    Social Determinants of Health:  Diagnosis or treatment significantly limited by social determinants of health: polysubstance abuse   Reevaluation: After the interventions noted above, I reevaluated the patient and found that they have improved  Co morbidities that complicate the patient evaluation  Past Medical History:  Diagnosis Date   Acute ventilatory dependent respiratory failure 04/23/2015   Alcohol  abuse    History of 5 DWIs.   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    Final Clinical Impression(s) / ED Diagnoses Final diagnoses:  None        Theophilus Pagan, MD 09/28/24 1559    Laurice Maude BROCKS, MD 09/30/24 1154

## 2024-10-22 DIAGNOSIS — G40409 Other generalized epilepsy and epileptic syndromes, not intractable, without status epilepticus: Secondary | ICD-10-CM | POA: Insufficient documentation

## 2024-10-26 ENCOUNTER — Encounter: Payer: Self-pay | Admitting: Neurology

## 2024-10-26 ENCOUNTER — Telehealth: Payer: Self-pay

## 2024-10-26 ENCOUNTER — Ambulatory Visit: Admitting: Neurology

## 2024-10-26 ENCOUNTER — Telehealth (HOSPITAL_COMMUNITY): Payer: Self-pay

## 2024-10-26 ENCOUNTER — Other Ambulatory Visit (HOSPITAL_COMMUNITY): Payer: Self-pay

## 2024-10-26 VITALS — BP 131/73 | HR 89 | Ht 67.0 in | Wt 149.0 lb

## 2024-10-26 DIAGNOSIS — R569 Unspecified convulsions: Secondary | ICD-10-CM

## 2024-10-26 MED ORDER — NAYZILAM 5 MG/0.1ML NA SOLN
5.0000 mg | NASAL | 0 refills | Status: AC | PRN
  Filled 2024-10-26: qty 2, 30d supply, fill #0

## 2024-10-26 NOTE — Progress Notes (Signed)
 "   [facility]  PATIENT: Kelsey [name] DOB: Oct 01, 1971  REQUESTING CLINICIAN: Pridgen, Taylar, NP HISTORY FROM: Patient and Boyfriend who witnessed the event (over the phone) REASON FOR VISIT: Seizure    HISTORICAL  CHIEF COMPLAINT:  Chief Complaint  Patient presents with   New Patient (Initial Visit)    Room 13 With son possible seizure     HISTORY OF PRESENT ILLNESS:  Discussed the use of AI scribe software for clinical note transcription with the patient, who gave verbal consent to proceed.  Kelsey [name] is a 53 year old female with history of anxiety/depression, alcohol  use in remission, who presents after her first lifetime seizure episode.  She experienced a seizure episode while at a movie theater. On the day of the event, she consumed Kratom, caffeine pills, and diuretics, and did not have her usual water bottle, consuming salty popcorn instead, which may have contributed to dehydration. During the episode, she was reportedly stiff, vocalizing, and had frothing at the mouth. She was assisted by off-duty nurses and later evaluated at a hospital. She did not sustain any injuries and did not bite her tongue. This was her first seizure-like event.  Her boyfriend, Madeleine, described her as stiff with eyes rolled back and frothing at the mouth for about two and a half minutes. She was unresponsive initially and gradually regained awareness but was confused and disoriented for some time after the episode. She eventually agreed to go to the hospital where initial tests, including a CT scan, EEG were normal.  She has been using Kratom for about five years and is attempting to reduce her intake. She also takes Wellbutrin  300 mcg, which was increased from 150 mcg last year due to persistent tiredness. She has not consumed alcohol  recently, with only three glasses of wine in the last six months. She follows a keto diet and uses diuretics to manage water  retention, which also contain caffeine.  No previous episodes of seizures or confusion. There is no history of staring spells or periods of confusion. She has not experienced any falls recently. No tongue biting or injuries during the episode and no soreness or aches following the event.     OTHER MEDICAL CONDITIONS: Anxiety/depression, alcohol  abuse on admission   REVIEW OF SYSTEMS: Full 14 system review of systems performed and negative with exception of: As noted in the HPI  ALLERGIES: Allergies[1]  HOME MEDICATIONS: Outpatient Medications Prior to Visit  Medication Sig Dispense Refill   ferrous sulfate  325 (65 FE) MG tablet Take 1 tablet (325 mg total) by mouth daily with breakfast. For low iron  3   folic acid  (FOLVITE ) 1 MG tablet Take 1 tablet (1 mg total) by mouth daily. 30 tablet 0   magnesium  oxide (MAG-OX) 400 MG tablet Take 400 mg by mouth daily. (Patient taking differently: Take 400 mg by mouth at bedtime as needed.)     Multiple Vitamin (MULTIVITAMIN WITH MINERALS) TABS tablet Take 1 tablet by mouth daily. For low vitamin 30 tablet 0   ondansetron  (ZOFRAN -ODT) 4 MG disintegrating tablet Take 1 tablet (4 mg total) by mouth every 8 (eight) hours as needed for nausea or vomiting. 20 tablet 0   Potassium 99 MG TABS Take 99 mg by mouth daily.     Thiamine  HCl (VITAMIN B-1 PO) Take 1 tablet by mouth daily. (Patient taking differently: Take 1 tablet by mouth at bedtime as needed.)     aspirin  EC 81 MG tablet Take 1  tablet (81 mg total) by mouth daily. For heart health (Patient not taking: Reported on 10/26/2024)     No facility-administered medications prior to visit.    PAST MEDICAL HISTORY: Past Medical History:  Diagnosis Date   Acute ventilatory dependent respiratory failure 04/23/2015   Alcohol  abuse    History of 5 DWIs.   Depression    Intentional overdose of multiple pharmaceuticals 04/21/2015   Normocytic anemia 04/23/2015   Psychoactive substance-induced mood  disorder (HCC) 04/22/2015   Suicide attempt (HCC)     PAST SURGICAL HISTORY: Past Surgical History:  Procedure Laterality Date   AUGMENTATION MAMMAPLASTY Bilateral    COSMETIC SURGERY      FAMILY HISTORY: Family History  Problem Relation Age of Onset   Psychiatric Illness Neg Hx     SOCIAL HISTORY: Social History   Socioeconomic History   Marital status: Single    Spouse name: Not on file   Number of children: Not on file   Years of education: Not on file   Highest education level: Not on file  Occupational History   Not on file  Tobacco Use   Smoking status: Never   Smokeless tobacco: Never  Substance and Sexual Activity   Alcohol  use: Yes    Alcohol /week: 30.0 standard drinks of alcohol     Types: 30 Glasses of wine per week   Drug use: No   Sexual activity: Never  Other Topics Concern   Not on file  Social History Narrative   Not on file   Social Drivers of Health   Tobacco Use: Low Risk (10/26/2024)   Patient History    Smoking Tobacco Use: Never    Smokeless Tobacco Use: Never    Passive Exposure: Not on file  Financial Resource Strain: Not on file  Food Insecurity: Not on file  Transportation Needs: Not on file  Physical Activity: Not on file  Stress: Not on file  Social Connections: Not on file  Intimate Partner Violence: Not At Risk (10/13/2024)   Received from [facility]   HITS    Over the last 12 months how often did your partner physically hurt you?: Never    Over the last 12 months how often did your partner insult you or talk down to you?: Never    Over the last 12 months how often did your partner threaten you with physical harm?: Never    Over the last 12 months how often did your partner scream or curse at you?: Never  Depression (PHQ2-9): Not on file  Alcohol  Screen: Not on file  Housing: Not on file  Utilities: Not on file  Health Literacy: Not on file    PHYSICAL EXAM  GENERAL EXAM/CONSTITUTIONAL: Vitals:  Vitals:   10/26/24  1026  BP: 131/73  Pulse: 89  SpO2: 98%  Weight: 149 lb (67.6 kg)  Height: 5' 7 (1.702 m)   Body mass index is 23.34 kg/m. Wt Readings from Last 3 Encounters:  10/26/24 149 lb (67.6 kg)  06/01/17 145 lb (65.8 kg)  05/05/15 160 lb 7.9 oz (72.8 kg)   Patient is in no distress; well developed, nourished and groomed; neck is supple  MUSCULOSKELETAL: Gait, strength, tone, movements noted in Neurologic exam below  NEUROLOGIC: MENTAL STATUS:      No data to display         awake, alert, oriented to person, place and time recent and remote memory intact normal attention and concentration language fluent, comprehension intact, naming intact fund of knowledge  appropriate  CRANIAL NERVE:  2nd, 3rd, 4th, 6th - Visual fields full to confrontation, extraocular muscles intact, no nystagmus 5th - facial sensation symmetric 7th - facial strength symmetric 8th - hearing intact 9th - palate elevates symmetrically, uvula midline 11th - shoulder shrug symmetric 12th - tongue protrusion midline  MOTOR:  normal bulk and tone, full strength in the BUE, BLE  SENSORY:  normal and symmetric to light touch  COORDINATION:  finger-nose-finger, fine finger movements normal  GAIT/STATION:  normal    DIAGNOSTIC DATA (LABS, IMAGING, TESTING) - I reviewed patient records, labs, notes, testing and imaging myself where available.  Lab Results  Component Value Date   WBC 7.8 09/28/2024   HGB 12.0 09/28/2024   HCT 36.4 09/28/2024   MCV 97.3 09/28/2024   PLT 204 09/28/2024      Component Value Date/Time   NA 139 09/28/2024 1413   K 3.7 09/28/2024 1413   CL 104 09/28/2024 1413   CO2 23 09/28/2024 1413   GLUCOSE 97 09/28/2024 1413   BUN 17 09/28/2024 1413   CREATININE 1.05 (H) 09/28/2024 1413   CALCIUM  9.1 09/28/2024 1413   PROT 7.5 09/28/2024 1413   ALBUMIN 4.6 09/28/2024 1413   AST 30 09/28/2024 1413   ALT 21 09/28/2024 1413   ALKPHOS 59 09/28/2024 1413   BILITOT 0.5  09/28/2024 1413   GFRNONAA >60 09/28/2024 1413   GFRAA >60 11/06/2018 1551   No results found for: CHOL, HDL, LDLCALC, LDLDIRECT, TRIG, CHOLHDL Lab Results  Component Value Date   HGBA1C 5.4 04/22/2015   No results found for: VITAMINB12 Lab Results  Component Value Date   TSH 0.156 (L) 05/04/2015    Routine EEG 10/14/2024 Normal EEG recording. A normal EEG does not rule out epilepsy -While a normal EEG does not rule out epilepsy, there was no evidence of an underlying seizure disorder on this study. Clinical correlation is suggested   MRI Brain without contrast 10/14/2024 Normal MRI of the brain without contrast.     ASSESSMENT AND PLAN  53 y.o. year old female with history of anxiety/depression, alcohol  abuse in remission, who is presenting after her first lifetime seizure.  This was in setting of kratom use, (increased dose), Wellbutrin  use 300 mg, caffeine use and dehydration.   First-time generalized seizure Experienced a first-time generalized seizure characterized by stiffness, vocalization, eye rolling, and frothing at the mouth. Occurred in a movie theater with no prior history of seizures. Initial hospital workup, including CT scan and EEG, was normal. Seizure likely provoked by a combination of factors including Kratom use, Wellbutrin , caffeine, and diuretics. Differential diagnosis includes provoked seizure versus unprovoked seizure. A provoked seizure does not require daily medication, but a rescue medication may be considered. If EEG shows abnormalities, further discussion on starting a standing medication will be necessary. - Ordered home EEG for three days to monitor brain activity during sleep and wakefulness. - Provided rescue medication (nasal benzodiazepine) for emergency use. - Advised six-month driving restriction regardless of EEG results. - If EEG shows abnormalities, will discuss starting a standing medication.  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 Attempting to reduce use and plans for a 72-hour medical detox before returning to work. Kratom use may have contributed to the provoked seizure. - Support efforts to reduce Kratom use and complete a medical detox.    1. Seizures (HCC)      Patient Instructions  Continue current medications Encourage patient regarding  reducing her kratom use and going into medical detox Will obtain a 3-day ambulatory EEG, I will contact the patient with updates Please call us  for any additional concerns. Discuss driving restriction for the next 6 months. She voiced understanding.   Orders Placed This Encounter  Procedures   AMBULATORY EEG    Meds ordered this encounter  Medications   Midazolam  (NAYZILAM ) 5 MG/0.1ML SOLN    Sig: Place 5 mg into the nose as needed (seizure).    Dispense:  2 each    Refill:  0    Return if symptoms worsen or fail to improve.    Pastor Falling, MD 10/26/2024, 11:57 AM  [facility] [address] [place], [place] [address] [phone]     [1] No Known Allergies  "

## 2024-10-26 NOTE — Patient Instructions (Addendum)
 Continue current medications Encourage patient regarding reducing her kratom use and going into medical detox Will obtain a 3-day ambulatory EEG, I will contact the patient with updates Please call us  for any additional concerns. Discuss driving restriction for the next 6 months. She voiced understanding.

## 2024-10-26 NOTE — Telephone Encounter (Signed)
 EMAIL SENT TO [facility]:

## 2024-10-27 ENCOUNTER — Other Ambulatory Visit (HOSPITAL_COMMUNITY): Payer: Self-pay

## 2024-10-27 ENCOUNTER — Telehealth: Payer: Self-pay

## 2024-10-27 NOTE — Telephone Encounter (Signed)
 Pharmacy Patient Advocate Encounter   Received notification from [facility] that prior authorization for Nayzilam  is required/requested.   Insurance verification completed.   The patient is insured through [employer].   Per test claim: PA required; PA submitted to above mentioned insurance via Latent Key/confirmation #/EOC [id number] Status is pending

## 2024-10-27 NOTE — Telephone Encounter (Signed)
 PA request has been Submitted. New Encounter has been or will be created for follow up. For additional info see Pharmacy Prior Auth telephone encounter from 10/27/2024.

## 2024-10-30 NOTE — Telephone Encounter (Signed)
 Pharmacy Patient Advocate Encounter  Received notification from [employer] that Prior Authorization for Nayzilam  has been DENIED.  Full denial letter will be uploaded to the media tab. See denial reason below.      PA #/Case ID/Reference #: [id number]

## 2024-12-03 ENCOUNTER — Encounter: Payer: Self-pay | Admitting: Neurology

## 2024-12-03 ENCOUNTER — Other Ambulatory Visit: Payer: Self-pay | Admitting: Neurology

## 2024-12-03 DIAGNOSIS — R569 Unspecified convulsions: Secondary | ICD-10-CM

## 2024-12-03 NOTE — Procedures (Signed)
" ° ° °  Patient Name: Kelsey [name]  MRN: [id number]  Referring Physician/Provider: Gregg   Duration: 22 minutes   CLINICAL HISTORY: 72Y Female w/ h/o anxiety/depression, alcohol  use in remission, who presents after her first lifetime seizure episode. [place] On that day she consumed Kratom, caffeine pills, and diuretics, and did not have her usual water bottle, consuming popcorn instead, which may have contributed to dehydration. Symptoms described as stiff, vocalizing, and frothing at the mouth followed by confusion and disorientation. She was assisted by an off-duty nurse and evaluated at the hospital.   MEDICATIONS:  Wellbutrin , Aspirin , Thiamine  HCl, Potassium, Odansetron, MAX-OX, Multivitamins, Folvite , Ferrous sulfate .   TECHNICAL SUMMARY:  Electrodes are applied using the standard 10-20 international placement protocol. This ambulatory EEG recording was performed utilizing 23 electrode inputs including: (19) cephalic, (1) ground, (1) system reference, and (2) EKG. The EEG recording can be visualized in all standard types of montages for review. The recordings are done at a sampling rate of 200 samples per second, per channel, allowing for relatively high frequency responses of 70 Hz. EEG playbacks are done with digital high frequency filters noted at the top of the page. The EEG is notated with patient events at the direction of the patient by pressing a push button mounted on a waist worn EEG recording device. This EEG was also recorded using high definition digital video.   TECHNOLOGIST NOTE:  No skull defect or scalp edemas were present.   BACKGROUND:  The record was obtained in the awake and drowsy state. No sleep was able to be captured. The posterior awake dominant background activity was a continuous, reactive rhythm of 9 Hz. This was symmetric and well-modulated and attenuated with eye opening. Symmetric diffuse frontocentral  beta range activity was present.   SIGNIFICANT FINDINGS:  There were no apparent asymmetries, focal or generalized abnormalities noted. There were no apparent electrographic or clinical seizures noted by the reviewing Neurodiagnostic Technologist.   PATIENT EVENTS:  A button press or notation was not made during this study.   HYPERVENTILATION:  Hyperventilation was not performed due to COVID-19 contraindications.   PHOTIC STIMULATION:  IPS Was not performed.   EKG:  There were no arrhythmias or abnormalities noted during this recording.   Impression:  This is a normal routine EEG study. No epileptiform activity is recorded in the tracing. Please note a normal EEG does no exclude a diagnosis of epilepsy. Clinical Correlation is recommended.  Pastor Gregg, MD [facility]  "

## 2024-12-03 NOTE — Procedures (Signed)
 "   Patient Name: Kelsey [name]  MRN: [id number]  Referring Physician/Provider: Gregg   Study start date: 11/23/2024 at 315 PM Study end date: 11/26/2024 at 0323 PM Duration: 72 hours    Clinical History:  38Y Female w/ h/o anxiety/depression, alcohol  use in remission, who presents after her first lifetime seizure episode. She [place] On that day she consumed Kratom, caffeine pills, and diuretics, and did not have her usual water bottle, consuming popcorn instead, which may have contributed to dehydration. Symptoms described as stiff, vocalizing, and frothing at the mouth followed by confusion and disorientation. She was assisted by an off-duty nurse and evaluated at the hospital.   INTERMITTENT MONITORING with VIDEO TECHNICAL SUMMARY:  This AVEEG was performed using equipment provided by [employer] utilizing Bluetooth ( [employer] ) amplifiers with continuous EEGT attended video collection using encrypted remote transmission via [employer] secured cellular tower network with data rates for each AVEEG performed. This is a therapist, music AVEEG, obtained, according to the 10-20 international electrode placement system, reformatted digitally into referential and bipolar montages. Data was acquired with a minimum of 21 bipolar connections and sampled at a minimum rate of 250 cycles per second per channel, maximum rate of 450 cycles per second per channel and two channels for EKG. The entire VEEG study was recorded through cable and or radio telemetry for subsequent analysis. Specified epochs of the AVEEG data were identified at the direction of the subject by the depression of a push button by the patient. Each patients event file included data acquired two minutes prior to the push button activation and continuing until two minutes afterwards. AVEEG files were reviewed on [employer] [facility] server, Licensed Software provided by [employer]  with a digital high frequency filter set at 70 Hz and a low frequency filter set at 1 Hz with a paper speed of 72mm/s resulting in 10 seconds per digital page. This entire AVEEG was reviewed by the EEG Technologist. Random time samples, random sleep samples, clips, patient initiated push button files with included patient daily diary logs, EEG Technologist pruned data was reviewed and verified for accuracy and validity by the governing reading neurologist in full details. This AEEGV was fully compliant with all requirements for CPT 97500 for setup, patient education, take down and administered by an EEG technologist.   Long-Term EEG with Video was monitored intermittently by a qualified EEG technologist for the entirety of the recording; quality check-ins were performed at a minimum of every two hours, checking and documenting real-time data and video to assure the integrity and quality of the recording (e.g., camera position, electrode integrity and impedance), and identify the need for maintenance. For intermittent monitoring, an EEG Technologist monitored no more than 12 patients concurrently. Diagnostic video was captured at least 80% of the time during the recording.   PATIENT EVENTS:  A button press or notation was not made during this study.   TECHNOLOGIST EVENTS:  No clear epileptiform activity was detected by the reviewing Neurodiagnostic Technologist.   TIME SAMPLES:  10-minutes of every four hours recorded are reviewed as random time samples.   SLEEP SAMPLES:  5-minutes of every 24 hour recorded sleep cycle are reviewed as random sleep samples.   AWAKE:  At maximal level of alertness, the posterior dominant background activity was continuous, reactive, low voltage rhythm of 10 Hz. This was symmetric, well-modulated, and attenuated with eye opening. Diffuse, symmetric, frontocentral beta range activity was present.  SLEEP:  N1 Sleep (Stage 1) was observed and characterized by the  disappearance of alpha rhythm and the appearance of vertex activity.   N2 Sleep (Stage 2) was observed and characterized by vertex waves, K-complexes, and sleep spindles.   N3 (Stage 3) sleep was observed and characterized by high amplitude Delta activity of 20%.   REM sleep was observed.   EKG:  There were no arrhythmias or abnormalities noted during this recording.   Impression: Normal EEG: awake and asleep    Clinical Correlation: This is a normal 3-day ambulatory EEG tracing. No focal abnormalities or epileptiform discharges were seen. There were no electrographic seizures noted. No events were captured during the recording. Please note a normal EEG does not exclude the diagnosis of epilepsy.   Pastor Falling, MD [facility]  "

## 2024-12-04 ENCOUNTER — Ambulatory Visit: Payer: Self-pay | Admitting: Neurology
